# Patient Record
Sex: Male | Born: 1975
Health system: Southern US, Community
[De-identification: ages and names within clinical notes are randomized; demographics above are authoritative.]

## PROBLEM LIST (undated history)

## (undated) DIAGNOSIS — L409 Psoriasis, unspecified: Secondary | ICD-10-CM

## (undated) DIAGNOSIS — E7800 Pure hypercholesterolemia, unspecified: Secondary | ICD-10-CM

## (undated) DIAGNOSIS — E119 Type 2 diabetes mellitus without complications: Secondary | ICD-10-CM

## (undated) DIAGNOSIS — I2510 Atherosclerotic heart disease of native coronary artery without angina pectoris: Secondary | ICD-10-CM

## (undated) HISTORY — DX: Atherosclerotic heart disease of native coronary artery without angina pectoris: I25.10

## (undated) HISTORY — PX: SHOULDER SURGERY: SHX246

## (undated) HISTORY — DX: Psoriasis, unspecified: L40.9

---

## 2003-07-23 ENCOUNTER — Encounter: Admission: RE | Admit: 2003-07-23 | Discharge: 2003-07-23 | Payer: Self-pay | Admitting: Family Medicine

## 2011-03-25 ENCOUNTER — Ambulatory Visit: Payer: Worker's Compensation

## 2012-07-21 LAB — HM DIABETES EYE EXAM

## 2012-10-31 ENCOUNTER — Other Ambulatory Visit: Payer: Self-pay | Admitting: Occupational Medicine

## 2012-10-31 ENCOUNTER — Ambulatory Visit: Payer: Self-pay

## 2012-10-31 DIAGNOSIS — R52 Pain, unspecified: Secondary | ICD-10-CM

## 2013-07-28 ENCOUNTER — Ambulatory Visit: Payer: Self-pay | Admitting: *Deleted

## 2013-09-22 ENCOUNTER — Ambulatory Visit: Payer: Self-pay | Admitting: *Deleted

## 2013-11-30 ENCOUNTER — Emergency Department (INDEPENDENT_AMBULATORY_CARE_PROVIDER_SITE_OTHER)
Admission: EM | Admit: 2013-11-30 | Discharge: 2013-11-30 | Disposition: A | Discharge status: Home / Self Care | Payer: Worker's Compensation | Source: Home / Self Care | Attending: Family Medicine | Admitting: Family Medicine

## 2013-11-30 ENCOUNTER — Encounter (HOSPITAL_COMMUNITY): Payer: Self-pay | Admitting: Emergency Medicine

## 2013-11-30 ENCOUNTER — Emergency Department (INDEPENDENT_AMBULATORY_CARE_PROVIDER_SITE_OTHER): Payer: Worker's Compensation

## 2013-11-30 DIAGNOSIS — S99929A Unspecified injury of unspecified foot, initial encounter: Secondary | ICD-10-CM

## 2013-11-30 DIAGNOSIS — S99919A Unspecified injury of unspecified ankle, initial encounter: Secondary | ICD-10-CM

## 2013-11-30 DIAGNOSIS — S8990XA Unspecified injury of unspecified lower leg, initial encounter: Secondary | ICD-10-CM

## 2013-11-30 DIAGNOSIS — S8992XA Unspecified injury of left lower leg, initial encounter: Secondary | ICD-10-CM

## 2013-11-30 HISTORY — DX: Pure hypercholesterolemia, unspecified: E78.00

## 2013-11-30 HISTORY — DX: Type 2 diabetes mellitus without complications: E11.9

## 2013-11-30 MED ORDER — DICLOFENAC SODIUM 50 MG PO TBEC: 50.0000 mg | DELAYED_RELEASE_TABLET | Freq: Two times a day (BID) | ORAL | Status: DC | PRN

## 2013-11-30 NOTE — ED Provider Notes (Signed)
Todd Gaines is a 38 y.o. male who presents to Urgent Care today for left knee injury. Patient works for the city of [place] and was at work today when he attempted jump over a creek as part of his normal job duties. He landed awkwardly when the bank of the creek gave way and felt a pop in his left knee.  He continues to have mild pain and swelling of the left knee and a pop when he walks. He denies any locking or catching. He denies any radiating pain weakness or numbness.   Past Medical History  Diagnosis Date  . Diabetes mellitus without complication   . Elevated cholesterol    History  Substance Use Topics  . Smoking status: Never Smoker   . Smokeless tobacco: Not on file  . Alcohol Use: No   ROS as above Medications: No current facility-administered medications for this encounter.   Current Outpatient Prescriptions  Medication Sig Dispense Refill  . Atorvastatin Calcium (LIPITOR PO) Take by mouth.      . Ustekinumab (STELARA [place]) Inject into the skin.      Marland Kitchen diclofenac (VOLTAREN) 50 MG EC tablet Take 1 tablet (50 mg total) by mouth 2 (two) times daily as needed.  30 tablet  0    Exam:  BP 114/76  Pulse 82  Temp(Src) 97.9 F (36.6 C) (Oral)  Resp 18  SpO2 97% Gen: Well NAD HEENT: EOMI,  MMM Lungs: Normal work of breathing. CTABL Heart: RRR no MRG Abd: NABS, Soft. Nondistended, Nontender Exts: Brisk capillary refill, warm and well perfused.  Left knee: Normal-appearing no significant effusion. Range of motion 0-120 1+ right patellar crepitations on extension. Nontender. Stable ligamentous exam to anterior drawer, posterior drawer, valgus and varus stress testing. Negative medial and lateral McMurray's test.   No results found for this or any previous visit (from the past 24 hour(s)). Dg Knee Complete 4 Views Left  11/30/2013   CLINICAL DATA:  KNEE INJURY. The patient jumped over a creek. Pain in the knee. History of diabetes. Patient feels a pop in the knee.   EXAM: LEFT KNEE - COMPLETE 4+ VIEW  COMPARISON:  None.  FINDINGS: Small joint effusion is present. No acute fracture or dislocation. No radiopaque foreign body or soft tissue gas.  IMPRESSION: Small joint effusion.   Electronically Signed   By: Rosalie Gums M.D.   On: 11/30/2013 18:21    Assessment and Plan: 38 y.o. male with left knee injury occurring at work. Contusion versus meniscus injury. Rest watchful waiting followup with occupational health clinic.  Discussed warning signs or symptoms. Please see discharge instructions. Patient expresses understanding.     Rodolph Bong, MD 11/30/13 2014

## 2013-11-30 NOTE — ED Notes (Signed)
Reports trying to jump over creek bed and upon landing grassy area gave way.  States "left leg stayed planted while body fell forward, injuring knee".  Pain felt below knee with weight bearing.

## 2013-11-30 NOTE — Discharge Instructions (Signed)
Thank you for coming in today. Rest ice and elevation.  Take diclofenac as needed for pain Followup with occupational health clinic on Monday or Tuesday.    Knee Effusion The medical term for having fluid in your knee is effusion. This is often due to an internal derangement of the knee. This means something is wrong inside the knee. Some of the causes of fluid in the knee may be torn cartilage, a torn ligament, or bleeding into the joint from an injury. Your knee is likely more difficult to bend and move. This is often because there is increased pain and pressure in the joint. The time it takes for recovery from a knee effusion depends on different factors, including:   Type of injury.  Your age.  Physical and medical conditions.  Rehabilitation Strategies. How long you will be away from your normal activities will depend on what kind of knee problem you have and how much damage is present. Your knee has two types of cartilage. Articular cartilage covers the bone ends and lets your knee bend and move smoothly. Two menisci, thick pads of cartilage that form a rim inside the joint, help absorb shock and stabilize your knee. Ligaments bind the bones together and support your knee joint. Muscles move the joint, help support your knee, and take stress off the joint itself. CAUSES  Often an effusion in the knee is caused by an injury to one of the menisci. This is often a tear in the cartilage. Recovery after a meniscus injury depends on how much meniscus is damaged and whether you have damaged other knee tissue. Small tears may heal on their own with conservative treatment. Conservative means rest, limited weight bearing activity and muscle strengthening exercises. Your recovery may take up to 6 weeks.  TREATMENT  Larger tears may require surgery. Meniscus injuries may be treated during arthroscopy. Arthroscopy is a procedure in which your surgeon uses a small telescope like instrument to look in  your knee. Your caregiver can make a more accurate diagnosis (learning what is wrong) by performing an arthroscopic procedure. If your injury is on the inner margin of the meniscus, your surgeon may trim the meniscus back to a smooth rim. In other cases your surgeon will try to repair a damaged meniscus with stitches (sutures). This may make rehabilitation take longer, but may provide better long term result by helping your knee keep its shock absorption capabilities. Ligaments which are completely torn usually require surgery for repair. HOME CARE INSTRUCTIONS  Use crutches as instructed.  If a brace is applied, use as directed.  Once you are home, an ice pack applied to your swollen knee may help with discomfort and help decrease swelling.  Keep your knee raised (elevated) when you are not up and around or on crutches.  Only take over-the-counter or prescription medicines for pain, discomfort, or fever as directed by your caregiver.  Your caregivers will help with instructions for rehabilitation of your knee. This often includes strengthening exercises.  You may resume a normal diet and activities as directed. SEEK MEDICAL CARE IF:   There is increased swelling in your knee.  You notice redness, swelling, or increasing pain in your knee.  An unexplained oral temperature above 102 F (38.9 C) develops. SEEK IMMEDIATE MEDICAL CARE IF:   You develop a rash.  You have difficulty breathing.  You have any allergic reactions from medications you may have been given.  There is severe pain with any motion of the  knee. MAKE SURE YOU:   Understand these instructions.  Will watch your condition.  Will get help right away if you are not doing well or get worse. Document Released: 05/23/2003 Document Revised: 05/25/2011 Document Reviewed: 07/27/2007 [facility] Patient Information 2015 [place], [place]. This information is not intended to replace advice given to you by your health care  provider. Make sure you discuss any questions you have with your health care provider.

## 2013-12-15 LAB — HM DIABETES EYE EXAM

## 2014-04-18 ENCOUNTER — Ambulatory Visit (INDEPENDENT_AMBULATORY_CARE_PROVIDER_SITE_OTHER): Payer: 59 | Admitting: Family Medicine

## 2014-04-18 ENCOUNTER — Encounter: Payer: Self-pay | Admitting: Family Medicine

## 2014-04-18 ENCOUNTER — Encounter (INDEPENDENT_AMBULATORY_CARE_PROVIDER_SITE_OTHER): Payer: Self-pay

## 2014-04-18 VITALS — BP 120/76 | HR 78 | Temp 97.8°F | Ht 72.5 in | Wt 292.0 lb

## 2014-04-18 DIAGNOSIS — E1165 Type 2 diabetes mellitus with hyperglycemia: Secondary | ICD-10-CM | POA: Insufficient documentation

## 2014-04-18 DIAGNOSIS — E669 Obesity, unspecified: Secondary | ICD-10-CM

## 2014-04-18 DIAGNOSIS — L409 Psoriasis, unspecified: Secondary | ICD-10-CM | POA: Insufficient documentation

## 2014-04-18 DIAGNOSIS — M7552 Bursitis of left shoulder: Secondary | ICD-10-CM

## 2014-04-18 DIAGNOSIS — E785 Hyperlipidemia, unspecified: Secondary | ICD-10-CM

## 2014-04-18 DIAGNOSIS — M25562 Pain in left knee: Secondary | ICD-10-CM

## 2014-04-18 DIAGNOSIS — E1169 Type 2 diabetes mellitus with other specified complication: Secondary | ICD-10-CM | POA: Insufficient documentation

## 2014-04-18 DIAGNOSIS — IMO0002 Reserved for concepts with insufficient information to code with codable children: Secondary | ICD-10-CM | POA: Insufficient documentation

## 2014-04-18 DIAGNOSIS — Z23 Encounter for immunization: Secondary | ICD-10-CM

## 2014-04-18 DIAGNOSIS — E119 Type 2 diabetes mellitus without complications: Secondary | ICD-10-CM

## 2014-04-18 MED ORDER — MELOXICAM 15 MG PO TABS: 15.0000 mg | ORAL_TABLET | Freq: Every day | ORAL | Status: DC | PRN

## 2014-04-18 MED ORDER — LINAGLIPTIN-METFORMIN HCL 2.5-1000 MG PO TABS: 1.0000 | ORAL_TABLET | Freq: Every day | ORAL | Status: DC

## 2014-04-18 NOTE — Patient Instructions (Signed)
Labs today  Flu shot today  Keep working on diet and exercise for weight loss  If you want another evaluation of shoulder and knee pain - see Dr Patsy Lageropland in this office

## 2014-04-18 NOTE — Progress Notes (Signed)
Subjective:    Patient ID: Todd Gaines, male    DOB: [birth year], 39 y.o.   MRN: [id number]  HPI Here to get est for primary care   Went to [facility] med in the past   Has psoriasis  Joints hurt some (hand, ankles and knees)- ? If he has some assoc arthritis  Sees Dr Margo Aye for derm  stelara works fairly for him - gets inj every 3 mo - fairly well controlled  Has had it for 10 years   DM dx 2 years ago -  On jentadueto - which is linagliptin and metformin combined  Also on ace  Did have some microalbumin  Doing well over the past year except for a short period when he was off of it   Usually in 6-7 range   Last A1C was approx oct . --- he sees Dr Tally Joe  Thinks it was in the low 7s He did DM classes / nutritionist   Obese with bmi of 39  Has tried to loose wt - he lost wt down to 260s and then gained it back  Has not been to the gym in 2 mo - after he hurt his shoulder and knee ----has been to [facility] orth[facility] and saw Dr Eulah Pont Dx with bursitis of L shoulder  Also L knee- ? 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 What date  Tdap 2013    There are no active problems to display for this patient.  Past Medical History  Diagnosis Date  . Diabetes mellitus without complication   . Elevated cholesterol    No past surgical history on file. History  Substance Use Topics  . Smoking status: Never Smoker   . Smokeless tobacco: Not on file  . Alcohol Use: No   No family history on file. No Known Allergies Current Outpatient Prescriptions on File Prior to Visit  Medication Sig Dispense  Refill  . Atorvastatin Calcium (LIPITOR PO) Take by mouth.    . diclofenac (VOLTAREN) 50 MG EC tablet Take 1 tablet (50 mg total) by mouth 2 (two) times daily as needed. 30 tablet 0  . Ustekinumab (STELARA [place]) Inject into the skin.     No current facility-administered medications on file prior to visit.      Review of Systems    Review of Systems  Constitutional: Negative for fever, appetite change, fatigue and unexpected weight change.  Eyes: Negative for pain and visual disturbance.  Respiratory: Negative for cough and shortness of breath.   Cardiovascular: Negative for cp or palpitations    Gastrointestinal: Negative for nausea, diarrhea and constipation.  Genitourinary: Negative for urgency and frequency.  Skin: Negative for pallor or rash   MSK pos for L shoulder and L knee pain without swelling Neurological: Negative for weakness, light-headedness, numbness and headaches.  Hematological: Negative for adenopathy. Does not bruise/bleed easily.  Psychiatric/Behavioral: Negative for dysphoric mood. The patient is not nervous/anxious.      Objective:   Physical  Exam  Constitutional: He appears well-developed and well-nourished. No distress.  obese and well appearing   HENT:  Head: Normocephalic and atraumatic.  Right Ear: External ear normal.  Left Ear: External ear normal.  Nose: Nose normal.  Mouth/Throat: Oropharynx is clear and moist.  Eyes: Conjunctivae and EOM are normal. Pupils are equal, round, and reactive to light. Right eye exhibits no discharge. Left eye exhibits no discharge. No scleral icterus.  Neck: Normal range of motion. Neck supple. No JVD present. Carotid bruit is not present. No thyromegaly present.  Cardiovascular: Normal rate, regular rhythm, normal heart sounds and intact distal pulses.  Exam reveals no gallop.   Pulmonary/Chest: Effort normal and breath sounds normal. No respiratory distress. He has no wheezes. He exhibits no tenderness.  Abdominal:  Soft. Bowel sounds are normal. He exhibits no distension, no abdominal bruit and no mass. There is no tenderness.  Musculoskeletal: He exhibits tenderness. He exhibits no edema.  Limited rom of L shoulder-cannot abduct over 90 deg Tender acromion area   Lymphadenopathy:    He has no cervical adenopathy.  Neurological: He is alert. He has normal reflexes. No cranial nerve deficit. He exhibits normal muscle tone. Coordination normal.  Skin: Skin is warm and dry. No rash noted. No erythema. No pallor.  No psoriasis plaques evident today  Psychiatric: He has a normal mood and affect.          Assessment & Plan:   Problem List Items Addressed This Visit      Endocrine   Diabetes type 2, controlled - Primary    Taking over his care-prev went to [facility] Pt does not currently check bs but has been controlled He is not motivated to follow DM diet  Disc low glycemic diet and exercise  Nl foot exam  May need pneumovax, will send for records  Lab today      Relevant Medications   lisinopril (PRINIVIL,ZESTRIL) 10 MG tablet   Linagliptin-Metformin HCl 2.07-998 MG TABS   Other Relevant Orders   Hemoglobin A1c (Completed)     Musculoskeletal and Integument   Bursitis of left shoulder    Pt unsure if he will return to orthopedics rom is moderately limited  He will consider seeing sport med Dr Patsy Lager      Psoriasis    Sees dermatology Currently on stelara and doing well         Other   Hyperlipidemia    Lab today On atorvastatin  Diet is fair Disc goals for diabetic       Relevant Medications   lisinopril (PRINIVIL,ZESTRIL) 10 MG tablet   Other Relevant Orders   Comprehensive metabolic panel (Completed)   Lipid panel (Completed)   Left knee pain    Sounds like ? Patellofemoral synd Wt loss would help -also PT  He may see Dr Patsy Lager for this       Obesity    Discussed how this problem influences overall health and the risks it imposes  Reviewed plan for weight loss  with lower calorie diet (via better food choices and also portion control or program like weight watchers) and exercise building up to or more than 30 minutes 5 days per week including some aerobic activity         Relevant Medications   Linagliptin-Metformin HCl 2.07-998 MG TABS    Other Visit Diagnoses    Flu vaccine need        Relevant Orders    Flu Vaccine QUAD 36+ mos IM (  Completed)

## 2014-04-18 NOTE — Progress Notes (Signed)
Pre visit review using our clinic review tool, if applicable. No additional management support is needed unless otherwise documented below in the visit note. 

## 2014-04-19 DIAGNOSIS — E669 Obesity, unspecified: Secondary | ICD-10-CM | POA: Insufficient documentation

## 2014-04-19 LAB — COMPREHENSIVE METABOLIC PANEL
ALT: 10 U/L (ref 0–53)
AST: 11 U/L (ref 0–37)
Albumin: 4 g/dL (ref 3.5–5.2)
Alkaline Phosphatase: 88 U/L (ref 39–117)
BUN: 13 mg/dL (ref 6–23)
CO2: 28 mEq/L (ref 19–32)
Calcium: 9.2 mg/dL (ref 8.4–10.5)
Chloride: 98 mEq/L (ref 96–112)
Creatinine, Ser: 0.99 mg/dL (ref 0.40–1.50)
GFR: 89.54 mL/min (ref >60.00)
Glucose, Bld: 317 mg/dL — ABNORMAL HIGH (ref 70–99)
Potassium: 5.1 mEq/L (ref 3.5–5.1)
Sodium: 134 mEq/L — ABNORMAL LOW (ref 135–145)
Total Bilirubin: 0.4 mg/dL (ref 0.2–1.2)
Total Protein: 6.6 g/dL (ref 6.0–8.3)

## 2014-04-19 LAB — LIPID PANEL
Cholesterol: 104 mg/dL (ref 0–200)
HDL: 27.2 mg/dL — ABNORMAL LOW (ref >39.00)
LDL Cholesterol: 42 mg/dL (ref 0–99)
NonHDL: 76.8
Total CHOL/HDL Ratio: 4
Triglycerides: 172 mg/dL — ABNORMAL HIGH (ref 0.0–149.0)
VLDL: 34.4 mg/dL (ref 0.0–40.0)

## 2014-04-19 LAB — HEMOGLOBIN A1C: Hgb A1c MFr Bld: 11 % — ABNORMAL HIGH (ref 4.6–6.5)

## 2014-04-19 NOTE — Assessment & Plan Note (Signed)
Pt unsure if he will return to orthopedics rom is moderately limited  He will consider seeing sport med Dr Patsy Lageropland

## 2014-04-19 NOTE — Assessment & Plan Note (Signed)
Discussed how this problem influences overall health and the risks it imposes  Reviewed plan for weight loss with lower calorie diet (via better food choices and also portion control or program like weight watchers) and exercise building up to or more than 30 minutes 5 days per week including some aerobic activity    

## 2014-04-19 NOTE — Assessment & Plan Note (Signed)
Sounds like ? Patellofemoral synd Wt loss would help -also PT  He may see Dr Patsy Lageropland for this

## 2014-04-19 NOTE — Assessment & Plan Note (Signed)
Sees dermatology Currently on stelara and doing well

## 2014-04-19 NOTE — Assessment & Plan Note (Signed)
Taking over his care-prev went to [facility] Pt does not currently check bs but has been controlled He is not motivated to follow DM diet  Disc low glycemic diet and exercise  Nl foot exam  May need pneumovax, will send for records  Lab today

## 2014-04-19 NOTE — Assessment & Plan Note (Signed)
Lab today On atorvastatin  Diet is fair Disc goals for diabetic

## 2014-04-20 ENCOUNTER — Telehealth: Payer: Self-pay | Admitting: Family Medicine

## 2014-04-20 NOTE — Telephone Encounter (Signed)
Pt stopped by and stated he was seen earlier in the week and his work requires a drs note. Please advise. Thank you!

## 2014-04-20 NOTE — Telephone Encounter (Signed)
Notified patient and note left in front office.

## 2014-04-20 NOTE — Telephone Encounter (Signed)
Note done and in IN box 

## 2014-04-23 ENCOUNTER — Encounter: Payer: Self-pay | Admitting: *Deleted

## 2014-04-26 ENCOUNTER — Other Ambulatory Visit: Payer: Self-pay

## 2014-04-26 MED ORDER — ONETOUCH ULTRA 2 W/DEVICE KIT: PACK | Status: DC

## 2014-04-26 MED ORDER — ONETOUCH DELICA LANCETS FINE MISC: Status: DC

## 2014-04-26 MED ORDER — GLUCOSE BLOOD VI STRP: ORAL_STRIP | Status: DC

## 2014-04-26 NOTE — Telephone Encounter (Signed)
Mr Todd Gaines has old one touch diabetic meter and pt request new one touch 2 meter, test strips and lancets to [facility] [place]. Advised done.

## 2014-06-25 ENCOUNTER — Other Ambulatory Visit: Payer: Self-pay

## 2014-06-25 NOTE — Telephone Encounter (Signed)
Pt request refill on lisinopril 10 mg and atorvastatin 10 mg taking one daily to [facility] ; Dr Milinda Antisower has not filled before; pt last seen 04/18/14.Marland Kitchen. Pt also request linagliptin-metformin and meloxicam to [facility] but spoke with [facility] and have refills for these 2 meds and will get ready for pickup.

## 2014-06-25 NOTE — Telephone Encounter (Signed)
He needs a f/u - his last A1C was 11 and we were unable to get a hold of him to follow up for this -- see note that went with last lab  Perhaps the pharmacy knows a better number to reach him Please refill the lisinopril and atorvastatin times one and schedule f/u Thanks

## 2014-06-26 MED ORDER — LISINOPRIL 10 MG PO TABS: 10.0000 mg | ORAL_TABLET | Freq: Every day | ORAL | Status: DC

## 2014-07-25 ENCOUNTER — Encounter: Payer: Self-pay | Admitting: Family Medicine

## 2014-07-25 ENCOUNTER — Ambulatory Visit (INDEPENDENT_AMBULATORY_CARE_PROVIDER_SITE_OTHER): Payer: 59 | Admitting: Family Medicine

## 2014-07-25 VITALS — BP 120/70 | HR 83 | Temp 97.9°F | Ht 72.5 in | Wt 292.5 lb

## 2014-07-25 DIAGNOSIS — M7502 Adhesive capsulitis of left shoulder: Secondary | ICD-10-CM

## 2014-07-25 NOTE — Progress Notes (Signed)
Pre visit review using our clinic review tool, if applicable. No additional management support is needed unless otherwise documented below in the visit note. 

## 2014-07-25 NOTE — Progress Notes (Signed)
Dr. Frederico Hamman T. Carmisha Larusso, MD, [facility] [place], [address] Phone: [phone] Fax: [phone]  07/25/2014  Patient: Todd Gaines, MRN: [id number], DOB: 26-May-1975, 39 y.o.  Primary Physician:  Loura Pardon, MD  Chief Complaint: Shoulder Pain  Subjective:   Todd Gaines is a 39 y.o. very pleasant male patient who presents with the following: shoulder pain  Consulting MD: Dr. Glori Bickers Reason: L shoulder pain  The patient noted above presents with L shoulder pain that has been ongoing for 1 year there is no history of trauma or accident. The patient denies neck pain or radicular symptoms. No shoulder blade pain Denies dislocation, subluxation, separation of the shoulder. The patient does complain of pain with flexion, abduction, and terminal motion.  Significant restriction of motion. he describes a deep ache around the shoulder, and sometimes it will wake the patient up at night.  Medications Tried: Mobic, Shoulder injection Ice or Heat: minimal help Tried PT: No  Prior shoulder Injury: No Prior surgery: No Prior fracture: No  Last year, the patient saw Dr. Percell Miller, and he thought that the patient had subacromial bursitis, did a subacromial injection and gave him some Mobic. The patient had relief of symptoms for 2 days.  Past Medical History, Surgical History, Social History, Family History, Medications, and allergies reviewed and updated if relevant.   GEN: No fevers, chills. Nontoxic. Primarily MSK c/o today. MSK: Detailed in the HPI GI: tolerating PO intake without difficulty Neuro: No numbness, parasthesias, or tingling associated. Otherwise the pertinent positives of the ROS are noted above.    Objective:   Blood pressure 120/70, pulse 83, temperature 97.9 F (36.6 C), temperature source Oral, height 6' 0.5" (1.842 m), weight 292 lb 8 oz (132.677 kg).  GEN: Well-developed,well-nourished,in no acute  distress; alert,appropriate and cooperative throughout examination HEENT: Normocephalic and atraumatic without obvious abnormalities. Ears, externally no deformities PULM: Breathing comfortably in no respiratory distress EXT: No clubbing, cyanosis, or edema PSYCH: Normally interactive. Cooperative during the interview. Pleasant. Friendly and conversant. Not anxious or depressed appearing. Normal, full affect.  Shoulder: L Inspection: No muscle wasting or winging Ecchymosis/edema: neg  AC joint, scapula, clavicle: NT Cervical spine: NT, full ROM Spurling's: neg Abduction: 5/5, Loss of 15 deg Flexion: 5/5, loss of 10 deg IR, full, lift-off: 5/5, loss of 65 deg ER at neutral: 5/5, loss of 20 deg AC crossover and compression: unable to complete Additional special testing is equivocal given lack of motion C5-T1 intact Sensation intact Grip 5/5  Assessment and Plan:   Adhesive capsulitis of left shoulder  Primary driver of pain now is frozen shoulder. He likely did have an initial bursitis with rotator cuff tendinopathy and then developed a secondary frozen shoulder.  Patient was given a systematic ROM protocol from [employer] to be done daily. Emphasized importance of adherence.  The average length of total symptoms is 12 months going through 3 different phases in the freezing and thawing process. Reviewed all with patient.   Tylenol or NSAID of choice prn for pain relief Intraarticular shoulder injections discussed with patient, which have good evidence for accelerating the thawing phase, but given age and prior injection, I would hold off for now..  Will need RTC str and scapular stabilization to fix underlying mechanics.  I appreciate the opportunity to evaluate this very friendly patient. If you have any question regarding her care or prognosis, do not hesitate to ask.   Follow-up: 2 mo  Signed,  Maud Deed. Juell Radney, MD   Patient's Medications  New Prescriptions   No  medications on file  Previous Medications   ATORVASTATIN CALCIUM (LIPITOR PO)    Take by mouth.   BLOOD GLUCOSE MONITORING SUPPL (ONE TOUCH ULTRA 2) W/DEVICE KIT    Check fasting blood sugar and 2 hour post prandial BS in afternoon or evening. E11.9.   GLUCOSE BLOOD TEST STRIP    Check fasting blood sugar and 2 hour post prandial in afternoon or evening. Dx E11.9.   LINAGLIPTIN-METFORMIN HCL 2.07-998 MG TABS    Take 1 tablet by mouth daily.   LISINOPRIL (PRINIVIL,ZESTRIL) 10 MG TABLET    Take 1 tablet (10 mg total) by mouth daily.   MELOXICAM (MOBIC) 15 MG TABLET    Take 1 tablet (15 mg total) by mouth daily as needed for pain (with food).   ONETOUCH DELICA LANCETS FINE MISC    Check fasting blood sugar and 2 hour post prandial in afternoon or evening. E11.9   USTEKINUMAB (STELARA [place])    Inject into the skin.  Modified Medications   No medications on file  Discontinued Medications   No medications on file

## 2014-07-26 ENCOUNTER — Ambulatory Visit: Payer: Self-pay | Admitting: Family Medicine

## 2014-09-10 ENCOUNTER — Ambulatory Visit (INDEPENDENT_AMBULATORY_CARE_PROVIDER_SITE_OTHER): Payer: Commercial Managed Care - HMO | Admitting: Family Medicine

## 2014-09-10 ENCOUNTER — Telehealth: Payer: Self-pay | Admitting: Family Medicine

## 2014-09-10 ENCOUNTER — Encounter: Payer: Self-pay | Admitting: Family Medicine

## 2014-09-10 ENCOUNTER — Encounter: Payer: Self-pay | Admitting: *Deleted

## 2014-09-10 VITALS — BP 118/70 | HR 89 | Temp 97.5°F | Ht 72.5 in | Wt 287.0 lb

## 2014-09-10 DIAGNOSIS — M542 Cervicalgia: Secondary | ICD-10-CM

## 2014-09-10 DIAGNOSIS — E119 Type 2 diabetes mellitus without complications: Secondary | ICD-10-CM | POA: Diagnosis not present

## 2014-09-10 LAB — HEMOGLOBIN A1C: Hgb A1c MFr Bld: 9 % — ABNORMAL HIGH (ref 4.6–6.5)

## 2014-09-10 MED ORDER — TIZANIDINE HCL 4 MG PO TABS: 4.0000 mg | ORAL_TABLET | Freq: Every evening | ORAL | Status: DC

## 2014-09-10 NOTE — Progress Notes (Signed)
Dr. Frederico Hamman T. Massey Ruhland, MD, [facility] [place], [address] Phone: [phone] Fax: [phone]  09/10/2014  Patient: Todd Gaines, MRN: [id number], DOB: 11/11/1975, 39 y.o.  Primary Physician:  Loura Pardon, MD  Chief Complaint: Neck Pain  Subjective:   Todd Gaines is a 39 y.o. very pleasant male patient who presents with the following:  Pleasant 39 year old patient of Dr. Marliss Coots, who I'm seeing for cervicalgia, left posterior neck pain as well as shoulder blade pain today acutely.  I saw him previously for frozen shoulder on the left, and this is improved dramatically since I previously saw him.  Now is having pain in the posterior left side of his neck musculature, and having some mild restriction in range of motion.  He also is having some pain in and around the shoulder blade and parascapular region on the left.  He is not having any numbness or tingling radiating down his left arm.  Poorly controlled diabetic.  Due for his A1c today.  Unsure how and thinks tht strained something and pain and posterior shoulder.   Frozen shoulder is improving a lot.  (LEFT)  Neck posterior, L shoulder blade  Past Medical History, Surgical History, Social History, Family History, Problem List, Medications, and Allergies have been reviewed and updated if relevant.  Patient Active Problem List   Diagnosis Date Noted  . Obesity 04/19/2014  . Diabetes type 2, controlled 04/18/2014  . Hyperlipidemia 04/18/2014  . Psoriasis 04/18/2014  . Bursitis of left shoulder 04/18/2014  . Left knee pain 04/18/2014    Past Medical History  Diagnosis Date  . Diabetes mellitus without complication   . Elevated cholesterol     No past surgical history on file.  History   Social History  . Marital Status: Married    Spouse Name: N/A  . Number of Children: N/A  . Years of Education: N/A   Occupational History  . Not on file.    Social History Main Topics  . Smoking status: Former Research scientist (life sciences)  . Smokeless tobacco: Never Used  . Alcohol Use: No  . Drug Use: No  . Sexual Activity: Yes   Other Topics Concern  . Not on file   Social History Narrative    No family history on file.  No Known Allergies  Medication list reviewed and updated in full in [facility].   GEN: No acute illnesses, no fevers, chills. GI: No n/v/d, eating normally Pulm: No SOB Interactive and getting along well at home.  Otherwise, ROS is as per the HPI.  Objective:   BP 118/70 mmHg  Pulse 89  Temp(Src) 97.5 F (36.4 C) (Oral)  Ht 6' 0.5" (1.842 m)  Wt 287 lb (130.182 kg)  BMI 38.37 kg/m2  GEN: WDWN, NAD, Non-toxic, A & O x 3 HEENT: Atraumatic, Normocephalic. Neck supple. No masses, No LAD. Ears and Nose: No external deformity. CV: RRR, No M/G/R. No JVD. No thrill. No extra heart sounds. PULM: CTA B, no wheezes, crackles, rhonchi. No retractions. No resp. distress. No accessory muscle use. EXTR: No c/c/e NEURO Normal gait.  PSYCH: Normally interactive. Conversant. Not depressed or anxious appearing.  Calm demeanor.   CERVICAL SPINE EXAM Range of motion: Flexion, extension, lateral bending, and rotation: mild restriction Pain with terminal motion: yes Spinous Processes: NT SCM: NT Upper paracervical muscles: ttp on L Upper traps: TTP on L C5-T1 intact, sensation and motor   Laboratory and Imaging  Data: Results for orders placed or performed in visit on 09/10/14  Hemoglobin A1c  Result Value Ref Range   Hgb A1c MFr Bld 9.0 (H) 4.6 - 6.5 %     Assessment and Plan:   Cervicalgia  Diabetes type 2, controlled - Plan: Hemoglobin A1c  Referred neck pain with posterior neck pain, muscle tightness, trapezius spasm and referred pain to the shoulder blade all on the left.  Heat, gentle range of motion, and given neck range of motion and rehabilitation protocol.  Continue NSAIDs, and at Zanaflex at  nighttime.  A1c is at 9, he is supposed to have follow-up with his primary care doctor soon.  Follow-up: No Follow-up on file.  New Prescriptions   ATORVASTATIN (LIPITOR) 10 MG TABLET    Take 1 tablet (10 mg total) by mouth daily.   TIZANIDINE (ZANAFLEX) 4 MG TABLET    Take 1 tablet (4 mg total) by mouth Nightly.   Orders Placed This Encounter  Procedures  . Hemoglobin A1c    Signed,  Keyana Guevara T. Viet Kemmerer, MD   Patient's Medications  New Prescriptions   ATORVASTATIN (LIPITOR) 10 MG TABLET    Take 1 tablet (10 mg total) by mouth daily.   TIZANIDINE (ZANAFLEX) 4 MG TABLET    Take 1 tablet (4 mg total) by mouth Nightly.  Previous Medications   BLOOD GLUCOSE MONITORING SUPPL (ONE TOUCH ULTRA 2) W/DEVICE KIT    Check fasting blood sugar and 2 hour post prandial BS in afternoon or evening. E11.9.   GLUCOSE BLOOD TEST STRIP    Check fasting blood sugar and 2 hour post prandial in afternoon or evening. Dx E11.9.   LISINOPRIL (PRINIVIL,ZESTRIL) 10 MG TABLET    Take 1 tablet (10 mg total) by mouth daily.   ONETOUCH DELICA LANCETS FINE MISC    Check fasting blood sugar and 2 hour post prandial in afternoon or evening. E11.9   USTEKINUMAB (STELARA [place])    Inject into the skin.  Modified Medications   Modified Medication Previous Medication   LINAGLIPTIN-METFORMIN HCL 2.07-998 MG TABS Linagliptin-Metformin HCl 2.07-998 MG TABS      Take 1 tablet by mouth 2 (two) times daily.    Take 1 tablet by mouth daily.   MELOXICAM (MOBIC) 15 MG TABLET meloxicam (MOBIC) 15 MG tablet      Take 1 tablet (15 mg total) by mouth daily as needed for pain (with food).    Take 1 tablet (15 mg total) by mouth daily as needed for pain (with food).  Discontinued Medications   ATORVASTATIN CALCIUM (LIPITOR PO)    Take by mouth.

## 2014-09-10 NOTE — Progress Notes (Signed)
Pre visit review using our clinic review tool, if applicable. No additional management support is needed unless otherwise documented below in the visit note. 

## 2014-09-10 NOTE — Telephone Encounter (Signed)
Pt has 1 month supply on meds and it is cheaper with insurance to get 3 month supply using the same drug store. [facility] [place] to 3 month supply each.  Call back [phone]

## 2014-09-11 MED ORDER — MELOXICAM 15 MG PO TABS: 15.0000 mg | ORAL_TABLET | Freq: Every day | ORAL | Status: DC | PRN

## 2014-09-11 MED ORDER — LINAGLIPTIN-METFORMIN HCL 2.5-1000 MG PO TABS: 1.0000 | ORAL_TABLET | Freq: Two times a day (BID) | ORAL | Status: DC

## 2014-09-11 MED ORDER — ATORVASTATIN CALCIUM 10 MG PO TABS: 10.0000 mg | ORAL_TABLET | Freq: Every day | ORAL | Status: DC

## 2014-09-11 NOTE — Telephone Encounter (Signed)
meds sent in as 3 month supply per pt request and pharmacy advise to cancel the 30 day supply Rx on file

## 2014-09-26 ENCOUNTER — Ambulatory Visit: Payer: Commercial Managed Care - HMO | Admitting: Family Medicine

## 2014-09-28 ENCOUNTER — Ambulatory Visit (INDEPENDENT_AMBULATORY_CARE_PROVIDER_SITE_OTHER): Payer: Commercial Managed Care - HMO | Admitting: Family Medicine

## 2014-09-28 ENCOUNTER — Encounter: Payer: Self-pay | Admitting: Family Medicine

## 2014-09-28 VITALS — BP 100/70 | HR 94 | Temp 97.8°F | Wt 285.4 lb

## 2014-09-28 DIAGNOSIS — E669 Obesity, unspecified: Secondary | ICD-10-CM | POA: Diagnosis not present

## 2014-09-28 DIAGNOSIS — Z23 Encounter for immunization: Secondary | ICD-10-CM

## 2014-09-28 DIAGNOSIS — E119 Type 2 diabetes mellitus without complications: Secondary | ICD-10-CM

## 2014-09-28 NOTE — Patient Instructions (Signed)
Diabetes is improved (good job so far)  Keep working on diet and exercise and weight loss the best you can  Stop at check out for referral to endcrinology  Pneumonia vaccine today   A1C is 9.0- goal is below 7   I'm not sure when your glucose levels spike- check it at different times   Pneumonia vaccine today

## 2014-09-28 NOTE — Assessment & Plan Note (Signed)
Improved but not controlled  A1C is 9 - down from 11  He is making more of a lifestyle effort  Currently on Trajenta-metformin and tolerates this  Rev low glycemic diet He would like to avoid insulin if poss Ref to endocrinology for help with management

## 2014-09-28 NOTE — Progress Notes (Signed)
Subjective:    Patient ID: Todd Gaines, male    DOB: February 15, 1976, 39 y.o.   MRN: [id number]  HPI Here for f/u of DM  Wt is down 2 lb with bmi of 38   Diabetes Home sugar results - checks in am and sometimes in afternoons  Usually mid 150-mid 180s DM diet -not motivated to change her diet (has been a little better with it) - has been to DM teaching  Exercise - he is attempting to start back with this -getting back to the gym (4-5 d per week -then his daughter started travel softball and schedule went haywire) Symptoms A1C last  Lab Results  Component Value Date   HGBA1C 9.0* 09/10/2014  it was 11, so this is improved   No problems with medications - he takes combo of Tradjenta -metformin (does not miss doses) - no side effects or problems  Has not been on injectable meds or insulin  Renal protection -on ace  Last eye exam -fall 2015- October , no retinopathy    Needs pneumovax -  BP Readings from Last 3 Encounters:  09/28/14 100/70  09/10/14 118/70  07/25/14 120/70     Patient Active Problem List   Diagnosis Date Noted  . Obesity 04/19/2014  . Diabetes type 2, controlled 04/18/2014  . Hyperlipidemia 04/18/2014  . Psoriasis 04/18/2014  . Bursitis of left shoulder 04/18/2014  . Left knee pain 04/18/2014   Past Medical History  Diagnosis Date  . Diabetes mellitus without complication   . Elevated cholesterol    No past surgical history on file. History  Substance Use Topics  . Smoking status: Former Games developer  . Smokeless tobacco: Never Used  . Alcohol Use: No   No family history on file. No Known Allergies Current Outpatient Prescriptions on File Prior to Visit  Medication Sig Dispense Refill  . atorvastatin (LIPITOR) 10 MG tablet Take 1 tablet (10 mg total) by mouth daily. 90 tablet 2  . Blood Glucose Monitoring Suppl (ONE TOUCH ULTRA 2) W/DEVICE KIT Check fasting blood sugar and 2 hour post prandial BS in afternoon or evening. E11.9. 1 each 0  . glucose  blood test strip Check fasting blood sugar and 2 hour post prandial in afternoon or evening. Dx E11.9. 100 each 5  . Linagliptin-Metformin HCl 2.07-998 MG TABS Take 1 tablet by mouth 2 (two) times daily. 180 tablet 2  . lisinopril (PRINIVIL,ZESTRIL) 10 MG tablet Take 1 tablet (10 mg total) by mouth daily. 90 tablet 3  . meloxicam (MOBIC) 15 MG tablet Take 1 tablet (15 mg total) by mouth daily as needed for pain (with food). 90 tablet 2  . ONETOUCH DELICA LANCETS FINE MISC Check fasting blood sugar and 2 hour post prandial in afternoon or evening. E11.9 100 each 5  . tiZANidine (ZANAFLEX) 4 MG tablet Take 1 tablet (4 mg total) by mouth Nightly. 30 tablet 2  . Ustekinumab (STELARA [place]) Inject into the skin.     No current facility-administered medications on file prior to visit.    Review of Systems    Review of Systems  Constitutional: Negative for fever, appetite change, fatigue and unexpected weight change.  Eyes: Negative for pain and visual disturbance.  Respiratory: Negative for cough and shortness of breath.   Cardiovascular: Negative for cp or palpitations    Gastrointestinal: Negative for nausea, diarrhea and constipation.  Genitourinary: Negative for urgency and frequency. pos for big water consumption/thirst  Skin: Negative for pallor or rash  Neurological: Negative for weakness, light-headedness, numbness and headaches.  Hematological: Negative for adenopathy. Does not bruise/bleed easily.  Psychiatric/Behavioral: Negative for dysphoric mood. The patient is not nervous/anxious.      Objective:   Physical Exam  Constitutional: He appears well-developed and well-nourished. No distress.  obese and well appearing   HENT:  Head: Normocephalic and atraumatic.  Mouth/Throat: Oropharynx is clear and moist.  Eyes: Conjunctivae and EOM are normal. Pupils are equal, round, and reactive to light.  Neck: Normal range of motion. Neck supple. No JVD present. Carotid bruit is not present.  No thyromegaly present.  Cardiovascular: Normal rate, regular rhythm, normal heart sounds and intact distal pulses.  Exam reveals no gallop.   Pulmonary/Chest: Effort normal and breath sounds normal. No respiratory distress. He has no wheezes. He has no rales.  No crackles  Abdominal: Soft. Bowel sounds are normal. He exhibits no distension, no abdominal bruit and no mass. There is no tenderness.  Musculoskeletal: He exhibits no edema.  Lymphadenopathy:    He has no cervical adenopathy.  Neurological: He is alert. He has normal reflexes.  Skin: Skin is warm and dry. No erythema.  Psychiatric: He has a normal mood and affect.          Assessment & Plan:   Problem List Items Addressed This Visit    Diabetes type 2, controlled - Primary    Improved but not controlled  A1C is 9 - down from 11  He is making more of a lifestyle effort  Currently on Trajenta-metformin and tolerates this  Rev low glycemic diet He would like to avoid insulin if poss Ref to endocrinology for help with management       Relevant Orders   Ambulatory referral to Endocrinology   Obesity    Discussed how this problem influences overall health and the risks it imposes  Reviewed plan for weight loss with lower calorie diet (via better food choices and also portion control or program like weight watchers) and exercise building up to or more than 30 minutes 5 days per week including some aerobic activity          Other Visit Diagnoses    Need for 23-polyvalent pneumococcal polysaccharide vaccine        Relevant Orders    Pneumococcal polysaccharide vaccine 23-valent greater than or equal to 2yo subcutaneous/IM (Completed)

## 2014-09-28 NOTE — Progress Notes (Signed)
Pre visit review using our clinic review tool, if applicable. No additional management support is needed unless otherwise documented below in the visit note. 

## 2014-09-30 NOTE — Assessment & Plan Note (Signed)
Discussed how this problem influences overall health and the risks it imposes  Reviewed plan for weight loss with lower calorie diet (via better food choices and also portion control or program like weight watchers) and exercise building up to or more than 30 minutes 5 days per week including some aerobic activity    

## 2014-11-14 ENCOUNTER — Other Ambulatory Visit: Payer: Self-pay | Admitting: *Deleted

## 2014-11-14 ENCOUNTER — Encounter: Payer: Self-pay | Admitting: Endocrinology

## 2014-11-14 ENCOUNTER — Ambulatory Visit (INDEPENDENT_AMBULATORY_CARE_PROVIDER_SITE_OTHER): Payer: Commercial Managed Care - HMO | Admitting: Endocrinology

## 2014-11-14 VITALS — BP 144/84 | HR 92 | Temp 97.9°F | Resp 16 | Ht 72.0 in | Wt 287.8 lb

## 2014-11-14 DIAGNOSIS — E119 Type 2 diabetes mellitus without complications: Secondary | ICD-10-CM

## 2014-11-14 DIAGNOSIS — E785 Hyperlipidemia, unspecified: Secondary | ICD-10-CM

## 2014-11-14 LAB — POCT GLUCOSE (DEVICE FOR HOME USE): Glucose Fasting, POC: 368 mg/dL — AB (ref 70–99)

## 2014-11-14 MED ORDER — ALBIGLUTIDE 30 MG ~~LOC~~ PEN: PEN_INJECTOR | SUBCUTANEOUS | Status: DC

## 2014-11-14 NOTE — Patient Instructions (Addendum)
Check blood sugars on waking up .. 3 .. times a week Also check blood sugars about 2 hours after a meal and do this after different meals by rotation  Recommended blood sugar levels on waking up is 90-130 and about 2 hours after meal is 140-180 Please bring blood sugar monitor to each visit.  Tanzeum weekly, may cause a little nausea  Exercise regular  Avoid hi fat meals

## 2014-11-14 NOTE — Progress Notes (Signed)
Patient ID: Todd Gaines, male   DOB: [birth year], 39 y.o.   MRN: [id number]           Reason for Appointment: Consultation for Type 2 Diabetes  Referring physician: [facility]  History of Present Illness:          Date of diagnosis of type 2 diabetes mellitus: 2012       Background history:   He believes her A1c was 12% when he was first diagnosed to have diabetes and was having symptoms of feeling very tired. Initially he made changes in his lifestyle and was able to lose some weight also He was probably treated with metformin and may have been given Tradjenta subsequently Previous records are not available He thinks his A1c had been around 7-7.5% for some time until last year  Recent history:   Higher blood sugars have been present for the last 1 year and his A1c has been as high as 11%  He thinks his blood sugars started going up when he was starting to do some night shifts.    His last A1c was 9% in 6/16  Current blood sugar patterns and problems identified:  He is checking his blood sugars primarily in the morning and these are not very high   He does not watch his diet consistently and frequently is eating high-fat meals including pizza and biscuits; blood sugar is 368 today after eating a biscuit  Although he is fairly active at work unless the weather is not conducive he does not do any formal exercise and he says he does not find the time to do this.    Has not been able to lose any weight recently  Oral hypoglycemic drugs the patient is taking are: Jentadueto twice a day     Side effects from medications have been:none  Compliance with the medical regimen: poor    Glucose monitoring:  done  times a day         Glucometer: One Touch.      Blood Glucose readings recent fasting range 150-175, usually <200  Self-care: The diet that the patient has been following is: none, not motivated Typical meal intake: Breakfast is biscuit or eggs and bacon/grits.  Snacks will be  peanut butter crackers, chips, pretzels and peanut butter sweets              Dietician visit, most recent:2014               Exercise: Irregular   Weight history: 275-298 lbs  Wt Readings from Last 3 Encounters:  11/14/14 287 lb 12.8 oz (130.545 kg)  09/28/14 285 lb 6.4 oz (129.457 kg)  09/10/14 287 lb (130.182 kg)    Glycemic control:   Lab Results  Component Value Date   HGBA1C 9.0* 09/10/2014   HGBA1C 11.0* 04/18/2014   Lab Results  Component Value Date   LDLCALC 42 04/18/2014   CREATININE 0.99 04/18/2014         Medication List       This list is accurate as of: 11/14/14 12:44 PM.  Always use your most recent med list.               atorvastatin 10 MG tablet  Commonly known as:  LIPITOR  Take 1 tablet (10 mg total) by mouth daily.     glucose blood test strip  Check fasting blood sugar and 2 hour post prandial in afternoon or evening. Dx E11.9.     Linagliptin-Metformin  HCl 2.07-998 MG Tabs  Take 1 tablet by mouth 2 (two) times daily.     lisinopril 10 MG tablet  Commonly known as:  PRINIVIL,ZESTRIL  Take 1 tablet (10 mg total) by mouth daily.     meloxicam 15 MG tablet  Commonly known as:  MOBIC  Take 1 tablet (15 mg total) by mouth daily as needed for pain (with food).     ONE TOUCH ULTRA 2 W/DEVICE Kit  Check fasting blood sugar and 2 hour post prandial BS in afternoon or evening. E11.9.     ONETOUCH DELICA LANCETS FINE Misc  Check fasting blood sugar and 2 hour post prandial in afternoon or evening. E11.9     STELARA [place]  Inject into the skin.     tiZANidine 4 MG tablet  Commonly known as:  ZANAFLEX  Take 1 tablet (4 mg total) by mouth Nightly.        Allergies: No Known Allergies  Past Medical History  Diagnosis Date  . Diabetes mellitus without complication   . Elevated cholesterol   . Psoriasis     History reviewed. No pertinent past surgical history.  Family History  Problem Relation Age of Onset  . Diabetes Mother   .  Diabetes Paternal Grandfather   . Hypertension Paternal Grandfather   . Heart disease Neg Hx   . Cancer Neg Hx     Social History:  reports that he has quit smoking. He has never used smokeless tobacco. He reports that he does not drink alcohol or use illicit drugs.    Review of Systems    Lipid history: On Rx 3-4    Lab Results  Component Value Date   CHOL 104 04/18/2014   HDL 27.20* 04/18/2014   LDLCALC 42 04/18/2014   TRIG 172.0* 04/18/2014   CHOLHDL 4 04/18/2014           Constitutional: no recent weight gain/loss, no complaints of unusual fatigue   Eyes: no history of blurred vision.  Most recent eye exam was in 2/16  ENT: no nasal congestion, difficulty swallowing   Cardiovascular: no chest pain or tightness on exertion.  No leg swelling.  Hypertension:  Respiratory: no cough/shortness of breath  No history of daytime somnolence  Gastrointestinal: no constipation, diarrhea, nausea or abdominal pain  Musculoskeletal: no muscle/joint aches, occasionally feels stiff for has discomfort in knees and ankles   Urological:   No frequency of urination.  Has occasional nocturia. No erectile dysfunction  Skin: no infections, has been on treatment from dermatologist for long-standing psoriasis  Neurological: no headaches.  Has no numbness, burning, pains or tingling in feet    Psychiatric: no symptoms of depression  Endocrine: No unusual fatigue, cold intolerance or history of thyroid disease   LABS:  Office Visit on 11/14/2014  Component Date Value Ref Range Status  . Glucose Fasting, POC 11/14/2014 368* 70 - 99 mg/dL Final    Physical Examination:  BP 144/84 mmHg  Pulse 92  Temp(Src) 97.9 F (36.6 C)  Resp 16  Ht 6' (1.829 m)  Wt 287 lb 12.8 oz (130.545 kg)  BMI 39.02 kg/m2  SpO2 96%  GENERAL:         Patient has generalized obesity.   HEENT:         Eye exam shows normal external appearance. Fundus exam shows no retinopathy.  Oral exam shows  normal mucosa .  NECK:   There is no lymphadenopathy Thyroid is not enlarged and no nodules  felt.  Carotids are normal to palpation and no bruit heard LUNGS:         Chest is symmetrical. Lungs are clear to auscultation.Marland Kitchen   HEART:         Heart sounds:  S1 and S2 are normal. No murmur or click heard., no S3 or S4.   ABDOMEN:   There is no distention present. Liver and spleen are not palpable. No other mass or tenderness present.   NEUROLOGICAL:   Vibration sense is mildly reduced in distal first toes. Ankle jerks are 1+ bilaterally.          Diabetic foot exam shows normal monofilament sensation in the toes and plantar surfaces, no skin lesions or ulcers on the feet and normal pedal pulses MUSCULOSKELETAL:  There is no swelling or deformity of the peripheral joints. Spine is normal to inspection.   EXTREMITIES:     There is no edema. No skin lesions present.Marland Kitchen SKIN:       No rash or lesions of concern.        ASSESSMENT:  Diabetes type 2, uncontrolled with last A1c 9% He probably has fairly high readings after meals especially when he goes off his diet and not significantly high in the mornings when he monitors See history of present illness for detailed discussion of his current management, blood sugar patterns and problems identified He does need to do better with lifestyle management with improving his diet and starting an exercise program Also need to monitor readings after meals which he has not done so and he is not aware of his postprandial sugars  Since he is currently only on Jentadueto he is not having very effective control of his diabetes and probably has some progression of his diabetes also and this was discussed He is a good candidate for a GLP-1 drug which should help him with weight loss as well as improve diabetes based on various actions on diabetes physiology. He would find it more convenient to use a weekly GLP-1 to start with but Trulicity is not covered by his  insurance Discussed how GLP-1 drugs work as well as possible side effects  Complications:none evident  DYSLIPIDEMIA: Although his triglycerides are widely increased his HDL is significantly low and also LDL of only 42 He may benefit from using fenofibrate instead of Lipitor as he probably does not have a significantly high baseline LDL  ?  Hypertension: This is mild and well controlled usually, blood pressure relatively higher today  PLAN:    Have shown him how to use a Tanzeum pen device and he was started on 30 mg weekly  He will follow-up in 4 weeks and consider increasing the dose to 50 mg if tolerated and blood sugars are not at target  He can also be switched to once a day metformin instead of Jentadueto for better convenience  He will be scheduled to see the dietitian for meal planning, encouraged him to have his wife go with a  Regular exercise  Start monitoring and high fat meals  More blood sugars after meals and bring blood sugar monitor for download on each visit  Follow-up in one month  Patient Instructions  Check blood sugars on waking up .. 3 .. times a week Also check blood sugars about 2 hours after a meal and do this after different meals by rotation  Recommended blood sugar levels on waking up is 90-130 and about 2 hours after meal is 140-180 Please bring blood sugar monitor  to each visit.  Tanzeum weekly, may cause a little nausea  Exercise regular  Avoid hi fat meals     Anamarie Hunn 11/14/2014, 12:44 PM   Note: This office note was prepared with Estate agent. Any transcriptional errors that result from this process are unintentional.

## 2014-11-16 ENCOUNTER — Telehealth: Payer: Self-pay | Admitting: Endocrinology

## 2014-11-16 ENCOUNTER — Other Ambulatory Visit: Payer: Self-pay | Admitting: *Deleted

## 2014-11-16 MED ORDER — METFORMIN HCL ER 500 MG PO TB24: ORAL_TABLET | ORAL | Status: DC

## 2014-11-16 NOTE — Telephone Encounter (Signed)
Metformin ER 500 mg, 4 tablets daily 

## 2014-11-16 NOTE — Telephone Encounter (Signed)
Please advise what dose to send in, it's not listed in your notes.

## 2014-11-16 NOTE — Telephone Encounter (Signed)
Patient called stating that he saw Dr. Janna Arch and his plan was to take Metformin 1 time daily   Rx: metformin   [facility]    Thank you

## 2014-12-07 ENCOUNTER — Ambulatory Visit: Payer: Self-pay | Admitting: Endocrinology

## 2014-12-07 ENCOUNTER — Ambulatory Visit: Payer: Self-pay | Admitting: Dietician

## 2014-12-14 ENCOUNTER — Encounter: Payer: Self-pay | Admitting: Endocrinology

## 2014-12-14 ENCOUNTER — Other Ambulatory Visit: Payer: Self-pay | Admitting: *Deleted

## 2014-12-14 ENCOUNTER — Ambulatory Visit (INDEPENDENT_AMBULATORY_CARE_PROVIDER_SITE_OTHER): Payer: Commercial Managed Care - HMO | Admitting: Endocrinology

## 2014-12-14 VITALS — BP 124/86 | HR 95 | Temp 98.4°F | Resp 16 | Ht 72.0 in | Wt 282.6 lb

## 2014-12-14 DIAGNOSIS — E119 Type 2 diabetes mellitus without complications: Secondary | ICD-10-CM | POA: Diagnosis not present

## 2014-12-14 LAB — POCT GLYCOSYLATED HEMOGLOBIN (HGB A1C): Hemoglobin A1C: 9.9

## 2014-12-14 MED ORDER — INSULIN DEGLUDEC 200 UNIT/ML ~~LOC~~ SOPN: 30.0000 [IU] | PEN_INJECTOR | Freq: Every day | SUBCUTANEOUS | Status: DC

## 2014-12-14 MED ORDER — INSULIN PEN NEEDLE 32G X 4 MM MISC: Status: DC

## 2014-12-14 NOTE — Progress Notes (Signed)
Patient ID: Todd Gaines, male   DOB: 1975/04/09, 39 y.o.   MRN: [id number]           Reason for Appointment: Follow-up for Type 2 Diabetes  Referring physician: [facility]  History of Present Illness:          Date of diagnosis of type 2 diabetes mellitus: 2012       Background history:   He believes her A1c was 12% when he was first diagnosed to have diabetes and was having symptoms of feeling very tired. Initially he made changes in his lifestyle and was able to lose some weight also He was probably treated with metformin and may have been given Tradjenta subsequently Previous records are not available He thinks his A1c had been around 7-7.5% for some time until last year  Recent history:   Higher blood sugars have been present for the last 1 year and his A1c has been as high as 11%  His last A1c was 9% in 6/16 Because of inadequate control with Jentadueto he was given Tanzeum 30 mg weekly in addition to 2000 mg of metformin ER, started this at the beginning of this month A1c appears to be relatively higher  Current blood sugar patterns and problems identified:  He is checking his blood sugars still mostly in the morning especially recently   His blood sugars are looking higher than before and 10 to be further increase later in the day  He has only one blood sugar below 200 in the morning and he thinks this is from being very active the night before  His blood sugars are not looking better with adding Tanzeum; also he says that a couple of times a week he has some nausea which may also occur sometimes when he starts eating.  No side effects from metformin which he is taking all in the morning at breakfast.  No loose stools although stools are softer.  No abdominal pain  He still has not been seen by diabetes educator, was previously going off his diet and did not keep his appointment with dietitian a week ago  Although he is fairly active at work unless the weather is not  conducive he does not do any formal exercise and he says he does not find the time to do this.    Oral hypoglycemic drugs the patient is taking are: Jentadueto twice a day     Side effects from medications have been:none  Compliance with the medical regimen: Fair    Glucose monitoring:  done less than 1 times a day         Glucometer: One Touch.      Blood Glucose readings   Mean values apply above for all meters except median for One Touch  PRE-MEAL Fasting Lunch Dinner Bedtime Overall  Glucose range: 173-306   292  238-392     Mean/median:      245     Self-care: The diet that the patient has been following is: He is starting to do some walking and planning to do a 5 mile walk Typical meal intake: Breakfast is biscuit or eggs  Snacks will be peanut butter crackers, chips, pretzels and peanut butter sweets              Dietician visit, most recent:2014               Exercise: Irregular   Weight history: 275-298 lbs  Wt Readings from Last 3 Encounters:  12/14/14  282 lb 9.6 oz (128.187 kg)  11/14/14 287 lb 12.8 oz (130.545 kg)  09/28/14 285 lb 6.4 oz (129.457 kg)    Glycemic control:   Lab Results  Component Value Date   HGBA1C 9.9 12/14/2014   HGBA1C 9.0* 09/10/2014   HGBA1C 11.0* 04/18/2014   Lab Results  Component Value Date   LDLCALC 42 04/18/2014   CREATININE 0.99 04/18/2014         Medication List       This list is accurate as of: 12/14/14 10:56 AM.  Always use your most recent med list.               Albiglutide 30 MG Pen  Commonly known as:  TANZEUM  Inject contents of one pen once per week     atorvastatin 10 MG tablet  Commonly known as:  LIPITOR  Take 1 tablet (10 mg total) by mouth daily.     glucose blood test strip  Check fasting blood sugar and 2 hour post prandial in afternoon or evening. Dx E11.9.     Insulin Degludec 200 UNIT/ML Sopn  Commonly known as:  TRESIBA FLEXTOUCH  Inject 30 Units into the skin daily.     Insulin Pen  Needle 32G X 4 MM Misc  Commonly known as:  BD PEN NEEDLE NANO U/F  Use to inject insulin once a day     lisinopril 10 MG tablet  Commonly known as:  PRINIVIL,ZESTRIL  Take 1 tablet (10 mg total) by mouth daily.     meloxicam 15 MG tablet  Commonly known as:  MOBIC  Take 1 tablet (15 mg total) by mouth daily as needed for pain (with food).     metFORMIN 500 MG 24 hr tablet  Commonly known as:  GLUCOPHAGE XR  Take 4 tablets daily     ONE TOUCH ULTRA 2 W/DEVICE Kit  Check fasting blood sugar and 2 hour post prandial BS in afternoon or evening. E11.9.     ONETOUCH DELICA LANCETS FINE Misc  Check fasting blood sugar and 2 hour post prandial in afternoon or evening. E11.9     OTEZLA 10 & 20 & 30 MG Tbpk  Generic drug:  Apremilast  Take by mouth.     STELARA [place]  Inject into the skin.     tiZANidine 4 MG tablet  Commonly known as:  ZANAFLEX  Take 1 tablet (4 mg total) by mouth Nightly.        Allergies: No Known Allergies  Past Medical History  Diagnosis Date  . Diabetes mellitus without complication   . Elevated cholesterol   . Psoriasis     No past surgical history on file.  Family History  Problem Relation Age of Onset  . Diabetes Mother   . Diabetes Paternal Grandfather   . Hypertension Paternal Grandfather   . Heart disease Neg Hx   . Cancer Neg Hx     Social History:  reports that he has quit smoking. He has never used smokeless tobacco. He reports that he does not drink alcohol or use illicit drugs.    Review of Systems    Lipid history: On Rx 3-4    Lab Results  Component Value Date   CHOL 104 04/18/2014   HDL 27.20* 04/18/2014   LDLCALC 42 04/18/2014   TRIG 172.0* 04/18/2014   CHOLHDL 4 04/18/2014           Most recent eye exam was in 2/16   LABS:  Office Visit on 12/14/2014  Component Date Value Ref Range Status  . Hemoglobin A1C 12/14/2014 9.9   Final    Physical Examination:  BP 124/86 mmHg  Pulse 95  Temp(Src) 98.4 F (36.9  C)  Resp 16  Ht 6' (1.829 m)  Wt 282 lb 9.6 oz (128.187 kg)  BMI 38.32 kg/m2  SpO2 95%       ASSESSMENT:  Diabetes type 2, uncontrolled with last A1c 9% He still has poor control with using Tanzeum and metformin and does not appear to be benefiting from changing his previous regimen of Jentadueto Since he has had marked hyperglycemia for some time he probably has insulin deficiency and also glucose toxicity  Discussed principles of using basal insulin and how this would benefit him including improving glucose toxicity. Since he has mild nausea with Tanzeum may not be able to tolerate higher dose or another GLP-1 drug Also not clear if he has nausea from metformin which he is taking in the morning, 2000 mg  Hyperlipidemia: Will be assessed when blood sugars are controlled   PLAN:    Have shown him how to use a FlexPen for the [place] U-200 injection and he will be starting 16 units before breakfast daily  Discussed how to titrate the dose based on fasting blood sugars every 3 days  He will follow-up with her diabetes nurse in 10 days for more detailed education and adjustment of insulin if needed  Consider mealtime insulin also  Also may benefit from combining metformin with a drug like Invokana  Consistent exercise  More blood sugars after meals and bring blood sugar monitor for download on each visit  Follow-up in one month  Patient Instructions  Check blood sugars on waking up .Marland Kitchen 3-5 .Marland Kitchen times a week Also check blood sugars about 2 hours after a meal and do this after different meals by rotation  Recommended blood sugar levels on waking up is 90-130 and about 2 hours after meal is 140-180 Please bring blood sugar monitor to each visit.  Metformin 3 daily at dinner  TRESIBA insulin: This insulin provides blood sugar control for up to 24 hours.  Start with 16 units in am daily and increase by 2 units every 3 days until the waking up sugars are under 140.   Then  continue the same dose. If blood sugar is under 90 for 2 days in a row, reduce the dose by 2 units. Note that this insulin does not control the rise of blood sugar with meals      Counseling time on subjects discussed above is over 50% of today's 25 minute visit  KUMAR,AJAY 12/14/2014, 10:56 AM   Note: This office note was prepared with Estate agent. Any transcriptional errors that result from this process are unintentional.

## 2014-12-14 NOTE — Patient Instructions (Addendum)
Check blood sugars on waking up .Marland Kitchen 3-5 .Marland Kitchen times a week Also check blood sugars about 2 hours after a meal and do this after different meals by rotation  Recommended blood sugar levels on waking up is 90-130 and about 2 hours after meal is 140-180 Please bring blood sugar monitor to each visit.  Metformin 3 daily at dinner  TRESIBA insulin: This insulin provides blood sugar control for up to 24 hours.  Start with 16 units in am daily and increase by 2 units every 3 days until the waking up sugars are under 140.   Then continue the same dose. If blood sugar is under 90 for 2 days in a row, reduce the dose by 2 units. Note that this insulin does not control the rise of blood sugar with meals

## 2014-12-17 ENCOUNTER — Other Ambulatory Visit: Payer: Self-pay | Admitting: *Deleted

## 2014-12-17 MED ORDER — INSULIN GLARGINE 100 UNIT/ML SOLOSTAR PEN: 30.0000 [IU] | PEN_INJECTOR | Freq: Every day | SUBCUTANEOUS | Status: DC

## 2014-12-28 ENCOUNTER — Encounter: Payer: Commercial Managed Care - HMO | Attending: Endocrinology | Admitting: Dietician

## 2014-12-28 ENCOUNTER — Encounter: Payer: Self-pay | Admitting: Dietician

## 2014-12-28 VITALS — Ht 74.0 in | Wt 290.0 lb

## 2014-12-28 DIAGNOSIS — E119 Type 2 diabetes mellitus without complications: Secondary | ICD-10-CM

## 2014-12-28 DIAGNOSIS — Z713 Dietary counseling and surveillance: Secondary | ICD-10-CM | POA: Diagnosis not present

## 2014-12-28 DIAGNOSIS — Z794 Long term (current) use of insulin: Secondary | ICD-10-CM | POA: Diagnosis not present

## 2014-12-28 NOTE — Patient Instructions (Signed)
Avoid drinking beverages with carbohydrate. Get into an exercise habit.  Aim for 30 minutes most days of the week. Include more non-starchy vegetables. Aim for 4 Carb Choices per meal (60 grams) +/- 1 either way  Aim for 0-2 Carbs per snack if hungry  Include protein in moderation with your meals and snacks Consider reading food labels for Total Carbohydrate and Fat Grams of foods Consider checking BG at alternate times per day as directed by MD  Consider taking medication as directed by MD

## 2014-12-28 NOTE — Progress Notes (Signed)
Diabetes Self-Management Education  Visit Type: First/Initial  Appt. Start Time: 0900 Appt. End Time: 1015  12/28/2014  [name], identified by name and date of birth, is a 39 y.o. male with a diagnosis of Diabetes: Type 2.   Patient is here alone.  He lives with his wife and 509 yo daughter. They share the shopping and cooking.  He has been diagnosed with diabetes since 2012 with HgbA1C of 12% at that time.  He reports that he made a lot of changes initially, lost to 270 lbs and A1C decreased to around 7%.  He went from 7 bottles of regular soda per day to one and exercised regularly as well as being active with his job (walking and lifting).  Negative changes started about 1 1/2 years ago when he worked nights for a time.  He works for the [employer] on [employer] from 6;30-5:00 4 days per week.  Daughter is now in softball and time is further limited and meals are late.  Cholesterol 104, triglycerides 172, HDL 27, LDL 42 (04/18/14).   ASSESSMENT  Height 6\' 2"  (1.88 m), weight 290 lb (131.543 kg). Body mass index is 37.22 kg/(m^2).      Diabetes Self-Management Education - 12/28/14 0925    Visit Information   Visit Type First/Initial   Initial Visit   Diabetes Type Type 2   Are you currently following a meal plan? No   Are you taking your medications as prescribed? Yes   Date Diagnosed 2012   Health Coping   How would you rate your overall health? Good   Psychosocial Assessment   Patient Belief/Attitude about Diabetes Motivated to manage diabetes   Self-care barriers None   Self-management support Doctor's office;Family   Other persons present Patient   Patient Concerns Nutrition/Meal planning;Weight Control;Glycemic Control   Preferred Learning Style No preference indicated   Learning Readiness Ready   How often do you need to have someone help you when you read instructions, pamphlets, or other written materials from your doctor or pharmacy? 1 - Never   What is the last grade level you completed in school? some college   Complications   Last HgB A1C per patient/outside source 9.9 %  9.9% (12/14/14), 9% (09/10/14), 11 (04/18/2014)   How often do you check your blood sugar? 1-2 times/day  every other day and switch times   Fasting Blood glucose range (mg/dL) [id number]   Postprandial Blood glucose range (mg/dL) >086>200   Number of hypoglycemic episodes per month 0   Number of hyperglycemic episodes per week 14   Can you tell when your blood sugar is high? No   Have you had a dilated eye exam in the past 12 months? Yes   Have you had a dental exam in the past 12 months? Yes   Are you checking your feet? Yes   How many days per week are you checking your feet? 7   Dietary Intake   Breakfast 2 slices peanut butter white toast OR eats out twice per week (combo meal at [employer] or eggs, bacon, grits)  6 am   Snack (morning) almonds   Lunch leftovers or soup with crackers, fruit  12:00   Snack (afternoon) 2 pieces hard candy, NABS  2-3   Dinner chili with beans and cheese  6:15-8:30   Snack (evening) 1 glass milk, 1 reeces cup OR almonds and cashews, or snack size chips   Beverage(s) crystal lite, 1 regular soda per day,  sweet green tea occasionally, water, 1% or skim milk, occasional OJ   Exercise   Exercise Type Light (walking / raking leaves)   How many days per week to you exercise? 5   How many minutes per day do you exercise? 4   Total minutes per week of exercise 20   Patient Education   Previous Diabetes Education Yes (please comment)  when first diagnosed   Disease state  Definition of diabetes, type 1 and 2, and the diagnosis of diabetes   Nutrition management  Role of diet in the treatment of diabetes and the relationship between the three main macronutrients and blood glucose level;Food label reading, portion sizes and measuring food.;Meal options for control of blood glucose level and chronic complications.   Physical activity  and exercise  Role of exercise on diabetes management, blood pressure control and cardiac health.   Monitoring Identified appropriate SMBG and/or A1C goals.   Acute complications Taught treatment of hypoglycemia - the 15 rule.   Chronic complications Relationship between chronic complications and blood glucose control   Psychosocial adjustment Worked with patient to identify barriers to care and solutions   Personal strategies to promote health Lifestyle issues that need to be addressed for better diabetes care   Individualized Goals (developed by patient)   Nutrition General guidelines for healthy choices and portions discussed;Follow meal plan discussed   Physical Activity Exercise 3-5 times per week;30 minutes per day   Medications take my medication as prescribed   Monitoring  test my blood glucose as discussed   Reducing Risk examine blood glucose patterns;treat hypoglycemia with 15 grams of carbs if blood glucose less than /dL   Outcomes   Future DMSE PRN   Program Status Completed      Individualized Plan for Diabetes Self-Management Training:   Learning Objective:  Patient will have a greater understanding of diabetes self-management. Patient education plan is to attend individual and/or group sessions per assessed needs and concerns.   Plan:   Patient Instructions  Avoid drinking beverages with carbohydrate. Get into an exercise habit.  Aim for 30 minutes most days of the week. Include more non-starchy vegetables. Aim for 4 Carb Choices per meal (60 grams) +/- 1 either way  Aim for 0-2 Carbs per snack if hungry  Include protein in moderation with your meals and snacks Consider reading food labels for Total Carbohydrate and Fat Grams of foods Consider checking BG at alternate times per day as directed by MD  Consider taking medication as directed by MD    Expected Outcomes:   Demonstrated interest in learning.  Expect positive outcomes.  Education material  provided: Living Well with Diabetes, Food label handouts, A1C conversion sheet, Meal plan card, My Plate and Snack sheet, breakfast ideas  If problems or questions, patient to contact team via:  Phone and Email  Future DSME appointment: PRN

## 2015-01-09 ENCOUNTER — Ambulatory Visit: Payer: Self-pay | Admitting: Endocrinology

## 2015-01-10 ENCOUNTER — Ambulatory Visit (INDEPENDENT_AMBULATORY_CARE_PROVIDER_SITE_OTHER): Payer: Commercial Managed Care - HMO | Admitting: Endocrinology

## 2015-01-10 ENCOUNTER — Encounter: Payer: Self-pay | Admitting: Endocrinology

## 2015-01-10 ENCOUNTER — Other Ambulatory Visit (INDEPENDENT_AMBULATORY_CARE_PROVIDER_SITE_OTHER): Payer: Commercial Managed Care - HMO | Admitting: *Deleted

## 2015-01-10 VITALS — BP 132/90 | HR 90 | Temp 98.5°F | Resp 14 | Ht 72.0 in | Wt 289.8 lb

## 2015-01-10 DIAGNOSIS — E1165 Type 2 diabetes mellitus with hyperglycemia: Secondary | ICD-10-CM

## 2015-01-10 DIAGNOSIS — Z794 Long term (current) use of insulin: Secondary | ICD-10-CM | POA: Diagnosis not present

## 2015-01-10 DIAGNOSIS — Z23 Encounter for immunization: Secondary | ICD-10-CM

## 2015-01-10 MED ORDER — INSULIN DETEMIR 100 UNIT/ML FLEXPEN: 34.0000 [IU] | PEN_INJECTOR | Freq: Every day | SUBCUTANEOUS | Status: DC

## 2015-01-10 MED ORDER — ALBIGLUTIDE 50 MG ~~LOC~~ PEN: PEN_INJECTOR | SUBCUTANEOUS | Status: DC

## 2015-01-10 NOTE — Progress Notes (Signed)
Patient ID: Todd Gaines, male   DOB: 05-Oct-1975, 39 y.o.   MRN: [id number]           Reason for Appointment: Follow-up for Type 2 Diabetes  Referring physician: [facility]  History of Present Illness:          Date of diagnosis of type 2 diabetes mellitus: 2012       Background history:   He believes her A1c was 12% when he was first diagnosed to have diabetes and was having symptoms of feeling very tired. Initially he made changes in his lifestyle and was able to lose some weight also He was probably treated with metformin and may have been given Tradjenta subsequently Previous records are not available He thinks his A1c had been around 7-7.5% for some time until last year  Recent history:   INSULIN dosage: 30 units Lantus at bedtime   Because of persistently poor control and A1c of 9.9% on his regimen of Tanzeum, metformin he was started on basal insulin in 9/16 He was prescribed Tresiba but his insurance did not cover it and he is taking Lantus Although he was told to start with 16 units and gradually increase his dose he simply went by his prescription and started taking 30 units Did not bring his monitor for download today  Current blood sugar patterns and problems identified:  He is is having much better blood sugars compared to his last visit although not clear when and how often he is monitoring  He thinks his blood sugars are fairly consistently around 150 both morning and evening, not clear how many readings in the evenings are after meals  He also thinks that he is tolerating metformin better with taking 4 tablets after supper.  However he has done this only about 3 days.  He has seen the dietitian and was asked to at least cut back on his drinks which sugar which she is trying.  However he has gained weight  He is tolerating Tanzeum fairly well without any nausea   Oral hypoglycemic drugs the patient is taking are: Metformin ER 2000 mg a day      Side effects  from medications have been:none  Compliance with the medical regimen: Fair    Glucose monitoring:  done less than 1 times a day         Glucometer: One Touch.      Blood Glucose readings   Mean values apply above for all meters except median for One Touch  PRE-MEAL Fasting Lunch Dinner Bedtime Overall  Glucose range: 150   140-160   Mean/median:         Self-care:  Typical meal intake: Breakfast is biscuit or eggs  Snacks will be peanut butter crackers, chips, pretzels and peanut butter sweets              Dietician visit, most recent: 10/16               Exercise:  some walking   Weight history: 275-298 lbs  Wt Readings from Last 3 Encounters:  01/10/15 289 lb 12.8 oz (131.452 kg)  12/28/14 290 lb (131.543 kg)  12/14/14 282 lb 9.6 oz (128.187 kg)    Glycemic control:   Lab Results  Component Value Date   HGBA1C 9.9 12/14/2014   HGBA1C 9.0* 09/10/2014   HGBA1C 11.0* 04/18/2014   Lab Results  Component Value Date   LDLCALC 42 04/18/2014   CREATININE 0.99 04/18/2014  Medication List       This list is accurate as of: 01/10/15  5:16 PM.  Always use your most recent med list.               Albiglutide 50 MG Pen  Commonly known as:  TANZEUM  Inject contents of one pen once per week     atorvastatin 10 MG tablet  Commonly known as:  LIPITOR  Take 1 tablet (10 mg total) by mouth daily.     glucose blood test strip  Check fasting blood sugar and 2 hour post prandial in afternoon or evening. Dx E11.9.     Insulin Detemir 100 UNIT/ML Pen  Commonly known as:  LEVEMIR FLEXTOUCH  Inject 34 Units into the skin daily at 10 pm.     Insulin Glargine 100 UNIT/ML Solostar Pen  Commonly known as:  LANTUS SOLOSTAR  Inject 30 Units into the skin daily at 10 pm.     Insulin Pen Needle 32G X 4 MM Misc  Commonly known as:  BD PEN NEEDLE NANO U/F  Use to inject insulin once a day     lisinopril 10 MG tablet  Commonly known as:  PRINIVIL,ZESTRIL  Take 1  tablet (10 mg total) by mouth daily.     meloxicam 15 MG tablet  Commonly known as:  MOBIC  Take 1 tablet (15 mg total) by mouth daily as needed for pain (with food).     metFORMIN 500 MG 24 hr tablet  Commonly known as:  GLUCOPHAGE XR  Take 4 tablets daily     ONE TOUCH ULTRA 2 W/DEVICE Kit  Check fasting blood sugar and 2 hour post prandial BS in afternoon or evening. E11.9.     ONETOUCH DELICA LANCETS FINE Misc  Check fasting blood sugar and 2 hour post prandial in afternoon or evening. E11.9     OTEZLA 10 & 20 & 30 MG Tbpk  Generic drug:  Apremilast  Take by mouth.     STELARA [place]  Inject into the skin.     tiZANidine 4 MG tablet  Commonly known as:  ZANAFLEX  Take 1 tablet (4 mg total) by mouth Nightly.        Allergies: No Known Allergies  Past Medical History  Diagnosis Date  . Diabetes mellitus without complication ([place])   . Elevated cholesterol   . Psoriasis     No past surgical history on file.  Family History  Problem Relation Age of Onset  . Diabetes Mother   . Diabetes Paternal Grandfather   . Hypertension Paternal Grandfather   . Heart disease Neg Hx   . Cancer Neg Hx     Social History:  reports that he has quit smoking. He has never used smokeless tobacco. He reports that he does not drink alcohol or use illicit drugs.    Review of Systems    Lipid history: On treatment with Lipitor for the last 3-4 years   Lab Results  Component Value Date   CHOL 104 04/18/2014   HDL 27.20* 04/18/2014   LDLCALC 42 04/18/2014   TRIG 172.0* 04/18/2014   CHOLHDL 4 04/18/2014           Most recent eye exam was in 2/16  Hypertension: Blood pressure is relatively high today, followed by PCP and is taking 10 mg lisinopril   LABS:  No visits with results within 1 Week(s) from this visit. Latest known visit with results is:  Office Visit on 12/14/2014  Component Date Value Ref Range Status  . Hemoglobin A1C 12/14/2014 9.9   Final    Physical  Examination:  BP 132/90 mmHg  Pulse 90  Temp(Src) 98.5 F (36.9 C)  Resp 14  Ht 6' (1.829 m)  Wt 289 lb 12.8 oz (131.452 kg)  BMI 39.30 kg/m2  SpO2 96%       ASSESSMENT:  Diabetes type 2, uncontrolled with obesity See history of present illness for detailed discussion of his current management, blood sugar patterns and problems identified  He has had reportedly good blood sugars with starting basal insulin in addition to his Tanzeum and metformin ER However did not bring his monitor and not clear what his blood glucose patterns are Also not doing enough readings after meals Although he has gained weight this may be related to his improvement in blood sugars Diet is still suboptimal with not completely eliminating all drinks with sugars; however he is trying to followed instructions given by dietitian  Hypertension: Blood pressure is relatively high today, he will continue follow up with PCP  PLAN:    Have shown him how to titrate his Lantus insulin based on fasting blood sugar every 3 days and he will use the worksheet given  For now he will increase the doses by at least 2 units  Although he prefers to change the Lantus to Levemir because of insurance issues discussed that he may need to use a higher dose of this and possibly twice a day injections  He will try to do more readings after meals  Continue metformin and may take 4 tablets after dinner  Trial of 50 mg Tanzeum instead of 30  A1c on the next visit  He will bring blood sugar monitor for download on each visit  Patient Instructions  Lantus 32 units and keep am sugar <130  Check blood sugars on waking up 3-4  times a week Also check blood sugars about 2 hours after a meal and do this after different meals by rotation  Recommended blood sugar levels on waking up is 90-130 and about 2 hours after meal is 130-160  Please bring your blood sugar monitor to each visit, thank you  Metformin 4 after  dinner     Counseling time on subjects discussed above is over 50% of today's 25 minute visit  Influenza vaccine given  [facility] 01/10/2015, 5:16 PM   Note: This office note was prepared with Dragon voice recognition system technology. Any transcriptional errors that result from this process are unintentional.

## 2015-01-10 NOTE — Patient Instructions (Addendum)
Lantus 32 units and keep am sugar <130  Check blood sugars on waking up 3-4  times a week Also check blood sugars about 2 hours after a meal and do this after different meals by rotation  Recommended blood sugar levels on waking up is 90-130 and about 2 hours after meal is 130-160  Please bring your blood sugar monitor to each visit, thank you  Metformin 4 after dinner

## 2015-03-01 ENCOUNTER — Other Ambulatory Visit: Payer: Self-pay

## 2015-03-01 ENCOUNTER — Ambulatory Visit: Payer: Self-pay | Admitting: Endocrinology

## 2015-03-05 ENCOUNTER — Other Ambulatory Visit: Payer: Commercial Managed Care - HMO

## 2015-03-05 ENCOUNTER — Encounter: Payer: Self-pay | Admitting: Endocrinology

## 2015-03-05 ENCOUNTER — Ambulatory Visit (INDEPENDENT_AMBULATORY_CARE_PROVIDER_SITE_OTHER): Payer: Commercial Managed Care - HMO | Admitting: Endocrinology

## 2015-03-05 ENCOUNTER — Encounter: Payer: Commercial Managed Care - HMO | Attending: Endocrinology | Admitting: Nutrition

## 2015-03-05 VITALS — BP 122/82 | HR 88 | Temp 98.3°F | Resp 14 | Ht 72.0 in | Wt 291.0 lb

## 2015-03-05 DIAGNOSIS — I10 Essential (primary) hypertension: Secondary | ICD-10-CM | POA: Insufficient documentation

## 2015-03-05 DIAGNOSIS — E1165 Type 2 diabetes mellitus with hyperglycemia: Secondary | ICD-10-CM | POA: Diagnosis not present

## 2015-03-05 DIAGNOSIS — Z713 Dietary counseling and surveillance: Secondary | ICD-10-CM | POA: Insufficient documentation

## 2015-03-05 DIAGNOSIS — Z794 Long term (current) use of insulin: Secondary | ICD-10-CM | POA: Diagnosis not present

## 2015-03-05 DIAGNOSIS — E119 Type 2 diabetes mellitus without complications: Secondary | ICD-10-CM

## 2015-03-05 LAB — POCT GLYCOSYLATED HEMOGLOBIN (HGB A1C): Hemoglobin A1C: 8.4

## 2015-03-05 MED ORDER — DULAGLUTIDE 1.5 MG/0.5ML ~~LOC~~ SOAJ: SUBCUTANEOUS | Status: DC

## 2015-03-05 NOTE — Patient Instructions (Addendum)
Check blood sugars on waking up 4  times a week Also check blood sugars about 2 hours after a meal and do this after different meals by rotation  Recommended blood sugar levels on waking up is 90-130 and about 2 hours after meal is 130-160  Please bring your blood sugar monitor to each visit, thank you  V-Go 30, use 1 click for Bfst and lunch and 2 at dinner or larger meals

## 2015-03-05 NOTE — Patient Instructions (Signed)
Apply the filled V-go tonight at [facility]8PM Fill and apply a new V-Go every night Stop the Levemir insulin Test blood sugars before meals and at bedtime, and 2 hours after eating one meal each day Call 800 telephone number on the orange card if questions.   Call blood sugars in on Friday

## 2015-03-05 NOTE — Progress Notes (Signed)
Patient ID: Todd Gaines, male   DOB: Jun 09, 1975, 39 y.o.   MRN: [id number]           Reason for Appointment: Follow-up for Type 2 Diabetes  Referring physician: [facility]  History of Present Illness:          Date of diagnosis of type 2 diabetes mellitus: 2012       Background history:   He believes her A1c was 12% when he was first diagnosed to have diabetes and was having symptoms of feeling very tired. Initially he made changes in his lifestyle and was able to lose some weight also He was probably treated with metformin and may have been given Tradjenta subsequently Previous records are not available He thinks his A1c had been around 7-7.5% for some time until last year  Recent history:   INSULIN dosage: 42 units  Levemir at bedtime   Because of persistently poor control and A1c of 9.9% on his regimen of  30 mgTanzeum, metformin he was started on basal insulin in 9/16 He was prescribed  Lantus but since Levemir was cheaper he is using this now  Although he was on 30 units on his last visit he has worked of the Levemir dose to 37 and still his fasting blood sugars are relatively high  Current blood sugar patterns and problems identified:  He is still having relatively high readings in the mornings , mostly in the 130-160 range , recent range 102- 214    He thinks that his blood sugars after supper are about 170 but has checked only once  After supper  He has seen the dietitian and  Has some difficulty being consistent with this  However he has gained  2 pounds weight  He is tolerating Tanzeum  50 mg fairly well without any nausea   Oral hypoglycemic drugs the patient is taking are: Metformin ER 2000 mg a day      Side effects from medications have been:none  Compliance with the medical regimen: Fair    Glucose monitoring:  done less than 1 times a day         Glucometer: One Touch.      Blood Glucose readings  As above  Self-care:  Typical meal intake: Breakfast is  biscuit or eggs  Snacks will be peanut butter crackers, chips, pretzels and peanut butter sweets              Dietician visit, most recent: 10/16               Exercise:  some walking   Weight history: 275-298 lbs  Wt Readings from Last 3 Encounters:  03/05/15 291 lb (131.997 kg)  01/10/15 289 lb 12.8 oz (131.452 kg)  12/28/14 290 lb (131.543 kg)    Glycemic control:   Lab Results  Component Value Date   HGBA1C 8.4 03/05/2015   HGBA1C 9.9 12/14/2014   HGBA1C 9.0* 09/10/2014   Lab Results  Component Value Date   LDLCALC 42 04/18/2014   CREATININE 0.99 04/18/2014         Medication List       This list is accurate as of: 03/05/15  8:50 AM.  Always use your most recent med list.               Albiglutide 50 MG Pen  Commonly known as:  TANZEUM  Inject contents of one pen once per week     atorvastatin 10 MG tablet  Commonly known  as:  LIPITOR  Take 1 tablet (10 mg total) by mouth daily.     Dulaglutide 1.5 MG/0.5ML Sopn  Commonly known as:  TRULICITY  Inject once a week with each pen     glucose blood test strip  Check fasting blood sugar and 2 hour post prandial in afternoon or evening. Dx E11.9.     Insulin Detemir 100 UNIT/ML Pen  Commonly known as:  LEVEMIR FLEXTOUCH  Inject 34 Units into the skin daily at 10 pm.     Insulin Glargine 100 UNIT/ML Solostar Pen  Commonly known as:  LANTUS SOLOSTAR  Inject 30 Units into the skin daily at 10 pm.     Insulin Pen Needle 32G X 4 MM Misc  Commonly known as:  BD PEN NEEDLE NANO U/F  Use to inject insulin once a day     lisinopril 10 MG tablet  Commonly known as:  PRINIVIL,ZESTRIL  Take 1 tablet (10 mg total) by mouth daily.     meloxicam 15 MG tablet  Commonly known as:  MOBIC  Take 1 tablet (15 mg total) by mouth daily as needed for pain (with food).     metFORMIN 500 MG 24 hr tablet  Commonly known as:  GLUCOPHAGE XR  Take 4 tablets daily     ONE TOUCH ULTRA 2 W/DEVICE Kit  Check fasting blood  sugar and 2 hour post prandial BS in afternoon or evening. E11.9.     ONETOUCH DELICA LANCETS FINE Misc  Check fasting blood sugar and 2 hour post prandial in afternoon or evening. E11.9     OTEZLA 10 & 20 & 30 MG Tbpk  Generic drug:  Apremilast  Take by mouth.     STELARA [place]  Inject into the skin.     tiZANidine 4 MG tablet  Commonly known as:  ZANAFLEX  Take 1 tablet (4 mg total) by mouth Nightly.        Allergies: No Known Allergies  Past Medical History  Diagnosis Date  . Diabetes mellitus without complication ([place])   . Elevated cholesterol   . Psoriasis     No past surgical history on file.  Family History  Problem Relation Age of Onset  . Diabetes Mother   . Diabetes Paternal Grandfather   . Hypertension Paternal Grandfather   . Heart disease Neg Hx   . Cancer Neg Hx     Social History:  reports that he has quit smoking. He has never used smokeless tobacco. He reports that he does not drink alcohol or use illicit drugs.    Review of Systems    Lipid history: On treatment with Lipitor for the last 3-4 years   Lab Results  Component Value Date   CHOL 104 04/18/2014   HDL 27.20* 04/18/2014   LDLCALC 42 04/18/2014   TRIG 172.0* 04/18/2014   CHOLHDL 4 04/18/2014           Most recent eye exam was in 2/16  Hypertension: Blood pressure is controlled with lisinopril   LABS:  Office Visit on 03/05/2015  Component Date Value Ref Range Status  . Hemoglobin A1C 03/05/2015 8.4   Final    Physical Examination:  BP 122/82 mmHg  Pulse 88  Temp(Src) 98.3 F (36.8 C)  Resp 14  Ht 6' (1.829 m)  Wt 291 lb (131.997 kg)  BMI 39.46 kg/m2  SpO2 97%       ASSESSMENT:  Diabetes type 2, uncontrolled with obesity See history of present illness  for detailed discussion of his current management, blood sugar patterns and problems identified  He has had  Less than optimal control with taking Levemir insulin    He has increased the dose but fasting blood  sugars are still high.  His A1c is relatively higher  At 8.4 although blood sugars are better only in the last 4-6 weeks  This is in addition to his Tanzeum and metformin ER He needs a more effective GLP-1 drugs than Tanzeum especially with the need for weight loss.  Discussed benefits of her drug like Trulicity and how this is injected and possible side effects  Not clear if he has high postprandial readings also which are currently not being covered   PLAN:    Have shown him how to  Use the V-go pump which should be more effective than Levemir and also allow the ability to dose at meals to control blood sugars postprandially   Discussed timing and targets of blood sugars at various times   He will start with 30 units on the basal and use 2-4 units for mealtime coverage  Trial of  Trulicity 1.5 instead of Tanzeum  He will bring blood sugar monitor for download on each visit   He will see the nurse educator and also review diet  Patient Instructions  Check blood sugars on waking up 4  times a week Also check blood sugars about 2 hours after a meal and do this after different meals by rotation  Recommended blood sugar levels on waking up is 90-130 and about 2 hours after meal is 130-160  Please bring your blood sugar monitor to each visit, thank you  V-Go 30, use 1 click for Bfst and lunch and 2 at dinner or larger meals    Counseling time on subjects discussed above is over 50% of today's 25 minute visit   Nakeshia Waldeck 03/05/2015, 8:50 AM   Note: This office note was prepared with Estate agent. Any transcriptional errors that result from this process are unintentional.

## 2015-03-05 NOTE — Progress Notes (Signed)
Eula was instructed on how to fill, apply and use the V-Go.  He was given a vial of Humalog and he re demonstrated how to fill the V-Go 30 correctly.  He will not apply the V-go until 8PM tonight, because he took his levemir last night at 9PM. He was reminded that he will not take any more Levemir, and he reported good understanding of this. He was instructed on how to give the meal time insulin, and redemonstrated this correctly on a trial V-go.  Writeen instructions were given for 1 button press before breakfast and lunch and 2 before supper, or with larger meals.  He reported good understanding of this and had no final questions.   He was told to call if questions today.   He was given a V-go 30 starter kit with Humalog insulin.  He filled out an insurance investigation form and it was faxed to V-go He was given a sheet to record his blood sugars, and told to test ac and HS, and do 1  2hour pc reading.  He will call those readings to Dr. Ronnie Derby office on Friday.

## 2015-03-12 ENCOUNTER — Telehealth: Payer: Self-pay | Admitting: *Deleted

## 2015-03-12 NOTE — Telephone Encounter (Signed)
Noted, message left on patients vm 

## 2015-03-12 NOTE — Telephone Encounter (Signed)
Mr. Lenard Forthermar called with his sugar readings.  12/23- breakfast- 195 Lunch- 242  Dinner-210  Bedtime- 184 12/24-             Dinner- 173 Bedtime-196 12/25- breakfast-146, lunch-176     Bedtime- 209 12/26- Breakfast 152  Lunch- 191  Dinner- 262 12/27- breakfast- 131  Lunch- 170  He took one unit @ breakfast and 2 units at lunch and supper.

## 2015-03-12 NOTE — Telephone Encounter (Signed)
Increase the clicks to 2  at breakfast and 3 at lunch and dinner, if possible like to see him back next week for follow-up instead of 17th

## 2015-03-14 ENCOUNTER — Other Ambulatory Visit: Payer: Self-pay | Admitting: *Deleted

## 2015-03-14 ENCOUNTER — Telehealth: Payer: Self-pay | Admitting: Endocrinology

## 2015-03-14 MED ORDER — INSULIN LISPRO 100 UNIT/ML ~~LOC~~ SOLN: SUBCUTANEOUS | Status: DC

## 2015-03-14 MED ORDER — V-GO 30 KIT: PACK | Status: DC

## 2015-03-14 NOTE — Telephone Encounter (Signed)
Patient need Humalog for the Vgo pump and the Vgo kit send to °[facility] - [place], [place] - [address] [phone] (Phone) °[phone] (Fax)  °  °  ° ° °

## 2015-03-14 NOTE — Telephone Encounter (Signed)
rx sent

## 2015-03-21 ENCOUNTER — Ambulatory Visit: Payer: Self-pay | Admitting: Endocrinology

## 2015-03-21 ENCOUNTER — Other Ambulatory Visit: Payer: Self-pay | Admitting: Endocrinology

## 2015-03-21 ENCOUNTER — Other Ambulatory Visit: Payer: Self-pay | Admitting: *Deleted

## 2015-03-21 ENCOUNTER — Ambulatory Visit (INDEPENDENT_AMBULATORY_CARE_PROVIDER_SITE_OTHER): Payer: Commercial Managed Care - HMO | Admitting: Endocrinology

## 2015-03-21 ENCOUNTER — Encounter: Payer: Self-pay | Admitting: Endocrinology

## 2015-03-21 VITALS — BP 122/84 | HR 79 | Temp 98.1°F | Resp 14 | Ht 72.0 in | Wt 292.4 lb

## 2015-03-21 DIAGNOSIS — Z794 Long term (current) use of insulin: Secondary | ICD-10-CM

## 2015-03-21 DIAGNOSIS — E1165 Type 2 diabetes mellitus with hyperglycemia: Secondary | ICD-10-CM | POA: Diagnosis not present

## 2015-03-21 MED ORDER — LIRAGLUTIDE 18 MG/3ML ~~LOC~~ SOPN: PEN_INJECTOR | SUBCUTANEOUS | Status: DC

## 2015-03-21 MED ORDER — INSULIN PEN NEEDLE 32G X 5 MM MISC: Status: DC

## 2015-03-21 NOTE — Patient Instructions (Signed)
Levemir 20 twice daily and keep am sugar in  Range  Check blood sugars on waking up 5  times a week Also check blood sugars about 2 hours after a meal and do this after different meals by rotation  Recommended blood sugar levels on waking up is 90-130 and about 2 hours after meal is 130-160  Please bring your blood sugar monitor to each visit, thank you   Start VICTOZA injection as shown once daily at the same time of the day.  Dial the dose to 0.6 mg on the pen for the first week.  You may inject in the stomach, thigh or arm. You may experience nausea in the first few days which usually goes away.  You will feel fullness of the stomach with starting the medication and should try to keep the portions at meals small.  After 3-4  increase the dose to 1.2mg  daily if no nausea present.   If any questions or concerns are present call the office or the [facility] helpline at [phone]. Visit [email or web address] for more useful information

## 2015-03-21 NOTE — Progress Notes (Signed)
Patient ID: Todd Gaines, male   DOB: 1975/06/03, 40 y.o.   MRN: [id number]           Reason for Appointment: Follow-up for Type 2 Diabetes  Referring physician: [facility]  History of Present Illness:          Date of diagnosis of type 2 diabetes mellitus: 2012       Background history:   He believes her A1c was 12% when he was first diagnosed to have diabetes and was having symptoms of feeling very tired. Initially he made changes in his lifestyle and was able to lose some weight also He was probably treated with metformin and may have been given Tradjenta subsequently Previous records are not available He thinks his A1c had been around 7-7.5% for some time until last year  Recent history:   INSULIN dosage:  Levemir 38 units   Because of persistently poor control and A1c of 9.9% on his regimen of  30 mgTanzeum, metformin  He was started on basal insulin in 9/16 when his A1c was 9.9 He was switched to the V-go pump because of inadequate and inconsistent control and need for postprandial control also He was doing fairly well with this and using 2-6 units mealtime coverage However in the last couple of days he has not used the pump because of lack of adequate insurance coverage and out-of-pocket expense He has also been started on TRULICITY 1.5 mg about 2 weeks ago instead of Tanzeum  Current blood sugar patterns and problems identified:   He is having mildly increased blood sugars throughout the day and still has somewhat high readings fasting even with the pump with the lowest blood sugar 131  Blood sugars are mildly increased later in the day and with the reading of 212 after supper he was told to take 6 units bolus for supper  Last Friday because of feeling dizzy, nauseated and having a headache he thought he had a low blood sugar he stopped doing the boluses for meals; blood sugar has been as high as 248 after meals now  Glucose in the last 3-4 days has been ranging from  99-183 but he has had some candy and sweets drinks also  He has cut back on his portions and currently is having some nausea for a day or 2 after his Trulicity injection  Oral hypoglycemic drugs the patient is taking are: Metformin ER 2000 mg a day      Side effects from medications have been:none  Compliance with the medical regimen: Fair    Glucose monitoring:  2-3 times a day         Glucometer: One Touch.      Blood Glucose readings  As above  Did not bring monitor for download today  Self-care:  Typical meal intake: Breakfast is peanut butter sandwich now Snacks will be peanut butter crackers, chips, pretzels and peanut butter sweets              Dietician visit, most recent: 10/16               Exercise:  some walking   Weight history: 275-298 lbs  Wt Readings from Last 3 Encounters:  03/21/15 292 lb 6.4 oz (132.632 kg)  03/05/15 291 lb (131.997 kg)  01/10/15 289 lb 12.8 oz (131.452 kg)    Glycemic control:   Lab Results  Component Value Date   HGBA1C 8.4 03/05/2015   HGBA1C 9.9 12/14/2014   HGBA1C 9.0* 09/10/2014  Lab Results  Component Value Date   LDLCALC 42 04/18/2014   CREATININE 0.99 04/18/2014         Medication List       This list is accurate as of: 03/21/15 12:30 PM.  Always use your most recent med list.               Albiglutide 50 MG Pen  Commonly known as:  TANZEUM  Inject contents of one pen once per week     atorvastatin 10 MG tablet  Commonly known as:  LIPITOR  Take 1 tablet (10 mg total) by mouth daily.     Dulaglutide 1.5 MG/0.5ML Sopn  Commonly known as:  TRULICITY  Inject once a week with each pen     glucose blood test strip  Check fasting blood sugar and 2 hour post prandial in afternoon or evening. Dx E11.9.     Insulin Detemir 100 UNIT/ML Pen  Commonly known as:  LEVEMIR FLEXTOUCH  Inject 34 Units into the skin daily at 10 pm.     Insulin Glargine 100 UNIT/ML Solostar Pen  Commonly known as:  LANTUS SOLOSTAR    Inject 30 Units into the skin daily at 10 pm.     insulin lispro 100 UNIT/ML injection  Commonly known as:  HUMALOG  Use max 70 units per day with V-go pump     Insulin Pen Needle 32G X 4 MM Misc  Commonly known as:  BD PEN NEEDLE NANO U/F  Use to inject insulin once a day     Liraglutide 18 MG/3ML Sopn  Commonly known as:  VICTOZA  Inject 1.2 mg daily     lisinopril 10 MG tablet  Commonly known as:  PRINIVIL,ZESTRIL  Take 1 tablet (10 mg total) by mouth daily.     meloxicam 15 MG tablet  Commonly known as:  MOBIC  Take 1 tablet (15 mg total) by mouth daily as needed for pain (with food).     metFORMIN 500 MG 24 hr tablet  Commonly known as:  GLUCOPHAGE XR  Take 4 tablets daily     ONE TOUCH ULTRA 2 w/Device Kit  Check fasting blood sugar and 2 hour post prandial BS in afternoon or evening. E11.9.     ONETOUCH DELICA LANCETS FINE Misc  Check fasting blood sugar and 2 hour post prandial in afternoon or evening. E11.9     OTEZLA 10 & 20 & 30 MG Tbpk  Generic drug:  Apremilast  Take by mouth.     STELARA [place]  Inject into the skin.     tiZANidine 4 MG tablet  Commonly known as:  ZANAFLEX  Take 1 tablet (4 mg total) by mouth Nightly.     V-GO 30 Kit  Use one per day.        Allergies: No Known Allergies  Past Medical History  Diagnosis Date  . Diabetes mellitus without complication ([place])   . Elevated cholesterol   . Psoriasis     No past surgical history on file.  Family History  Problem Relation Age of Onset  . Diabetes Mother   . Diabetes Paternal Grandfather   . Hypertension Paternal Grandfather   . Heart disease Neg Hx   . Cancer Neg Hx     Social History:  reports that he has quit smoking. He has never used smokeless tobacco. He reports that he does not drink alcohol or use illicit drugs.    Review of Systems    Lipid  history: On treatment with Lipitor for the last 3-4 years   Lab Results  Component Value Date   CHOL 104 04/18/2014   HDL  27.20* 04/18/2014   LDLCALC 42 04/18/2014   TRIG 172.0* 04/18/2014   CHOLHDL 4 04/18/2014           Most recent eye exam was in 2/16  Hypertension: Blood pressure is controlled with lisinopril   LABS:  No visits with results within 1 Week(s) from this visit. Latest known visit with results is:  Office Visit on 03/05/2015  Component Date Value Ref Range Status  . Hemoglobin A1C 03/05/2015 8.4   Final    Physical Examination:  BP 122/84 mmHg  Pulse 79  Temp(Src) 98.1 F (36.7 C)  Resp 14  Ht 6' (1.829 m)  Wt 292 lb 6.4 oz (132.632 kg)  BMI 39.65 kg/m2  SpO2 97%       ASSESSMENT:  Diabetes type 2, uncontrolled with obesity See history of present illness for detailed discussion of his current management, blood sugar patterns and problems identified  He has had inconsistent control although appeared to be doing somewhat better with the V-go pump He will not use this now because of cost and wants the Levemir which is least expensive on his plan Not clear if he is having higher postprandial readings if he is watching his diet recently May be benefiting from taking TRULICITY which is causing better satiety effects than Tanzeum Last A1c was still high at 8.4  PLAN:    Have advised him to continue Trulicity 1.5 mg weekly  Since his plan will cover Victoza better he can switch to this starting with 0.6 and then going up to 1.2 mg if tolerated after 4 days.  Demonstrated how to use the pen and given him patient information brochure, discussed side effects and safety and injection timing  For now he will try Levemir twice a day, 20 units and go up to units if fasting readings are above 130 consistently  He will call if he has postprandial hyperglycemia and start Humalog 4-6 units if needed  Better diet and avoid drinks which sugar  Patient Instructions  Levemir 20 twice daily and keep am sugar in  Range  Check blood sugars on waking up 5  times a week Also check blood  sugars about 2 hours after a meal and do this after different meals by rotation  Recommended blood sugar levels on waking up is 90-130 and about 2 hours after meal is 130-160  Please bring your blood sugar monitor to each visit, thank you   Start VICTOZA injection as shown once daily at the same time of the day.  Dial the dose to 0.6 mg on the pen for the first week.  You may inject in the stomach, thigh or arm. You may experience nausea in the first few days which usually goes away.  You will feel fullness of the stomach with starting the medication and should try to keep the portions at meals small.  After 3-4  increase the dose to 1.'2mg'$  daily if no nausea present.   If any questions or concerns are present call the office or the [place] helpline at [phone]. Visit [email or web address].wall.info/ for more useful information      Counseling time on subjects discussed above is over 50% of today's 25 minute visit   Dustee Bottenfield 03/21/2015, 12:30 PM   Note: This office note was prepared with Estate agent. Any transcriptional errors  that result from this process are unintentional.

## 2015-03-27 ENCOUNTER — Ambulatory Visit: Payer: Self-pay | Admitting: Endocrinology

## 2015-04-10 ENCOUNTER — Other Ambulatory Visit: Payer: Self-pay | Admitting: Endocrinology

## 2015-04-29 ENCOUNTER — Other Ambulatory Visit: Payer: Self-pay

## 2015-05-20 LAB — HM DIABETES EYE EXAM

## 2015-05-27 ENCOUNTER — Encounter: Payer: Self-pay | Admitting: *Deleted

## 2015-05-30 ENCOUNTER — Ambulatory Visit (INDEPENDENT_AMBULATORY_CARE_PROVIDER_SITE_OTHER): Payer: Commercial Managed Care - HMO | Admitting: Endocrinology

## 2015-05-30 ENCOUNTER — Encounter: Payer: Self-pay | Admitting: Endocrinology

## 2015-05-30 VITALS — BP 116/74 | HR 95 | Temp 98.3°F | Resp 16 | Ht 72.0 in | Wt 289.8 lb

## 2015-05-30 DIAGNOSIS — E1165 Type 2 diabetes mellitus with hyperglycemia: Secondary | ICD-10-CM | POA: Diagnosis not present

## 2015-05-30 DIAGNOSIS — Z794 Long term (current) use of insulin: Secondary | ICD-10-CM | POA: Diagnosis not present

## 2015-05-30 DIAGNOSIS — I10 Essential (primary) hypertension: Secondary | ICD-10-CM | POA: Diagnosis not present

## 2015-05-30 NOTE — Patient Instructions (Addendum)
Levemir 18 in amd 24 in pm; check on [place]  [facility]  Add 5 clicks to 1.2 and then 1.8  Check blood sugars on waking up  3-4  times a week Also check blood sugars about 2 hours after a meal and do this after different meals by rotation  Recommended blood sugar levels on waking up is 90-130 and about 2 hours after meal is 130-160  Please bring your blood sugar monitor to each visit, thank you

## 2015-05-30 NOTE — Progress Notes (Signed)
Patient ID: Todd Gaines, male   DOB: 11-15-1975, 40 y.o.   MRN: [id number]           Reason for Appointment: Follow-up for Type 2 Diabetes  Referring physician: [facility]  History of Present Illness:          Date of diagnosis of type 2 diabetes mellitus: 2012       Background history:   He believes her A1c was 12% when he was first diagnosed to have diabetes and was having symptoms of feeling very tired. Initially he made changes in his lifestyle and was able to lose some weight also He was probably treated with metformin and may have been given Tradjenta subsequently Previous records are not available He thinks his A1c had been around 7-7.5% for some time until last year  Recent history:   INSULIN dosage:  Levemir 20 units twice a day  He was started on basal insulin in 9/16 when his A1c was 9.9 He has not used the V-go pump because of lack of adequate insurance coverage and out-of-pocket expense, this was improving his control He has also been tried on TRULICITY 1.5 mg; again because of insurance preference and some nausea this was changed to Victoza in 1/16 which he has tolerated fairly well with the 1.2 mg dose  Current blood sugar patterns and problems identified:   He is having  increased fasting blood sugars  He is checking blood sugars very sporadically later in the day and has only 2 readings nonfasting including a good readings this afternoon and is significantly high reading at night 3 weeks ago  He says that he did have some higher readings about 2-3 weeks ago because of getting steroids for poison ivy  He has lost 3 pounds   His diet has been generally better but not consistent  He has not been able to walk much for exercise  Non-insulin hypoglycemic drugs the patient is taking are: Metformin ER 2000 mg a day, Victoza 1.2 mg daily      Side effects from medications have been: Transient nausea from Trulicity  Compliance with the medical regimen: Fair     Glucose monitoring:  0-1 times a day         Glucometer: One Touch.      Blood Glucose readings   Mean values apply above for all meters except median for One Touch  PRE-MEAL Fasting Lunch Dinner Bedtime Overall  Glucose range: 148-195    296    Mean/median: 166     166      Self-care:  Typical meal intake: Breakfast is peanut butter sandwich  Snacks will be peanut butter crackers, chips, pretzels and peanut butter               Dietician visit, most recent: 10/16               Exercise:  some walking   Weight history: 275-298 lbs  Wt Readings from Last 3 Encounters:  05/30/15 289 lb 12.8 oz (131.452 kg)  03/21/15 292 lb 6.4 oz (132.632 kg)  03/05/15 291 lb (131.997 kg)    Glycemic control:   Lab Results  Component Value Date   HGBA1C 8.4 03/05/2015   HGBA1C 9.9 12/14/2014   HGBA1C 9.0* 09/10/2014   Lab Results  Component Value Date   LDLCALC 42 04/18/2014   CREATININE 0.99 04/18/2014         Medication List       This list is  accurate as of: 05/30/15 11:59 PM.  Always use your most recent med list.               Albiglutide 50 MG Pen  Commonly known as:  TANZEUM  Inject contents of one pen once per week     atorvastatin 10 MG tablet  Commonly known as:  LIPITOR  Take 1 tablet (10 mg total) by mouth daily.     Dulaglutide 1.5 MG/0.5ML Sopn  Commonly known as:  TRULICITY  Inject once a week with each pen     glucose blood test strip  Check fasting blood sugar and 2 hour post prandial in afternoon or evening. Dx E11.9.     Insulin Detemir 100 UNIT/ML Pen  Commonly known as:  LEVEMIR FLEXTOUCH  Inject 34 Units into the skin daily at 10 pm.     Insulin Pen Needle 32G X 5 MM Misc  Commonly known as:  NOVOTWIST  Use one per day to inject victoza     Liraglutide 18 MG/3ML Sopn  Commonly known as:  VICTOZA  Inject 1.2 mg daily     lisinopril 10 MG tablet  Commonly known as:  PRINIVIL,ZESTRIL  Take 1 tablet (10 mg total) by mouth daily.      meloxicam 15 MG tablet  Commonly known as:  MOBIC  Take 1 tablet (15 mg total) by mouth daily as needed for pain (with food).     metFORMIN 500 MG 24 hr tablet  Commonly known as:  GLUCOPHAGE-XR  TAKE 4 TABLETS DAILY     ONE TOUCH ULTRA 2 w/Device Kit  Check fasting blood sugar and 2 hour post prandial BS in afternoon or evening. E11.9.     ONETOUCH DELICA LANCETS FINE Misc  Check fasting blood sugar and 2 hour post prandial in afternoon or evening. E11.9     OTEZLA 10 & 20 & 30 MG Tbpk  Generic drug:  Apremilast  Take by mouth. Reported on 05/30/2015     STELARA [place]  Inject into the skin.     tiZANidine 4 MG tablet  Commonly known as:  ZANAFLEX  Take 1 tablet (4 mg total) by mouth Nightly.        Allergies: No Known Allergies  Past Medical History  Diagnosis Date  . Diabetes mellitus without complication ([place])   . Elevated cholesterol   . Psoriasis     No past surgical history on file.  Family History  Problem Relation Age of Onset  . Diabetes Mother   . Diabetes Paternal Grandfather   . Hypertension Paternal Grandfather   . Heart disease Neg Hx   . Cancer Neg Hx     Social History:  reports that he has quit smoking. He has never used smokeless tobacco. He reports that he does not drink alcohol or use illicit drugs.    Review of Systems    Lipid history: On treatment with Lipitor for the last 3-4 years   Lab Results  Component Value Date   CHOL 104 04/18/2014   HDL 27.20* 04/18/2014   LDLCALC 42 04/18/2014   TRIG 172.0* 04/18/2014   CHOLHDL 4 04/18/2014           Most recent eye exam was in 3/17  Hypertension: Blood pressure is controlled with lisinopril   LABS:  Abstract on 05/27/2015  Component Date Value Ref Range Status  . HM Diabetic Eye Exam 05/20/2015 No Retinopathy  No Retinopathy Final    Physical Examination:  BP 116/74  mmHg  Pulse 95  Temp(Src) 98.3 F (36.8 C)  Resp 16  Ht 6' (1.829 m)  Wt 289 lb 12.8 oz (131.452 kg)  BMI  39.30 kg/m2  SpO2 96%       ASSESSMENT:  Diabetes type 2, uncontrolled with obesity See history of present illness for detailed discussion of his current management, blood sugar patterns and problems identified His blood sugars are relatively higher fasting with current regimen of Levemir twice a day Currently no recent labs available to assess his blood sugar control, last A1c was 8.4  He is now taking Victoza instead of Trulicity because of insurance preference and is tolerating this without any nausea compared to Trulicity also. Some of his high sugars recently has been from getting repeated prednisone for poison ivy He does need to check readings after meals as he is primarily checking in the morning and recently and frequently He can do somewhat better with diet and also needs to start exercising which he has not been doing much   PLAN:    Have shown him how to use the Victoza pen to increase the dose up to 1.5 mg and if tolerated 1.8 subsequently  [place] or walking for exercise  Consistent diet  Check coverage for Tresiba  Meanwhile increase Levemir to 20 4 in the evening and reduce morning dose to 18  More blood sugars after meals and review these on follow-up to consider mealtime insulin  Needs follow-up lipids  Patient Instructions  Levemir 18 in amd 24 in pm; check on Tresiba  Victoza  Add 5 clicks to 1.2 and then 1.8  Check blood sugars on waking up  3-4  times a week Also check blood sugars about 2 hours after a meal and do this after different meals by rotation  Recommended blood sugar levels on waking up is 90-130 and about 2 hours after meal is 130-160  Please bring your blood sugar monitor to each visit, thank you         Counseling time on subjects discussed above is over 50% of today's 25 minute visit   Lisseth Brazeau 05/31/2015, 12:52 PM   Note: This office note was prepared with Estate agent. Any transcriptional  errors that result from this process are unintentional.

## 2015-07-15 ENCOUNTER — Other Ambulatory Visit: Payer: Self-pay | Admitting: *Deleted

## 2015-07-15 ENCOUNTER — Telehealth: Payer: Self-pay | Admitting: *Deleted

## 2015-07-15 ENCOUNTER — Telehealth: Payer: Self-pay | Admitting: Endocrinology

## 2015-07-15 MED ORDER — BASAGLAR KWIKPEN 100 UNIT/ML ~~LOC~~ SOPN
38.0000 [IU] | PEN_INJECTOR | Freq: Every day | SUBCUTANEOUS | Status: DC
Start: 2015-07-15 — End: 2016-01-03

## 2015-07-15 NOTE — Telephone Encounter (Signed)
Rx sent for basaglar, he said [facility] is [facility]

## 2015-07-15 NOTE — Telephone Encounter (Signed)
Need to know if Todd Gaines is covered, this is preferable to [employer].  If it is not then he can use the same dose of Basaglar as Levemir for now

## 2015-07-15 NOTE — Telephone Encounter (Signed)
Pt needs basaglar called into [facility] [place] on [address]

## 2015-07-15 NOTE — Telephone Encounter (Signed)
PT calling to check and see if the Rx from earlier phone call had been called in.

## 2015-07-15 NOTE — Telephone Encounter (Signed)
Please see below, It's not in his medication list?

## 2015-07-26 ENCOUNTER — Other Ambulatory Visit: Payer: Self-pay

## 2015-07-30 ENCOUNTER — Other Ambulatory Visit: Payer: Self-pay

## 2015-08-02 ENCOUNTER — Ambulatory Visit: Payer: Self-pay | Admitting: Endocrinology

## 2015-08-10 ENCOUNTER — Other Ambulatory Visit: Payer: Self-pay | Admitting: Endocrinology

## 2015-10-01 ENCOUNTER — Other Ambulatory Visit: Payer: Self-pay | Admitting: Family Medicine

## 2015-10-28 ENCOUNTER — Other Ambulatory Visit: Payer: Self-pay | Admitting: Family Medicine

## 2015-10-29 ENCOUNTER — Other Ambulatory Visit: Payer: Self-pay | Admitting: Endocrinology

## 2015-11-05 ENCOUNTER — Other Ambulatory Visit (INDEPENDENT_AMBULATORY_CARE_PROVIDER_SITE_OTHER): Payer: Commercial Managed Care - HMO

## 2015-11-05 DIAGNOSIS — E119 Type 2 diabetes mellitus without complications: Secondary | ICD-10-CM

## 2015-11-05 DIAGNOSIS — Z794 Long term (current) use of insulin: Secondary | ICD-10-CM

## 2015-11-05 DIAGNOSIS — E1165 Type 2 diabetes mellitus with hyperglycemia: Secondary | ICD-10-CM

## 2015-11-05 DIAGNOSIS — R7989 Other specified abnormal findings of blood chemistry: Secondary | ICD-10-CM | POA: Diagnosis not present

## 2015-11-05 LAB — BASIC METABOLIC PANEL
BUN: 12 mg/dL (ref 6–23)
CO2: 26 mEq/L (ref 19–32)
Calcium: 9.1 mg/dL (ref 8.4–10.5)
Chloride: 98 mEq/L (ref 96–112)
Creatinine, Ser: 0.92 mg/dL (ref 0.40–1.50)
GFR: 96.68 mL/min (ref >60.00)
Glucose, Bld: 331 mg/dL — ABNORMAL HIGH (ref 70–99)
Potassium: 4.8 mEq/L (ref 3.5–5.1)
Sodium: 134 mEq/L — ABNORMAL LOW (ref 135–145)

## 2015-11-05 LAB — LIPID PANEL
Cholesterol: 128 mg/dL (ref 0–200)
HDL: 25.4 mg/dL — ABNORMAL LOW (ref >39.00)
NonHDL: 102.81
Total CHOL/HDL Ratio: 5
Triglycerides: 266 mg/dL — ABNORMAL HIGH (ref 0.0–149.0)
VLDL: 53.2 mg/dL — ABNORMAL HIGH (ref 0.0–40.0)

## 2015-11-05 LAB — LDL CHOLESTEROL, DIRECT: Direct LDL: 77 mg/dL

## 2015-11-05 LAB — MICROALBUMIN / CREATININE URINE RATIO
Creatinine,U: 62.1 mg/dL
Microalb Creat Ratio: 1.9 mg/g (ref 0.0–30.0)
Microalb, Ur: 1.2 mg/dL (ref 0.0–1.9)

## 2015-11-05 LAB — HEMOGLOBIN A1C: Hgb A1c MFr Bld: 10.2 % — ABNORMAL HIGH (ref 4.6–6.5)

## 2015-11-08 ENCOUNTER — Encounter: Payer: Self-pay | Admitting: Endocrinology

## 2015-11-08 ENCOUNTER — Ambulatory Visit (INDEPENDENT_AMBULATORY_CARE_PROVIDER_SITE_OTHER): Payer: Commercial Managed Care - HMO | Admitting: Endocrinology

## 2015-11-08 VITALS — BP 118/74 | HR 84 | Ht 72.0 in | Wt 281.0 lb

## 2015-11-08 DIAGNOSIS — Z794 Long term (current) use of insulin: Secondary | ICD-10-CM | POA: Diagnosis not present

## 2015-11-08 DIAGNOSIS — I10 Essential (primary) hypertension: Secondary | ICD-10-CM

## 2015-11-08 DIAGNOSIS — E1165 Type 2 diabetes mellitus with hyperglycemia: Secondary | ICD-10-CM | POA: Diagnosis not present

## 2015-11-08 DIAGNOSIS — E785 Hyperlipidemia, unspecified: Secondary | ICD-10-CM | POA: Diagnosis not present

## 2015-11-08 MED ORDER — METFORMIN HCL ER 500 MG PO TB24: 2000.0000 mg | ORAL_TABLET | Freq: Every day | ORAL | 2 refills | Status: DC

## 2015-11-08 MED ORDER — LIRAGLUTIDE 18 MG/3ML ~~LOC~~ SOPN: PEN_INJECTOR | SUBCUTANEOUS | 5 refills | Status: DC

## 2015-11-08 NOTE — Patient Instructions (Addendum)
[facility]   Check cost of Tresiba  Check blood sugars on waking up  4x per week  Also check blood sugars about 2 hours after a meal and do this after different meals by rotation  Recommended blood sugar levels on waking up is 90-130 and about 2 hours after meal is 130-160  Please bring your blood sugar monitor to each visit, thank you

## 2015-11-08 NOTE — Progress Notes (Signed)
Patient ID: Todd Gaines, male   DOB: September 30, 1975, 40 y.o.   MRN: [id number]           Reason for Appointment: Follow-up for Type 2 Diabetes  Referring physician: [facility]  History of Present Illness:          Date of diagnosis of type 2 diabetes mellitus: 2012       Background history:   He believes her A1c was 12% when he was first diagnosed to have diabetes and was having symptoms of feeling very tired. Initially he made changes in his lifestyle and was able to lose some weight also He was probably treated with metformin and may have been given Tradjenta subsequently Previous records are not available He thinks his A1c had been around 7-7.5% for some time until last year  Recent history:   INSULIN dosage:  30 Basaglar hs, 14 Basaglar in am  Non-insulin hypoglycemic drugs the patient is taking are: Metformin ER 2000 mg a day, Victoza 1.2 mg daily  He was started on basal insulin in 9/16 when his A1c was 9.9 He has not used the V-go pump because of lack of adequate insurance coverage and out-of-pocket expense, this was improving his control  He has not been seen in follow-up for several months and blood sugars out of control with A1c 10.2  He has also been switched from Levemir to [employer] because of insurance preference  Current blood sugar patterns and problems identified:   He is taking somewhat arbitrary doses of insulin  Even though he was supposed to take Basaglar once a day he is taking it twice a day  He says his compliance with insulin has been poor and he has also not watched his diet at all  He has at some point not taking Victoza regularly since he felt his sugar was getting low during the day when he is more active  Did not bring his monitor for download  Checking blood sugars only before meals and not after  Lab glucose was over 300 after lunch  He reports higher blood sugars in the mornings and in the afternoon        Side effects from medications  have been: Transient nausea from Trulicity  Compliance with the medical regimen: Fair    Glucose monitoring:  0-1 times a day         Glucometer: One Touch.      Blood Glucose readings by recall  Mean values apply above for all meters except median for One Touch  PRE-MEAL Fasting Lunch Dinner Bedtime Overall  Glucose range: 200  170-200    Mean/median:        POST-MEAL PC Breakfast PC Lunch PC Dinner  Glucose range:     Mean/median:       Mean values apply above for all meters except median for One Touch  PRE-MEAL Fasting Lunch Dinner Bedtime Overall  Glucose range: 148-195    296    Mean/median: 166     166      Self-care:  Typical meal intake: Breakfast is peanut butter sandwich  Snacks will be peanut butter crackers, chips, pretzels and peanut butter               Dietician visit, most recent: 10/16               Exercise:  some walking   Weight history: 275-298 lbs  Wt Readings from Last 3 Encounters:  11/08/15 281 lb (127.5 kg)  05/30/15 289 lb 12.8 oz (131.5 kg)  03/21/15 292 lb 6.4 oz (132.6 kg)    Glycemic control:   Lab Results  Component Value Date   HGBA1C 10.2 (H) 11/05/2015   HGBA1C 8.4 03/05/2015   HGBA1C 9.9 12/14/2014   Lab Results  Component Value Date   MICROALBUR 1.2 11/05/2015   LDLCALC 42 04/18/2014   CREATININE 0.92 11/05/2015    Lab on 11/05/2015  Component Date Value Ref Range Status  . Cholesterol 11/05/2015 128  0 - 200 mg/dL Final  . Triglycerides 11/05/2015 266.0* 0.0 - 149.0 mg/dL Final  . HDL 11/05/2015 25.40* >39.00 mg/dL Final  . VLDL 11/05/2015 53.2* 0.0 - 40.0 mg/dL Final  . Total CHOL/HDL Ratio 11/05/2015 5   Final  . NonHDL 11/05/2015 102.81   Final  . Hgb A1c MFr Bld 11/05/2015 10.2* 4.6 - 6.5 % Final  . Direct LDL 11/05/2015 77.0  mg/dL Final  . Sodium 11/05/2015 134* 135 - 145 mEq/L Final  . Potassium 11/05/2015 4.8  3.5 - 5.1 mEq/L Final  . Chloride 11/05/2015 98  96 - 112 mEq/L Final  . CO2 11/05/2015 26  19  - 32 mEq/L Final  . Glucose, Bld 11/05/2015 331* 70 - 99 mg/dL Final  . BUN 11/05/2015 12  6 - 23 mg/dL Final  . Creatinine, Ser 11/05/2015 0.92  0.40 - 1.50 mg/dL Final  . Calcium 11/05/2015 9.1  8.4 - 10.5 mg/dL Final  . GFR 11/05/2015 96.68  >60.00 mL/min Final  . Microalb, Ur 11/05/2015 1.2  0.0 - 1.9 mg/dL Final  . Creatinine,U 11/05/2015 62.1  mg/dL Final  . Microalb Creat Ratio 11/05/2015 1.9  0.0 - 30.0 mg/g Final        Medication List       Accurate as of 11/08/15  2:52 PM. Always use your most recent med list.          atorvastatin 10 MG tablet Commonly known as:  LIPITOR Take 1 tablet (10 mg total) by mouth daily.   BASAGLAR KWIKPEN 100 UNIT/ML Sopn Inject 0.38 mLs (38 Units total) into the skin at bedtime.   Insulin Pen Needle 32G X 5 MM Misc Commonly known as:  NOVOTWIST Use one per day to inject victoza   Liraglutide 18 MG/3ML Sopn Commonly known as:  VICTOZA INJECT 1.2 MG DAILY   lisinopril 10 MG tablet Commonly known as:  PRINIVIL,ZESTRIL Take 1 tablet (10 mg total) by mouth daily.   meloxicam 15 MG tablet Commonly known as:  MOBIC Take 1 tablet (15 mg total) by mouth daily as needed for pain (with food).   metFORMIN 500 MG 24 hr tablet Commonly known as:  GLUCOPHAGE-XR Take 4 tablets (2,000 mg total) by mouth daily.   ONE TOUCH ULTRA 2 w/Device Kit Check fasting blood sugar and 2 hour post prandial BS in afternoon or evening. E11.9.   ONE TOUCH ULTRA TEST test strip Generic drug:  glucose blood CHECK FASTING BLOOD SUGAR AND 2 HOUR POST PRANDIAL IN AFTERNOON OR EVENING.   ONETOUCH DELICA LANCETS 78G Misc CHECK FASTING BLOOD SUGAR AND 2 HOUR POST PRANDIAL IN AFTERNOON OR EVENING.   OTEZLA 10 & 20 & 30 MG Tbpk Generic drug:  Apremilast Take by mouth. Reported on 05/30/2015   STELARA [place] Inject into the skin.   tiZANidine 4 MG tablet Commonly known as:  ZANAFLEX Take 1 tablet (4 mg total) by mouth Nightly.       Allergies: No Known  Allergies  Past Medical  History:  Diagnosis Date  . Diabetes mellitus without complication ([place])   . Elevated cholesterol   . Psoriasis     No past surgical history on file.  Family History  Problem Relation Age of Onset  . Diabetes Mother   . Diabetes Paternal Grandfather   . Hypertension Paternal Grandfather   . Heart disease Neg Hx   . Cancer Neg Hx     Social History:  reports that he has quit smoking. He has never used smokeless tobacco. He reports that he does not drink alcohol or use drugs.    Review of Systems    Lipid history: On treatment with Lipitor for the last 3-4 years with adequate control, HDL still low   Lab Results  Component Value Date   CHOL 128 11/05/2015   HDL 25.40 (L) 11/05/2015   LDLCALC 42 04/18/2014   LDLDIRECT 77.0 11/05/2015   TRIG 266.0 (H) 11/05/2015   CHOLHDL 5 11/05/2015           Most recent eye exam was in 3/17  Hypertension: Blood pressure is controlled with lisinopril prescribed by PCP   LABS:  Lab on 11/05/2015  Component Date Value Ref Range Status  . Cholesterol 11/05/2015 128  0 - 200 mg/dL Final  . Triglycerides 11/05/2015 266.0* 0.0 - 149.0 mg/dL Final  . HDL 11/05/2015 25.40* >39.00 mg/dL Final  . VLDL 11/05/2015 53.2* 0.0 - 40.0 mg/dL Final  . Total CHOL/HDL Ratio 11/05/2015 5   Final  . NonHDL 11/05/2015 102.81   Final  . Hgb A1c MFr Bld 11/05/2015 10.2* 4.6 - 6.5 % Final  . Direct LDL 11/05/2015 77.0  mg/dL Final  . Sodium 11/05/2015 134* 135 - 145 mEq/L Final  . Potassium 11/05/2015 4.8  3.5 - 5.1 mEq/L Final  . Chloride 11/05/2015 98  96 - 112 mEq/L Final  . CO2 11/05/2015 26  19 - 32 mEq/L Final  . Glucose, Bld 11/05/2015 331* 70 - 99 mg/dL Final  . BUN 11/05/2015 12  6 - 23 mg/dL Final  . Creatinine, Ser 11/05/2015 0.92  0.40 - 1.50 mg/dL Final  . Calcium 11/05/2015 9.1  8.4 - 10.5 mg/dL Final  . GFR 11/05/2015 96.68  >60.00 mL/min Final  . Microalb, Ur 11/05/2015 1.2  0.0 - 1.9 mg/dL Final  .  Creatinine,U 11/05/2015 62.1  mg/dL Final  . Microalb Creat Ratio 11/05/2015 1.9  0.0 - 30.0 mg/g Final    Physical Examination:  BP 118/74   Pulse 84   Ht 6' (1.829 m)   Wt 281 lb (127.5 kg)   SpO2 95%   BMI 38.11 kg/m        ASSESSMENT:  Diabetes type 2, uncontrolled with obesity See history of present illness for detailed discussion of his current management, blood sugar patterns and problems identified His blood sugars are Totally out of control now with A1c over 10% This is from his noncompliance with insulin, diet and medications over the last few months  Difficult to know what his blood sugar patterns are and he is checking readings mostly before breakfast and supper if he does and did not bring his monitor He was offered consultation with the dietitian again but he thinks he knows what to do  HYPERLIPIDEMIA: Well controlled   PLAN:    Start walking for exercise  Consistent diet  Check coverage for Tresiba  Check blood sugars consistently after meals  Increase evening Basaglar to 36 units, need to get fasting blood sugars at least  under 140  Discussed blood sugar targets at various times  Follow-up in 6 weeks to review blood sugar patterns and consider mealtime insulin if needed.  Discussed that he may have higher blood sugars after meals that are not covered by basal insulin and Victoza always  Check fructosamine on next visit  Patient Instructions  Basaglar 36 units at nite   Check cost of Tresiba  Check blood sugars on waking up  4x per week  Also check blood sugars about 2 hours after a meal and do this after different meals by rotation  Recommended blood sugar levels on waking up is 90-130 and about 2 hours after meal is 130-160  Please bring your blood sugar monitor to each visit, thank you    Counseling time on subjects discussed above is over 50% of today's 25 minute visit   Renato Spellman 11/08/2015, 2:52 PM   Note: This office note was  prepared with Dragon voice recognition system technology. Any transcriptional errors that result from this process are unintentional.

## 2015-12-16 ENCOUNTER — Other Ambulatory Visit (INDEPENDENT_AMBULATORY_CARE_PROVIDER_SITE_OTHER): Payer: Commercial Managed Care - HMO

## 2015-12-16 DIAGNOSIS — Z794 Long term (current) use of insulin: Secondary | ICD-10-CM

## 2015-12-16 DIAGNOSIS — E1165 Type 2 diabetes mellitus with hyperglycemia: Secondary | ICD-10-CM

## 2015-12-16 LAB — BASIC METABOLIC PANEL
BUN: 12 mg/dL (ref 6–23)
CO2: 31 mEq/L (ref 19–32)
Calcium: 8.9 mg/dL (ref 8.4–10.5)
Chloride: 99 mEq/L (ref 96–112)
Creatinine, Ser: 0.93 mg/dL (ref 0.40–1.50)
GFR: 95.43 mL/min (ref >60.00)
Glucose, Bld: 243 mg/dL — ABNORMAL HIGH (ref 70–99)
Potassium: 4.1 mEq/L (ref 3.5–5.1)
Sodium: 137 mEq/L (ref 135–145)

## 2015-12-17 LAB — FRUCTOSAMINE: Fructosamine: 322 umol/L — ABNORMAL HIGH (ref 0–285)

## 2015-12-19 ENCOUNTER — Other Ambulatory Visit: Payer: Self-pay

## 2015-12-19 ENCOUNTER — Other Ambulatory Visit: Payer: Self-pay | Admitting: Family Medicine

## 2015-12-19 ENCOUNTER — Encounter: Payer: Self-pay | Admitting: Endocrinology

## 2015-12-19 ENCOUNTER — Ambulatory Visit (INDEPENDENT_AMBULATORY_CARE_PROVIDER_SITE_OTHER): Payer: Commercial Managed Care - HMO | Admitting: Endocrinology

## 2015-12-19 VITALS — BP 142/77 | HR 86 | Ht 73.0 in | Wt 286.0 lb

## 2015-12-19 DIAGNOSIS — E1165 Type 2 diabetes mellitus with hyperglycemia: Secondary | ICD-10-CM | POA: Diagnosis not present

## 2015-12-19 DIAGNOSIS — Z794 Long term (current) use of insulin: Secondary | ICD-10-CM | POA: Diagnosis not present

## 2015-12-19 LAB — HM DIABETES FOOT EXAM

## 2015-12-19 MED ORDER — INSULIN DEGLUDEC 100 UNIT/ML ~~LOC~~ SOPN: 45.0000 [IU] | PEN_INJECTOR | Freq: Every day | SUBCUTANEOUS | 2 refills | Status: DC

## 2015-12-19 MED ORDER — INSULIN LISPRO 100 UNIT/ML ~~LOC~~ SOLN: 8.0000 [IU] | Freq: Three times a day (TID) | SUBCUTANEOUS | 3 refills | Status: DC

## 2015-12-19 NOTE — Progress Notes (Signed)
Patient ID: Todd Gaines, male   DOB: March 04, 1976, 40 y.o.   MRN: [id number]           Reason for Appointment: Follow-up for Type 2 Diabetes  Referring physician: [facility]  History of Present Illness:          Date of diagnosis of type 2 diabetes mellitus: 2012       Background history:   He believes her A1c was 12% when he was first diagnosed to have diabetes and was having symptoms of feeling very tired. Initially he made changes in his lifestyle and was able to lose some weight also He was probably treated with metformin and may have been given Tradjenta subsequently Previous records are not available He thinks his A1c had been around 7-7.5% for some time until last year  Recent history:   INSULIN dosage: 25 Basaglar hs, 20 Basaglar in am  Non-insulin hypoglycemic drugs the patient is taking are: Metformin ER 2000 mg a day, Victoza 1.5 mg daily  He was started on basal insulin in 9/16 when his A1c was 9.9, now taking Basaglar which was preferred on his insurance  He has not used the V-go pump because of lack of adequate insurance coverage and out-of-pocket expense, this was improving his control FRUCTOSAMINE is high at 222  Current blood sugar patterns and problems identified:   He was told to titrate his evening Basaglar based on his fasting readings and take at least 86 units but he is still taking only 25.  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      Blood Glucose readings by recall  Mean values apply above for all meters except median for One Touch  PRE-MEAL Fasting Lunch Dinner Bedtime Overall  Glucose range: 143-292       Mean/median: 209        Self-care:  Typical meal intake: Breakfast is peanut butter sandwich  Snacks will be peanut butter crackers, chips, pretzels and peanut butter               Dietician visit, most recent: 10/16               Exercise: more active, walking   Weight history: 275-298 lbs  Wt Readings from Last 3 Encounters:  12/19/15 286 lb (129.7 kg)  11/08/15 281 lb (127.5 kg)  05/30/15 289 lb 12.8 oz (131.5 kg)    Glycemic control:   Lab Results  Component Value Date   HGBA1C 10.2 (H) 11/05/2015   HGBA1C 8.4 03/05/2015   HGBA1C 9.9 12/14/2014   Lab Results  Component Value Date   MICROALBUR 1.2 11/05/2015   LDLCALC 42 04/18/2014   CREATININE 0.93 12/16/2015    Lab on 12/16/2015  Component Date Value Ref Range Status  . Fructosamine 12/17/2015 322* 0 - 285 umol/L Final   Comment: Published reference interval for apparently healthy subjects between age 55 and 82 is 67 - 285 umol/L and in a poorly controlled diabetic population is 228 - 563 umol/L with a mean of 396 umol/L.   Marland Kitchen Sodium 12/16/2015 137  135 - 145 mEq/L Final  . Potassium 12/16/2015 4.1  3.5 - 5.1 mEq/L Final  . Chloride 12/16/2015 99  96 - 112 mEq/L Final  . CO2 12/16/2015 31  19 - 32 mEq/L Final  . Glucose, Bld 12/16/2015 243* 70 - 99 mg/dL Final  . BUN 12/16/2015 12  6 - 23 mg/dL Final  . Creatinine, Ser 12/16/2015 0.93  0.40 - 1.50 mg/dL Final    . Calcium 12/16/2015 8.9  8.4 - 10.5 mg/dL Final  . GFR 12/16/2015 95.43  >60.00 mL/min Final        Medication List       Accurate as of 12/19/15  6:38 PM. Always use your most recent med list.          atorvastatin 10 MG tablet Commonly known as:  LIPITOR Take 1 tablet (10 mg total) by mouth daily.   BASAGLAR KWIKPEN 100 UNIT/ML Sopn Inject 0.38 mLs (38 Units total) into the skin at bedtime.   insulin degludec 100 UNIT/ML Sopn FlexTouch Pen Commonly known as:  TRESIBA FLEXTOUCH Inject 0.45 mLs (45 Units total) into the skin daily.   insulin lispro 100 UNIT/ML injection Commonly known as:  HUMALOG Inject 0.08 mLs (8 Units total) into the skin 3 (three) times daily before meals.   Insulin Pen Needle 32G X 5 MM Misc Commonly known as:  NOVOTWIST Use one per day to inject victoza   Liraglutide 18 MG/3ML Sopn Commonly known as:  VICTOZA INJECT 1.2 MG DAILY   lisinopril 10 MG tablet Commonly known as:  PRINIVIL,ZESTRIL Take 1 tablet (10 mg total) by mouth daily.   meloxicam 15 MG tablet Commonly known as:  MOBIC Take 1 tablet (15 mg total) by mouth daily as needed for pain (with food).   metFORMIN 500 MG 24 hr tablet Commonly known as:  GLUCOPHAGE-XR Take 4 tablets (2,000 mg total) by mouth daily.   ONE TOUCH ULTRA 2 w/Device Kit Check fasting blood sugar and 2 hour post prandial BS in afternoon or evening. E11.9.   ONE TOUCH ULTRA TEST test strip Generic drug:  glucose blood CHECK FASTING BLOOD SUGAR AND 2 HOUR POST PRANDIAL IN AFTERNOON OR EVENING.   ONETOUCH DELICA LANCETS 67E Misc CHECK FASTING BLOOD SUGAR AND 2 HOUR POST PRANDIAL IN AFTERNOON OR EVENING.   OTEZLA 10 & 20 & 30 MG Tbpk Generic drug:  Apremilast Take by mouth. Reported on 05/30/2015   STELARA [place] Inject into the skin.   tiZANidine 4 MG tablet Commonly known as:  ZANAFLEX Take 1 tablet (4 mg total) by mouth Nightly.       Allergies: No Known Allergies  Past Medical History:   Diagnosis Date  . Diabetes mellitus without complication ([place])   . Elevated cholesterol   . Psoriasis     No past surgical history on file.  Family History  Problem Relation Age of Onset  . Diabetes Mother   . Diabetes Paternal Grandfather   . Hypertension Paternal Grandfather   . Heart disease Neg Hx   . Cancer Neg Hx     Social History:  reports that he has quit smoking. He has never used smokeless tobacco. He reports that he does not drink alcohol or use drugs.    Review of Systems    Lipid history: On treatment with Lipitor for the last 3-4 years with adequate control, HDL Stays low and triglycerides not at target   Lab Results  Component Value Date   CHOL 128 11/05/2015   HDL 25.40 (L) 11/05/2015   LDLCALC 42 04/18/2014   LDLDIRECT 77.0 11/05/2015   TRIG 266.0 (H) 11/05/2015   CHOLHDL 5 11/05/2015           Most recent eye exam was in 3/17  Hypertension: Blood pressure is controlled with lisinopril prescribed by PCP   LABS:  Lab on 12/16/2015  Component Date Value Ref Range Status  . Fructosamine 12/17/2015 322* 0 - 285 umol/L Final   Comment: Published reference interval for apparently healthy subjects between age 19 and 31 is 53 - 285 umol/L and in a poorly controlled diabetic population is 228 - 563 umol/L with a mean of 396 umol/L.   Marland Kitchen Sodium 12/16/2015 137  135 - 145 mEq/L Final  . Potassium 12/16/2015 4.1  3.5 - 5.1 mEq/L Final  . Chloride 12/16/2015 99  96 - 112 mEq/L Final  . CO2 12/16/2015 31  19 - 32 mEq/L Final  . Glucose, Bld 12/16/2015 243* 70 - 99 mg/dL Final  . BUN 12/16/2015 12  6 - 23 mg/dL Final  . Creatinine, Ser 12/16/2015 0.93  0.40 - 1.50 mg/dL Final  . Calcium 12/16/2015 8.9  8.4 - 10.5 mg/dL Final  . GFR 12/16/2015 95.43  >60.00 mL/min Final    Physical Examination:  BP (!) 142/77   Pulse 86   Ht '6\' 1"'$  (1.854 m)   Wt 286 lb (129.7 kg)   BMI 37.73 kg/m        ASSESSMENT:  Diabetes type 2, uncontrolled with  obesity See history of present illness for detailed discussion of his current management, blood sugar patterns and problems identified His blood sugars are Still not well controlled now Although he has done better with compliance with his insulin he is not taking the prescribed doses He still does not adjust his evening basal insulin based on fasting blood sugars which are averaging 221 recently Glucose monitoring is inadequate and not doing any readings after meals His diet has been inconsistent also Has gained weight  PLAN:    Consistent diet  Consistent exercise  Change Basaglar to [place] when finished and started with 45 units once a day, may need to increase this if fasting readings are not controlled  Start HUMALOG at suppertime since most likely he is having post prandial hyperglycemia in the evenings.  Discussed how basal and bolus insulins are different and to try and target his bedtime readings with adjusting suppertime dose  He can also check readings after breakfast and lunch on weekends  Consider adding Invokana on the next visit  Discussed blood sugars at various times  Victoza 1.8 mg daily  Check blood sugars consistently after meals  Review blood sugars by phone in 2 weeks  Patient Instructions  Check blood sugars on waking up 3x weekly  Also check blood sugars about 2 hours after a meal  and do this after different meals by rotation  Recommended blood sugar levels on waking up is 90-130 and about 2 hours after meal is 130-160  Please bring your blood sugar monitor to each visit, thank you  Novolog 8 units before supper daily and adjust to keep bedtime sugar below 160  Tresiba 45 units once daily, replaces Basaglar  Walk daily  Call in 2 weeks       Counseling time on subjects discussed above is over 50% of today's 25 minute visit   Tiwana Chavis 12/19/2015, 6:38 PM   Note: This office note was prepared with Dragon voice recognition system  technology. Any transcriptional errors that result from this process are unintentional.

## 2015-12-19 NOTE — Patient Instructions (Signed)
Check blood sugars on waking up 3x weekly  Also check blood sugars about 2 hours after a meal and do this after different meals by rotation  Recommended blood sugar levels on waking up is 90-130 and about 2 hours after meal is 130-160  Please bring your blood sugar monitor to each visit, thank you  Novolog 8 units before supper daily and adjust to keep bedtime sugar below 160  Tresiba 45 units once daily, replaces Universal HealthBasaglar  Walk daily  Call in 2 weeks

## 2015-12-20 NOTE — Telephone Encounter (Signed)
Office visit scheduled for 04/08/2016 at 3:30pm.  Patient aware and refills sent in to carry him through appointment.

## 2015-12-20 NOTE — Telephone Encounter (Signed)
Please schedule a 30 min appt this winter and refill until then  Thanks

## 2015-12-20 NOTE — Telephone Encounter (Signed)
No recent or future appts please advise  

## 2016-01-03 ENCOUNTER — Other Ambulatory Visit: Payer: Self-pay | Admitting: *Deleted

## 2016-01-03 MED ORDER — INSULIN GLARGINE 300 UNIT/ML ~~LOC~~ SOPN: 45.0000 [IU] | PEN_INJECTOR | Freq: Every day | SUBCUTANEOUS | 3 refills | Status: DC

## 2016-01-13 ENCOUNTER — Other Ambulatory Visit: Payer: Self-pay | Admitting: Endocrinology

## 2016-01-16 ENCOUNTER — Ambulatory Visit: Payer: Self-pay | Admitting: Endocrinology

## 2016-02-03 ENCOUNTER — Ambulatory Visit: Payer: Self-pay | Admitting: Endocrinology

## 2016-02-18 ENCOUNTER — Ambulatory Visit: Payer: Self-pay | Admitting: Endocrinology

## 2016-03-19 ENCOUNTER — Ambulatory Visit: Payer: Self-pay | Admitting: Endocrinology

## 2016-04-08 ENCOUNTER — Encounter: Payer: Self-pay | Admitting: Family Medicine

## 2016-04-08 ENCOUNTER — Other Ambulatory Visit: Payer: Self-pay | Admitting: Endocrinology

## 2016-04-08 ENCOUNTER — Ambulatory Visit (INDEPENDENT_AMBULATORY_CARE_PROVIDER_SITE_OTHER): Payer: Commercial Managed Care - HMO | Admitting: Family Medicine

## 2016-04-08 VITALS — BP 116/68 | HR 85 | Temp 97.6°F | Ht 72.0 in | Wt 287.8 lb

## 2016-04-08 DIAGNOSIS — R41840 Attention and concentration deficit: Secondary | ICD-10-CM | POA: Insufficient documentation

## 2016-04-08 DIAGNOSIS — I10 Essential (primary) hypertension: Secondary | ICD-10-CM | POA: Diagnosis not present

## 2016-04-08 DIAGNOSIS — E1169 Type 2 diabetes mellitus with other specified complication: Secondary | ICD-10-CM

## 2016-04-08 DIAGNOSIS — Z23 Encounter for immunization: Secondary | ICD-10-CM

## 2016-04-08 DIAGNOSIS — E119 Type 2 diabetes mellitus without complications: Secondary | ICD-10-CM

## 2016-04-08 DIAGNOSIS — Z6839 Body mass index (BMI) 39.0-39.9, adult: Secondary | ICD-10-CM

## 2016-04-08 DIAGNOSIS — E6609 Other obesity due to excess calories: Secondary | ICD-10-CM

## 2016-04-08 DIAGNOSIS — IMO0001 Reserved for inherently not codable concepts without codable children: Secondary | ICD-10-CM

## 2016-04-08 DIAGNOSIS — E785 Hyperlipidemia, unspecified: Secondary | ICD-10-CM

## 2016-04-08 DIAGNOSIS — Z794 Long term (current) use of insulin: Secondary | ICD-10-CM

## 2016-04-08 DIAGNOSIS — E1165 Type 2 diabetes mellitus with hyperglycemia: Secondary | ICD-10-CM

## 2016-04-08 DIAGNOSIS — F909 Attention-deficit hyperactivity disorder, unspecified type: Secondary | ICD-10-CM | POA: Insufficient documentation

## 2016-04-08 MED ORDER — LISINOPRIL 10 MG PO TABS: 10.0000 mg | ORAL_TABLET | Freq: Every day | ORAL | 3 refills | Status: DC

## 2016-04-08 MED ORDER — TETANUS-DIPHTH-ACELL PERTUSSIS 5-2.5-18.5 LF-MCG/0.5 IM SUSP
0.5000 mL | Freq: Once | INTRAMUSCULAR | Status: AC
  Administered 2016-04-08: 0.5 mL via INTRAMUSCULAR

## 2016-04-08 MED ORDER — ATORVASTATIN CALCIUM 10 MG PO TABS: 10.0000 mg | ORAL_TABLET | Freq: Every day | ORAL | 3 refills | Status: DC

## 2016-04-08 NOTE — Progress Notes (Signed)
Subjective:    Patient ID: Todd BeamsJeremy C Gaines, male    DOB: 08-07-1975, 41 y.o.   MRN: [id number]  HPI Here for f/u of chronic health problems   Moved a small distance since last visit - just 10 minutes   Has felt ok overall    Wt Readings from Last 3 Encounters:  04/08/16 287 lb 12 oz (130.5 kg)  12/19/15 286 lb (129.7 kg)  11/08/15 281 lb (127.5 kg)  has an active job walking  Working on diet  bmi 39.0  Diabetes-sees endocrinology Dr Lucianne MussKumar  Not as compliant as he should be and has had medicine adjustments  Lost time for self care with his kid's schedule  He plans to work harder on meal prep  Last A1C was high - low 9s  Was doing better for a while   He had to take prednisone for poison oak      bp is stable today  No cp or palpitations or headaches or edema  No side effects to medicines  BP Readings from Last 3 Encounters:  04/08/16 116/68  12/19/15 (!) 142/77  11/08/15 118/74      Had flu shot 9/17  Hx of hyperlipidemia Lab Results  Component Value Date   CHOL 128 11/05/2015   HDL 25.40 (L) 11/05/2015   LDLCALC 42 04/18/2014   LDLDIRECT 77.0 11/05/2015   TRIG 266.0 (H) 11/05/2015   CHOLHDL 5 11/05/2015   Atorvastatin off for over 6 mo - ran out  Trig were high - sugar was high  Still on it for this blood check     Chemistry      Component Value Date/Time   NA 137 12/16/2015 0911   K 4.1 12/16/2015 0911   CL 99 12/16/2015 0911   CO2 31 12/16/2015 0911   BUN 12 12/16/2015 0911   CREATININE 0.93 12/16/2015 0911      Component Value Date/Time   CALCIUM 8.9 12/16/2015 0911   ALKPHOS 88 04/18/2014 1714   AST 11 04/18/2014 1714   ALT 10 04/18/2014 1714   BILITOT 0.4 04/18/2014 1714        Going to school now-problems with concentration  Construction management/geomatics  Stays very organized  Tries to minimize distractions  Tries to get enough sleep  May need more exercise  Glucose is high   No mood issues   Daughter has adhd      Review of Systems    Review of Systems  Constitutional: Negative for fever, appetite change, fatigue and unexpected weight change.  Eyes: Negative for pain and visual disturbance.  Respiratory: Negative for cough and shortness of breath.   Cardiovascular: Negative for cp or palpitations    Gastrointestinal: Negative for nausea, diarrhea and constipation.  Genitourinary: Negative for urgency and frequency.  Skin: Negative for pallor or rash   Neurological: Negative for weakness, light-headedness, numbness and headaches.  Hematological: Negative for adenopathy. Does not bruise/bleed easily.  Psychiatric/Behavioral: Negative for dysphoric mood. The patient is not nervous/anxious.  pos for concentration problems /esp with work and school     Objective:   Physical Exam  Constitutional: He appears well-developed and well-nourished. No distress.  obese and well appearing   HENT:  Head: Normocephalic and atraumatic.  Mouth/Throat: Oropharynx is clear and moist.  Eyes: Conjunctivae and EOM are normal. Pupils are equal, round, and reactive to light.  Neck: Normal range of motion. Neck supple. No JVD present. Carotid bruit is not present. No thyromegaly present.  Cardiovascular: Normal  rate, regular rhythm, normal heart sounds and intact distal pulses.  Exam reveals no gallop.   Pulmonary/Chest: Effort normal and breath sounds normal. No respiratory distress. He has no wheezes. He has no rales.  No crackles  Abdominal: Soft. Bowel sounds are normal. He exhibits no distension, no abdominal bruit and no mass. There is no tenderness.  Musculoskeletal: He exhibits no edema.  Lymphadenopathy:    He has no cervical adenopathy.  Neurological: He is alert. He has normal reflexes.  Skin: Skin is warm and dry. No rash noted.  Ruddy complexion   Psychiatric: His speech is normal and behavior is normal. Thought content normal. His mood appears not anxious. His affect is not blunt and not labile.  He does not exhibit a depressed mood.  Nl affect Attentive during visit           Assessment & Plan:   Problem List Items Addressed This Visit      Cardiovascular and Mediastinum   Essential hypertension, benign - Primary    bp in fair control at this time  BP Readings from Last 1 Encounters:  04/08/16 116/68   No changes needed Disc lifstyle change with low sodium diet and exercise  Labs reviewed from last draw in epic Wt loss enc  F/u for DM enc  Refilled lisinopril which works well  Lab in 2 mo      Relevant Medications   atorvastatin (LIPITOR) 10 MG tablet   lisinopril (PRINIVIL,ZESTRIL) 10 MG tablet     Endocrine   Diabetes type 2, controlled (HCC)    Pt sees Dr Lucianne Muss Control goes up and down with lifestyle habits  Disc strategies for diet/exercise and wt loss  utd eye and foot care Tdap today  utd other imms        Relevant Medications   atorvastatin (LIPITOR) 10 MG tablet   lisinopril (PRINIVIL,ZESTRIL) 10 MG tablet   Hyperlipidemia associated with type 2 diabetes mellitus (HCC)    Off med since summer since he could not get in for a visit   Px atorvastatin  Rev last profile Re check 2 months Disc low trans/sat fat diet and risks of high cholesterol with DM and goals for control Hope for improved triglycerides with better glucose control      Relevant Medications   atorvastatin (LIPITOR) 10 MG tablet   lisinopril (PRINIVIL,ZESTRIL) 10 MG tablet     Other   Lack of concentration    In setting of family hx (daughter) Denies mood problems Admits to poor health habits and poorly controlled DM Has had problems lifelong-now back in school  Disc lifestyle change incl organization/ minimizing distractions/not multi tasking/better nutrition and blood glucose control  He denies mood changes  May consider ADD testing in the future -will let us know if he wants a referral       Obesity    Discussed how this problem influences overall health and the  risks it imposes  Reviewed plan for weight loss with lower calorie diet (via better food choices and also portion control or program like weight watchers) and exercise building up to or more than 30 minutes 5 days per week including some aerobic activity          Other Visit Diagnoses    Need for Tdap vaccination       Relevant Medications   Tdap (BOOSTRIX) injection 0.5 mL (Completed)

## 2016-04-08 NOTE — Assessment & Plan Note (Signed)
Pt sees Dr Lucianne MussKumar Control goes up and down with lifestyle habits  Disc strategies for diet/exercise and wt loss  utd eye and foot care Tdap today  utd other imms

## 2016-04-08 NOTE — Assessment & Plan Note (Signed)
In setting of family hx (daughter) Denies mood problems Admits to poor health habits and poorly controlled DM Has had problems lifelong-now back in school  Disc lifestyle change incl organization/ minimizing distractions/not multi tasking/better nutrition and blood glucose control  He denies mood changes  May consider ADD testing in the future -will let us know if he wants a referral

## 2016-04-08 NOTE — Assessment & Plan Note (Addendum)
Off med since summer since he could not get in for a visit   Px atorvastatin  Rev last profile Re check 2 months Disc low trans/sat fat diet and risks of high cholesterol with DM and goals for control Hope for improved triglycerides with better glucose control

## 2016-04-08 NOTE — Patient Instructions (Addendum)
If you become interested in ADD testing - let us know and we will refer you  Stay organized  Get lots of physical activity  Take frequent breaks    Get back on your atorvastatin -do not miss doses Blood pressure is good  Get back to Dr Lucianne MussKumar to get diabetes improved   Schedule fasting labs in 2 months   Tdap vaccine today

## 2016-04-08 NOTE — Assessment & Plan Note (Signed)
Discussed how this problem influences overall health and the risks it imposes  Reviewed plan for weight loss with lower calorie diet (via better food choices and also portion control or program like weight watchers) and exercise building up to or more than 30 minutes 5 days per week including some aerobic activity    

## 2016-04-08 NOTE — Assessment & Plan Note (Signed)
bp in fair control at this time  BP Readings from Last 1 Encounters:  04/08/16 116/68   No changes needed Disc lifstyle change with low sodium diet and exercise  Labs reviewed from last draw in epic Wt loss enc  F/u for DM enc  Refilled lisinopril which works well  Lab in 2 mo

## 2016-04-08 NOTE — Progress Notes (Signed)
Pre visit review using our clinic review tool, if applicable. No additional management support is needed unless otherwise documented below in the visit note. 

## 2016-04-09 ENCOUNTER — Ambulatory Visit: Payer: Self-pay | Admitting: Endocrinology

## 2016-04-24 ENCOUNTER — Other Ambulatory Visit (INDEPENDENT_AMBULATORY_CARE_PROVIDER_SITE_OTHER): Payer: Commercial Managed Care - HMO

## 2016-04-24 DIAGNOSIS — E1165 Type 2 diabetes mellitus with hyperglycemia: Secondary | ICD-10-CM | POA: Diagnosis not present

## 2016-04-24 DIAGNOSIS — Z794 Long term (current) use of insulin: Secondary | ICD-10-CM

## 2016-04-24 LAB — HEMOGLOBIN A1C: Hgb A1c MFr Bld: 10.2 % — ABNORMAL HIGH (ref 4.6–6.5)

## 2016-04-24 LAB — BASIC METABOLIC PANEL
BUN: 12 mg/dL (ref 6–23)
CO2: 29 mEq/L (ref 19–32)
Calcium: 9.1 mg/dL (ref 8.4–10.5)
Chloride: 103 mEq/L (ref 96–112)
Creatinine, Ser: 0.87 mg/dL (ref 0.40–1.50)
GFR: 102.88 mL/min (ref >60.00)
Glucose, Bld: 181 mg/dL — ABNORMAL HIGH (ref 70–99)
Potassium: 4.4 mEq/L (ref 3.5–5.1)
Sodium: 137 mEq/L (ref 135–145)

## 2016-04-29 ENCOUNTER — Encounter: Payer: Self-pay | Admitting: Endocrinology

## 2016-04-29 ENCOUNTER — Ambulatory Visit (INDEPENDENT_AMBULATORY_CARE_PROVIDER_SITE_OTHER): Payer: Commercial Managed Care - HMO | Admitting: Endocrinology

## 2016-04-29 VITALS — BP 108/74 | HR 104 | Ht 72.0 in | Wt 284.0 lb

## 2016-04-29 DIAGNOSIS — Z794 Long term (current) use of insulin: Secondary | ICD-10-CM | POA: Diagnosis not present

## 2016-04-29 DIAGNOSIS — E1165 Type 2 diabetes mellitus with hyperglycemia: Secondary | ICD-10-CM | POA: Diagnosis not present

## 2016-04-29 NOTE — Patient Instructions (Signed)
TOUJEO 50 UNITS at nite daily  Humalog 10-15 units before meals

## 2016-04-29 NOTE — Progress Notes (Signed)
Patient ID: Todd Gaines, male   DOB: December 04, 1975, 41 y.o.   MRN: [id number]           Reason for Appointment: Follow-up for Type 2 Diabetes  Referring physician: [facility]  History of Present Illness:          Date of diagnosis of type 2 diabetes mellitus: 2012       Background history:   He believes her A1c was 12% when he was first diagnosed to have diabetes and was having symptoms of feeling very tired. Initially he made changes in his lifestyle and was able to lose some weight also He was probably treated with metformin and may have been given Tradjenta subsequently Previous records are not available He thinks his A1c had been around 7-7.5% for some time until last year  Recent history:   INSULIN dosage: Toujeo 25 hs, 20 in am Humalog 10 acs  Non-insulin hypoglycemic drugs the patient is taking are: Metformin ER 2000 mg a day, Victoza 1.8 mg daily  He was started on basal insulin in 9/16 when his A1c was 9.9, now A1c is 10.2 again  Recently taking Toujeo instead of Basaglar Has not been seen back since October  He has not used the V-go pump because of lack of adequate insurance coverage and out-of-pocket expense, this was improving his control   Current blood sugar patterns and problems identified:   He was switched from [employer] to [place], he could not get [place] covered but he is still taking the Toujeo twice a day with same doses as before  Again he did not bring his monitor as directed to review his blood sugars  Most likely is not checking his blood sugars much and generally checking only in the morning and some before supper time  He thinks he is taking his Victoza 1.8 mg regularly  FASTING blood sugars are fairly consistently high recently  He was started on HUMALOG on his last visit with 10 units at suppertime but he does not adjusted based on what he is eating even when his blood sugars seem to be fairly high after supper at night  Does not check readings  after supper much but he thinks they are higher when he does not watch his diet  Again his blood sugars in the afternoon are variable based on his activity level and diet  He is still not consistent with diet and will eat fast food periodically.  He is somewhat inconsistent with his exercise or activity regimen, currently not doing as much physical activity with his work  Weight is about the same        Side effects from medications have been: Transient nausea from Trulicity  Compliance with the medical regimen: Fair    Glucose monitoring:  0-1 times a day         Glucometer: One Touch.      Blood Glucose readings by recall:  Mean values apply above for all meters except median for One Touch  PRE-MEAL Fasting Lunch Dinner Bedtime Overall  Glucose range: 160-189  140-230 150-270   Mean/median:         Self-care:  Typical meal intake: Breakfast is peanut butter sandwich  Snacks will be peanut butter crackers, chips, pretzels and peanut butter               Dietician visit, most recent: 10/16               Exercise: more active, walking  Weight history: 275-298 lbs  Wt Readings from Last 3 Encounters:  04/29/16 284 lb (128.8 kg)  04/08/16 287 lb 12 oz (130.5 kg)  12/19/15 286 lb (129.7 kg)    Glycemic control:   Lab Results  Component Value Date   HGBA1C 10.2 (H) 04/24/2016   HGBA1C 10.2 (H) 11/05/2015   HGBA1C 8.4 03/05/2015   Lab Results  Component Value Date   MICROALBUR 1.2 11/05/2015   LDLCALC 42 04/18/2014   CREATININE 0.87 04/24/2016    Lab on 04/24/2016  Component Date Value Ref Range Status  . Hgb A1c MFr Bld 04/24/2016 10.2* 4.6 - 6.5 % Final  . Sodium 04/24/2016 137  135 - 145 mEq/L Final  . Potassium 04/24/2016 4.4  3.5 - 5.1 mEq/L Final  . Chloride 04/24/2016 103  96 - 112 mEq/L Final  . CO2 04/24/2016 29  19 - 32 mEq/L Final  . Glucose, Bld 04/24/2016 181* 70 - 99 mg/dL Final  . BUN 04/24/2016 12  6 - 23 mg/dL Final  . Creatinine, Ser  04/24/2016 0.87  0.40 - 1.50 mg/dL Final  . Calcium 04/24/2016 9.1  8.4 - 10.5 mg/dL Final  . GFR 04/24/2016 102.88  >60.00 mL/min Final      Allergies as of 04/29/2016   No Known Allergies     Medication List       Accurate as of 04/29/16 11:59 PM. Always use your most recent med list.          atorvastatin 10 MG tablet Commonly known as:  LIPITOR Take 1 tablet (10 mg total) by mouth daily.   Insulin Glargine 300 UNIT/ML Sopn Commonly known as:  TOUJEO SOLOSTAR Inject 45 Units into the skin daily.   insulin lispro 100 UNIT/ML injection Commonly known as:  HUMALOG Inject 0.08 mLs (8 Units total) into the skin 3 (three) times daily before meals.   Insulin Pen Needle 32G X 5 MM Misc Commonly known as:  NOVOTWIST Use one per day to inject victoza   BD PEN NEEDLE NANO U/F 32G X 4 MM Misc Generic drug:  Insulin Pen Needle USE TO INJECT INSULIN ONCE A DAY   liraglutide 18 MG/3ML Sopn Commonly known as:  VICTOZA INJECT 1.2 MG DAILY   lisinopril 10 MG tablet Commonly known as:  PRINIVIL,ZESTRIL Take 1 tablet (10 mg total) by mouth daily.   metFORMIN 500 MG 24 hr tablet Commonly known as:  GLUCOPHAGE-XR TAKE FOUR TABLETS BY MOUTH DAILY   ONE TOUCH ULTRA 2 w/Device Kit Check fasting blood sugar and 2 hour post prandial BS in afternoon or evening. E11.9.   ONE TOUCH ULTRA TEST test strip Generic drug:  glucose blood CHECK FASTING BLOOD SUGAR AND 2 HOUR POST PRANDIAL IN AFTERNOON OR EVENING.   ONETOUCH DELICA LANCETS 38H Misc CHECK FASTING BLOOD SUGAR AND 2 HOUR POST PRANDIAL IN AFTERNOON OR EVENING.   STELARA [place] Inject into the skin.       Allergies: No Known Allergies  Past Medical History:  Diagnosis Date  . Diabetes mellitus without complication ([place])   . Elevated cholesterol   . Psoriasis     No past surgical history on file.  Family History  Problem Relation Age of Onset  . Diabetes Mother   . Diabetes Paternal Grandfather   . Hypertension  Paternal Grandfather   . Heart disease Neg Hx   . Cancer Neg Hx     Social History:  reports that he has quit smoking. He has never used smokeless  tobacco. He reports that he does not drink alcohol or use drugs.    Review of Systems    Lipid history: On treatment with Lipitor for the last 3-4 years with adequate control, HDL Stays low and triglycerides not at target   Lab Results  Component Value Date   CHOL 128 11/05/2015   HDL 25.40 (L) 11/05/2015   LDLCALC 42 04/18/2014   LDLDIRECT 77.0 11/05/2015   TRIG 266.0 (H) 11/05/2015   CHOLHDL 5 11/05/2015           Most recent eye exam was in 3/17  Hypertension: Blood pressure is controlled with lisinopril prescribed by PCP   LABS:  Lab on 04/24/2016  Component Date Value Ref Range Status  . Hgb A1c MFr Bld 04/24/2016 10.2* 4.6 - 6.5 % Final  . Sodium 04/24/2016 137  135 - 145 mEq/L Final  . Potassium 04/24/2016 4.4  3.5 - 5.1 mEq/L Final  . Chloride 04/24/2016 103  96 - 112 mEq/L Final  . CO2 04/24/2016 29  19 - 32 mEq/L Final  . Glucose, Bld 04/24/2016 181* 70 - 99 mg/dL Final  . BUN 04/24/2016 12  6 - 23 mg/dL Final  . Creatinine, Ser 04/24/2016 0.87  0.40 - 1.50 mg/dL Final  . Calcium 04/24/2016 9.1  8.4 - 10.5 mg/dL Final  . GFR 04/24/2016 102.88  >60.00 mL/min Final    Physical Examination:  BP 108/74   Pulse (!) 104   Ht 6' (1.829 m)   Wt 284 lb (128.8 kg)   SpO2 95%   BMI 38.52 kg/m        ASSESSMENT:  Diabetes type 2, uncontrolled with obesity See history of present illness for detailed discussion of his current management, blood sugar patterns and problems identified  Her A1c is still very high at 10.2% Also has not been seen regularly for follow-up Difficult to know what his blood sugar patterns are as he does not check his blood sugar much and does not bring his monitor for review  Most likely still having erratic control of his postprandial blood sugars early from not taking Humalog at mealtimes  and also watching his diet consistently eating fast food   PLAN:    Consistent diet, needs to review information given by dietitian previously and avoid fast food  Start titrating the [place] based on fasting blood sugars, given him flowsheet and explained how to do this every 3 days by 2 units  For now he can switch to Toujeo to the evening and start taking 50 units for convenience  He will need to take and to 15 units with every meal based on his activity level that is anticipated and also what he is planning to eat.  Cut back on fast food  Advised him to review the information the dietitian had given him before  Consider starting Invokana  More regular exercise  Start using the freestyle Libre sensor for better assessment of his blood sugar patterns, this should also help him modify his diet  Patient Instructions  TOUJEO 50 UNITS at nite daily  Humalog 10-15 units before meals   Counseling time on subjects discussed above is over 50% of today's 25 minute visit   Rubye Strohmeyer 04/30/2016, 10:04 AM   Note: This office note was prepared with Estate agent. Any transcriptional errors that result from this process are unintentional.

## 2016-05-15 DIAGNOSIS — E119 Type 2 diabetes mellitus without complications: Secondary | ICD-10-CM | POA: Diagnosis not present

## 2016-05-15 DIAGNOSIS — E089 Diabetes mellitus due to underlying condition without complications: Secondary | ICD-10-CM | POA: Diagnosis not present

## 2016-05-19 LAB — HM DIABETES EYE EXAM

## 2016-06-01 DIAGNOSIS — Z79899 Other long term (current) drug therapy: Secondary | ICD-10-CM | POA: Diagnosis not present

## 2016-06-01 DIAGNOSIS — L400 Psoriasis vulgaris: Secondary | ICD-10-CM | POA: Diagnosis not present

## 2016-06-04 ENCOUNTER — Telehealth: Payer: Self-pay | Admitting: Family Medicine

## 2016-06-04 DIAGNOSIS — E785 Hyperlipidemia, unspecified: Principal | ICD-10-CM

## 2016-06-04 DIAGNOSIS — E1169 Type 2 diabetes mellitus with other specified complication: Secondary | ICD-10-CM

## 2016-06-04 NOTE — Telephone Encounter (Signed)
-----   Message from Baldomero LamyNatasha C Chavers sent at 06/03/2016  3:29 PM EDT ----- Regarding: F/u labs Mon 3/26, need orders. Thanks! :-) Please order  future f/u labs for pt's upcoming lab appt. Thanks Rodney Boozeasha

## 2016-06-05 ENCOUNTER — Ambulatory Visit: Payer: Self-pay | Admitting: Endocrinology

## 2016-06-08 ENCOUNTER — Other Ambulatory Visit (INDEPENDENT_AMBULATORY_CARE_PROVIDER_SITE_OTHER): Payer: Commercial Managed Care - HMO

## 2016-06-08 DIAGNOSIS — E1169 Type 2 diabetes mellitus with other specified complication: Secondary | ICD-10-CM

## 2016-06-08 DIAGNOSIS — E785 Hyperlipidemia, unspecified: Secondary | ICD-10-CM

## 2016-06-08 LAB — LIPID PANEL
Cholesterol: 103 mg/dL (ref 0–200)
HDL: 24.8 mg/dL — ABNORMAL LOW (ref >39.00)
LDL Cholesterol: 57 mg/dL (ref 0–99)
NonHDL: 78.4
Total CHOL/HDL Ratio: 4
Triglycerides: 105 mg/dL (ref 0.0–149.0)
VLDL: 21 mg/dL (ref 0.0–40.0)

## 2016-06-08 LAB — ALT: ALT: 9 U/L (ref 0–53)

## 2016-06-08 LAB — AST: AST: 11 U/L (ref 0–37)

## 2016-06-09 ENCOUNTER — Encounter: Payer: Self-pay | Admitting: *Deleted

## 2016-06-10 ENCOUNTER — Ambulatory Visit (INDEPENDENT_AMBULATORY_CARE_PROVIDER_SITE_OTHER): Payer: Commercial Managed Care - HMO | Admitting: Endocrinology

## 2016-06-10 ENCOUNTER — Encounter: Payer: Self-pay | Admitting: Endocrinology

## 2016-06-10 VITALS — BP 122/78 | HR 93 | Ht 72.0 in | Wt 284.0 lb

## 2016-06-10 DIAGNOSIS — Z794 Long term (current) use of insulin: Secondary | ICD-10-CM | POA: Diagnosis not present

## 2016-06-10 DIAGNOSIS — E1165 Type 2 diabetes mellitus with hyperglycemia: Secondary | ICD-10-CM

## 2016-06-10 NOTE — Patient Instructions (Signed)
Toujeo 20 U in am  Supper Humalog 15 units  Reading after supper <180 max  Take shot at lunch unless using salad

## 2016-06-10 NOTE — Progress Notes (Signed)
Patient ID: Todd Gaines, male   DOB: 09/12/1975, 41 y.o.   MRN: [id number]           Reason for Appointment: Follow-up for Type 2 Diabetes  Referring physician: [facility]  History of Present Illness:          Date of diagnosis of type 2 diabetes mellitus: 2012       Background history:   He believes her A1c was 12% when he was first diagnosed to have diabetes and was having symptoms of feeling very tired. Initially he made changes in his lifestyle and was able to lose some weight also He was probably treated with metformin and may have been given Tradjenta subsequently Previous records are not available He thinks his A1c had been around 7-7.5% for some time until last year He was started on basal insulin in 9/16 when his A1c was 9.9 He did not use the V-go pump because of lack of adequate insurance coverage and out-of-pocket expense, this was improving his control  Recent history:   INSULIN dosage: Toujeo 0-50hs Humalog 10 acb 10-12 acs  Non-insulin hypoglycemic drugs the patient is taking are: Metformin ER 2000 mg a day, Victoza 1.8 mg daily  His last A1c is 10.2 in 04/2016  Recently started using the FreeStyle sensor  Current blood sugar patterns and problems identified:   He has been taking Toujeo now and was told to increase dose to 50 units in the last visit.  Also he was told to take Humalog with every meal  Although he thinks he is trying to do a little better with diet he still sometimes he is eating fast food in the afternoons  Since he does not always carry his insulin with him he may not take his insulin when eating out until he comes back home  HIGHEST blood sugars appear to be late evening; however he is not scanning his blood sugar sensor later in the evening consistently and not able to get a proper pattern of his blood sugar before and after evening meal  FASTING blood sugars are relatively better in the last couple of weeks although may be higher at times  with being 5-7 AM  POSTPRANDIAL readings are variable in the afternoon and sometimes as high as 250 based on his insulin regimen and diet  On his own he stopped taking his TOUJEO 4-5 days ago as he wanted to see how his sugars doing without it and surprisingly his fasting readings are not any higher recently  He is somewhat inconsistent with his exercise or activity regimen, currently not doing as much physical activity with his work  Weight is about the same        Side effects from medications have been: Transient nausea from Trulicity  Compliance with the medical regimen: Fair    Glucose monitoring:  0-1 times a day         Glucometer:  freestyle Libre Blood Glucose readings by   Mean values apply above for all meters except median for One Touch  PRE-MEAL Fasting Lunch Dinner Bedtime Overall  Glucose range:       Mean/median: 162  150  163  184  155+/-47    POST-MEAL PC Breakfast PC Lunch PC Dinner  Glucose range:     Mean/median:  177  222    : Self-care:  Typical meal intake: Breakfast is peanut butter sandwich  Snacks will be peanut butter crackers, chips, pretzels and peanut butter  Dietician visit, most recent: 10/16               Exercise: more active, walking   Weight history: 275-298 lbs  Wt Readings from Last 3 Encounters:  06/10/16 284 lb (128.8 kg)  04/29/16 284 lb (128.8 kg)  04/08/16 287 lb 12 oz (130.5 kg)    Glycemic control:   Lab Results  Component Value Date   HGBA1C 10.2 (H) 04/24/2016   HGBA1C 10.2 (H) 11/05/2015   HGBA1C 8.4 03/05/2015   Lab Results  Component Value Date   MICROALBUR 1.2 11/05/2015   LDLCALC 57 06/08/2016   CREATININE 0.87 04/24/2016    Lab on 06/08/2016  Component Date Value Ref Range Status  . AST 06/08/2016 11  0 - 37 U/L Final  . ALT 06/08/2016 9  0 - 53 U/L Final  . Cholesterol 06/08/2016 103  0 - 200 mg/dL Final  . Triglycerides 06/08/2016 105.0  0.0 - 149.0 mg/dL Final  . HDL 65/46/5035  24.80* >39.00 mg/dL Final  . VLDL 46/56/8127 21.0  0.0 - 40.0 mg/dL Final  . LDL Cholesterol 06/08/2016 57  0 - 99 mg/dL Final  . Total CHOL/HDL Ratio 06/08/2016 4   Final  . NonHDL 06/08/2016 78.40   Final      Allergies as of 06/10/2016   No Known Allergies     Medication List       Accurate as of 06/10/16 11:59 PM. Always use your most recent med list.          atorvastatin 10 MG tablet Commonly known as:  LIPITOR Take 1 tablet (10 mg total) by mouth daily.   Insulin Glargine 300 UNIT/ML Sopn Commonly known as:  TOUJEO SOLOSTAR Inject 45 Units into the skin daily.   insulin lispro 100 UNIT/ML injection Commonly known as:  HUMALOG Inject 0.08 mLs (8 Units total) into the skin 3 (three) times daily before meals.   Insulin Pen Needle 32G X 5 MM Misc Commonly known as:  NOVOTWIST Use one per day to inject victoza   BD PEN NEEDLE NANO U/F 32G X 4 MM Misc Generic drug:  Insulin Pen Needle USE TO INJECT INSULIN ONCE A DAY   liraglutide 18 MG/3ML Sopn Commonly known as:  VICTOZA INJECT 1.2 MG DAILY   lisinopril 10 MG tablet Commonly known as:  PRINIVIL,ZESTRIL Take 1 tablet (10 mg total) by mouth daily.   metFORMIN 500 MG 24 hr tablet Commonly known as:  GLUCOPHAGE-XR TAKE FOUR TABLETS BY MOUTH DAILY   ONE TOUCH ULTRA 2 w/Device Kit Check fasting blood sugar and 2 hour post prandial BS in afternoon or evening. E11.9.   ONE TOUCH ULTRA TEST test strip Generic drug:  glucose blood CHECK FASTING BLOOD SUGAR AND 2 HOUR POST PRANDIAL IN AFTERNOON OR EVENING.   ONETOUCH DELICA LANCETS 33G Misc CHECK FASTING BLOOD SUGAR AND 2 HOUR POST PRANDIAL IN AFTERNOON OR EVENING.   STELARA [place] Inject into the skin.       Allergies: No Known Allergies  Past Medical History:  Diagnosis Date  . Diabetes mellitus without complication ([facility])   . Elevated cholesterol   . Psoriasis     No past surgical history on file.  Family History  Problem Relation Age of Onset  .  Diabetes Mother   . Diabetes Paternal Grandfather   . Hypertension Paternal Grandfather   . Heart disease Neg Hx   . Cancer Neg Hx     Social History:  reports that he has  quit smoking. He has never used smokeless tobacco. He reports that he does not drink alcohol or use drugs.    Review of Systems    Lipid history: On treatment with Lipitor for the last 3-4 years with adequate control Has low HDL  Recent triglycerides improved   Lab Results  Component Value Date   CHOL 103 06/08/2016   HDL 24.80 (L) 06/08/2016   LDLCALC 57 06/08/2016   LDLDIRECT 77.0 11/05/2015   TRIG 105.0 06/08/2016   CHOLHDL 4 06/08/2016           Most recent eye exam was in 3/17  Hypertension: Blood pressure is controlled with lisinopril prescribed by PCP   LABS:  Lab on 06/08/2016  Component Date Value Ref Range Status  . AST 06/08/2016 11  0 - 37 U/L Final  . ALT 06/08/2016 9  0 - 53 U/L Final  . Cholesterol 06/08/2016 103  0 - 200 mg/dL Final  . Triglycerides 06/08/2016 105.0  0.0 - 149.0 mg/dL Final  . HDL 06/08/2016 24.80* >39.00 mg/dL Final  . VLDL 06/08/2016 21.0  0.0 - 40.0 mg/dL Final  . LDL Cholesterol 06/08/2016 57  0 - 99 mg/dL Final  . Total CHOL/HDL Ratio 06/08/2016 4   Final  . NonHDL 06/08/2016 78.40   Final    Physical Examination:  BP 122/78   Pulse 93   Ht 6' (1.829 m)   Wt 284 lb (128.8 kg)   SpO2 95%   BMI 38.52 kg/m        ASSESSMENT:  Diabetes type 2, uncontrolled with obesity See history of present illness for detailed discussion of his current management, blood sugar patterns and problems identified  His blood sugars appear to be overall improvement, as judged by his continuous glucose monitoring his average blood sugars 155 However he is not having consistent readings checked in the evenings and may be missing a lot of the high readings he has after supper Currently averaging 222 after supper Also having issues with not taking mealtime insulin at  lunchtime if he is eating carbohydrates or high fat foods  Surprisingly however his fasting readings are any higher this week after leaving off the Toujeo 4 days ago on his own He may be still benefiting from [place]: His triglycerides are improved, likely indication of somewhat improved diet and her blood sugars  PLAN:    Consistent diet, reduce fast food  Will try Toujeo again in the morning since he tends to have higher readings later in the day, use 20 units and adjust based on fasting blood sugars  He will need to take his Humalog pen with him for eating lunch unless he is just eating salads  Need to scan his sensor for blood sugars regularly especially at night to get complete picture of his postprandial patterns  Most likely needs 15 units of insulin for his evening meal unless eating a small low carbohydrate meals  Discussed blood sugars need to be at least under 180 after both lunch and supper  If he is able to get the V-go pump coverage from insurance this may be potentially useful although currently not appearing to be needing much basal insulin  More regular exercise  Patient Instructions  Toujeo 20 U in am  Supper Humalog 15 units  Reading after supper <180 max  Take shot at lunch unless using salad   Counseling time on subjects discussed above is over 50% of today's 25 minute visit   Aadarsh Cozort  06/11/2016, 1:59 PM   Note: This office note was prepared with Dragon voice recognition system technology. Any transcriptional errors that result from this process are unintentional.

## 2016-06-11 MED ORDER — LIRAGLUTIDE 18 MG/3ML ~~LOC~~ SOPN: PEN_INJECTOR | SUBCUTANEOUS | 3 refills | Status: DC

## 2016-07-07 ENCOUNTER — Other Ambulatory Visit: Payer: Self-pay | Admitting: Endocrinology

## 2016-07-16 ENCOUNTER — Telehealth: Payer: Self-pay | Admitting: Endocrinology

## 2016-07-16 NOTE — Telephone Encounter (Signed)
Refill of liraglutide (VICTOZA) 18 MG/3ML SOPN  [facility] [place], [place] - [address]

## 2016-07-17 ENCOUNTER — Encounter: Payer: Self-pay | Admitting: Family Medicine

## 2016-07-22 ENCOUNTER — Encounter: Payer: Self-pay | Admitting: Endocrinology

## 2016-07-27 ENCOUNTER — Other Ambulatory Visit: Payer: Self-pay

## 2016-07-27 ENCOUNTER — Telehealth: Payer: Self-pay | Admitting: Endocrinology

## 2016-07-27 MED ORDER — LIRAGLUTIDE 18 MG/3ML ~~LOC~~ SOPN: PEN_INJECTOR | SUBCUTANEOUS | 3 refills | Status: DC

## 2016-07-27 NOTE — Telephone Encounter (Signed)
Pt called and requested that we send in a 20 days supply of the Victoza with the 2 pen pack, his insurance will cover this one completely, the 3 pack is a $40 copay.  Needs it sent to the [facility] in [place].

## 2016-07-27 NOTE — Telephone Encounter (Signed)
Spoke with patient and he asked me to change it to 2 pen so he will have coverage through his insurance.

## 2016-07-30 NOTE — Telephone Encounter (Signed)
To clarify: Do you want to change victoza to tanzeum?

## 2016-07-30 NOTE — Telephone Encounter (Signed)
Patient ask if you could prescribe him Tanzeum  it is covered by his insurance. Please advise  [facility] [address], [place] - [id number] [address] [phone] (Phone) [phone] (Fax)

## 2016-07-30 NOTE — Telephone Encounter (Signed)
Could you advise during Dr. Remus BlakeKumar's absence? Thanks!

## 2016-07-31 ENCOUNTER — Other Ambulatory Visit: Payer: Self-pay | Admitting: Endocrinology

## 2016-07-31 MED ORDER — ALBIGLUTIDE 50 MG ~~LOC~~ PEN: 50.0000 mg | PEN_INJECTOR | SUBCUTANEOUS | 11 refills | Status: DC

## 2016-07-31 NOTE — Telephone Encounter (Signed)
Ok, I have sent a prescription to your pharmacy 

## 2016-07-31 NOTE — Telephone Encounter (Signed)
Left a vm requesting a call back to clarify

## 2016-07-31 NOTE — Telephone Encounter (Signed)
Yes patient is asking for tanzeum instead of victoza because of cost insurance will cover tanzeum

## 2016-07-31 NOTE — Telephone Encounter (Signed)
Left vm patient letting patient know the prescription has been sent to his pharmacy

## 2016-08-04 ENCOUNTER — Other Ambulatory Visit: Payer: Self-pay | Admitting: Endocrinology

## 2016-08-07 ENCOUNTER — Other Ambulatory Visit: Payer: Self-pay

## 2016-08-13 ENCOUNTER — Ambulatory Visit: Payer: Self-pay | Admitting: Endocrinology

## 2016-09-09 ENCOUNTER — Other Ambulatory Visit: Payer: Self-pay

## 2016-09-11 ENCOUNTER — Other Ambulatory Visit (INDEPENDENT_AMBULATORY_CARE_PROVIDER_SITE_OTHER): Payer: 59

## 2016-09-11 DIAGNOSIS — Z794 Long term (current) use of insulin: Secondary | ICD-10-CM | POA: Diagnosis not present

## 2016-09-11 DIAGNOSIS — E1165 Type 2 diabetes mellitus with hyperglycemia: Secondary | ICD-10-CM | POA: Diagnosis not present

## 2016-09-11 LAB — BASIC METABOLIC PANEL
BUN: 12 mg/dL (ref 6–23)
CO2: 29 mEq/L (ref 19–32)
Calcium: 9.2 mg/dL (ref 8.4–10.5)
Chloride: 101 mEq/L (ref 96–112)
Creatinine, Ser: 0.94 mg/dL (ref 0.40–1.50)
GFR: 93.91 mL/min (ref >60.00)
Glucose, Bld: 321 mg/dL — ABNORMAL HIGH (ref 70–99)
Potassium: 4.3 mEq/L (ref 3.5–5.1)
Sodium: 138 mEq/L (ref 135–145)

## 2016-09-11 LAB — HEMOGLOBIN A1C: Hgb A1c MFr Bld: 9.5 % — ABNORMAL HIGH (ref 4.6–6.5)

## 2016-09-15 ENCOUNTER — Ambulatory Visit: Payer: 59 | Admitting: Endocrinology

## 2016-09-21 ENCOUNTER — Ambulatory Visit (INDEPENDENT_AMBULATORY_CARE_PROVIDER_SITE_OTHER): Payer: 59 | Admitting: Endocrinology

## 2016-09-21 ENCOUNTER — Encounter: Payer: Self-pay | Admitting: Endocrinology

## 2016-09-21 VITALS — BP 122/84 | HR 95 | Ht 72.0 in | Wt 279.6 lb

## 2016-09-21 DIAGNOSIS — E1165 Type 2 diabetes mellitus with hyperglycemia: Secondary | ICD-10-CM | POA: Diagnosis not present

## 2016-09-21 DIAGNOSIS — Z794 Long term (current) use of insulin: Secondary | ICD-10-CM | POA: Diagnosis not present

## 2016-09-21 NOTE — Progress Notes (Signed)
Patient ID: Todd Gaines, male   DOB: 19-May-1975, 41 y.o.   MRN: [id number]           Reason for Appointment: Follow-up for Type 2 Diabetes  Referring physician: [facility]  History of Present Illness:          Date of diagnosis of type 2 diabetes mellitus: 2012       Background history:   He believes her A1c was 12% when he was first diagnosed to have diabetes and was having symptoms of feeling very tired. Initially he made changes in his lifestyle and was able to lose some weight also He was probably treated with metformin and may have been given Tradjenta subsequently Previous records are not available He thinks his A1c had been around 7-7.5% for some time until last year He was started on basal insulin in 9/16 when his A1c was 9.9 He did not use the V-go pump because of lack of adequate insurance coverage and out-of-pocket expense, this was improving his control  Recent history:   INSULIN dosage: Toujeo 40 hs Humalog 8 units at breakfast and supper  Non-insulin hypoglycemic drugs the patient is taking are: Metformin ER 2000 mg a day, Tanzeum 50 mg weekly Previously on Victoza 1.8 mg daily  His last A1c is 9.5, previously 10.2 in 04/2016   Current blood sugar patterns and problems identified:   He has been taking Toujeo irregularly and may take it only for 5 days a week as he forgets to do it at night  He did not bring his freestyle Libre sensor as he thinks it is lasting only about 5 days because of sweating but did not ask for any help on this  Even though he thinks his blood sugars are not high except after supper his A1c is clearly not much improved  Again he is sometimes eating significant amount of carbohydrate and not covering it at lunchtime as he does not take his Humalog with him  His insurance changed his Victoza to Tanzeum over a month ago  HIGHEST blood sugars appear after his evening meal but he still not taking enough Humalog and taking less insulin than he  was taking before  Glucose in the lab after breakfast was 321 and he cannot explain this, and may have not taken his Toujeo the night before  Although he is walking while at work he is not doing any formal exercise  Weight is about the same        Side effects from medications have been: Transient nausea from Trulicity  Compliance with the medical regimen: Fair    Glucose monitoring:  0-1 times a day         Glucometer:  freestyle Libre Blood Glucose readings by recall:  Mean values apply above for all meters except median for One Touch  PRE-MEAL Fasting Lunch Dinner Bedtime Overall  Glucose range:       Mean/median: 120-150  100 190     : Self-care:  Typical meal intake: Breakfast is eggs, sausages Snacks will be peanut butter crackers, chips, pretzels and peanut butter               Dietician visit, most recent: 10/16               Exercise: active, walking at work  Weight history: 275-298 lbs  Wt Readings from Last 3 Encounters:  09/21/16 279 lb 9.6 oz (126.8 kg)  06/10/16 284 lb (128.8 kg)  04/29/16 284 lb (  128.8 kg)    Glycemic control:   Lab Results  Component Value Date   HGBA1C 9.5 (H) 09/11/2016   HGBA1C 10.2 (H) 04/24/2016   HGBA1C 10.2 (H) 11/05/2015   Lab Results  Component Value Date   MICROALBUR 1.2 11/05/2015   LDLCALC 57 06/08/2016   CREATININE 0.94 09/11/2016    No visits with results within 1 Week(s) from this visit.  Latest known visit with results is:  Lab on 09/11/2016  Component Date Value Ref Range Status  . Sodium 09/11/2016 138  135 - 145 mEq/L Final  . Potassium 09/11/2016 4.3  3.5 - 5.1 mEq/L Final  . Chloride 09/11/2016 101  96 - 112 mEq/L Final  . CO2 09/11/2016 29  19 - 32 mEq/L Final  . Glucose, Bld 09/11/2016 321* 70 - 99 mg/dL Final  . BUN 09/11/2016 12  6 - 23 mg/dL Final  . Creatinine, Ser 09/11/2016 0.94  0.40 - 1.50 mg/dL Final  . Calcium 09/11/2016 9.2  8.4 - 10.5 mg/dL Final  . GFR 09/11/2016 93.91  >60.00 mL/min  Final  . Hgb A1c MFr Bld 09/11/2016 9.5* 4.6 - 6.5 % Final   Glycemic Control Guidelines for People with Diabetes:Non Diabetic:  <6%Goal of Therapy: <7%Additional Action Suggested:  >8%       Allergies as of 09/21/2016   No Known Allergies     Medication List       Accurate as of 09/21/16  9:59 AM. Always use your most recent med list.          Albiglutide 50 MG Pen Commonly known as:  TANZEUM Inject 50 mg into the skin once a week.   atorvastatin 10 MG tablet Commonly known as:  LIPITOR Take 1 tablet (10 mg total) by mouth daily.   Insulin Glargine 300 UNIT/ML Sopn Commonly known as:  TOUJEO SOLOSTAR Inject 45 Units into the skin daily.   insulin lispro 100 UNIT/ML injection Commonly known as:  HUMALOG Inject 0.08 mLs (8 Units total) into the skin 3 (three) times daily before meals.   Insulin Pen Needle 32G X 5 MM Misc Commonly known as:  NOVOTWIST Use one per day to inject victoza   BD PEN NEEDLE NANO U/F 32G X 4 MM Misc Generic drug:  Insulin Pen Needle USE TO INJECT INSULIN ONCE A DAY   lisinopril 10 MG tablet Commonly known as:  PRINIVIL,ZESTRIL Take 1 tablet (10 mg total) by mouth daily.   metFORMIN 500 MG 24 hr tablet Commonly known as:  GLUCOPHAGE-XR TAKE FOUR TABLETS BY MOUTH DAILY   ONE TOUCH ULTRA 2 w/Device Kit Check fasting blood sugar and 2 hour post prandial BS in afternoon or evening. E11.9.   ONE TOUCH ULTRA TEST test strip Generic drug:  glucose blood CHECK FASTING BLOOD SUGAR AND 2 HOUR POST PRANDIAL IN AFTERNOON OR EVENING.   ONETOUCH DELICA LANCETS 16X Misc CHECK FASTING BLOOD SUGAR AND 2 HOUR POST PRANDIAL IN AFTERNOON OR EVENING.   STELARA [place] Inject into the skin.       Allergies: No Known Allergies  Past Medical History:  Diagnosis Date  . Diabetes mellitus without complication ([place])   . Elevated cholesterol   . Psoriasis     No past surgical history on file.  Family History  Problem Relation Age of Onset  . Diabetes  Mother   . Diabetes Paternal Grandfather   . Hypertension Paternal Grandfather   . Heart disease Neg Hx   . Cancer Neg Hx  Social History:  reports that he has quit smoking. He has never used smokeless tobacco. He reports that he does not drink alcohol or use drugs.    Review of Systems    Lipid history: On treatment with Lipitor for Several years with adequate control Has low HDL But normal triglycerides   Lab Results  Component Value Date   CHOL 103 06/08/2016   HDL 24.80 (L) 06/08/2016   LDLCALC 57 06/08/2016   LDLDIRECT 77.0 11/05/2015   TRIG 105.0 06/08/2016   CHOLHDL 4 06/08/2016            Hypertension: Blood pressure is controlled with lisinopril prescribed by PCP  No retinopathy on his last exam in March   LABS:  No visits with results within 1 Week(s) from this visit.  Latest known visit with results is:  Lab on 09/11/2016  Component Date Value Ref Range Status  . Sodium 09/11/2016 138  135 - 145 mEq/L Final  . Potassium 09/11/2016 4.3  3.5 - 5.1 mEq/L Final  . Chloride 09/11/2016 101  96 - 112 mEq/L Final  . CO2 09/11/2016 29  19 - 32 mEq/L Final  . Glucose, Bld 09/11/2016 321* 70 - 99 mg/dL Final  . BUN 09/11/2016 12  6 - 23 mg/dL Final  . Creatinine, Ser 09/11/2016 0.94  0.40 - 1.50 mg/dL Final  . Calcium 09/11/2016 9.2  8.4 - 10.5 mg/dL Final  . GFR 09/11/2016 93.91  >60.00 mL/min Final  . Hgb A1c MFr Bld 09/11/2016 9.5* 4.6 - 6.5 % Final   Glycemic Control Guidelines for People with Diabetes:Non Diabetic:  <6%Goal of Therapy: <7%Additional Action Suggested:  >8%     Physical Examination:  BP 122/84   Pulse 95   Ht 6' (1.829 m)   Wt 279 lb 9.6 oz (126.8 kg)   SpO2 97%   BMI 37.92 kg/m        ASSESSMENT:  Diabetes type 2, uncontrolled with obesity See history of present illness for detailed discussion of his current management, blood sugar patterns and problems identified  His A1c is 9.5 and not much better than before Problems  identified are inconsistent regimen of basal insulin, not taking enough mealtime insulin especially at lunchtime, inconsistent diet that sometimes high-fat intake, lack of formal exercise, switching to Tanzeum instead of Victoza because of insurance issues Discussed and identified these problems with the patient and need for better glucose monitoring He does need to improve his lifestyle as discussed today and also probably get more effective treatment regimen that he can comply with also Discussed that Victoza is not as effective as Tanzeum  PLAN:    Consistent diet, reduce high-fat intake  He can take Toujeo right at suppertime for better compliance  Discussed in detail again the use of the V-go pump and he is going to check into the coverage for this  When he starts this he will use the 30 unit basal initially  Discussed needing to increase his mealtime doses, probably told to 14 at suppertime and 8-10 at lunchtime if eating a complete meal since he is more active during the day  He will use the skin tac sample for his Elenor [place] to help with adhesion  Switch to Ozempic if covered, otherwise he'll try to get Victoza again; he will need maximum doses of either agent  He will need to carry his Humalog with him for lunch  Start brisk walking for exercise regular exercise  Patient Instructions  Check Rx for Ozempic  and V-go  Toujeo at supper 40 U; use 30 Unit pump  Take 12-14 Humalog at supper and 8-10 at lunch  With pump take same # of clicks         Counseling time on subjects discussed in assessment and plan sections is over 50% of today's 25 minute visit   Ellia Knowlton 09/21/2016, 9:59 AM   Note: This office note was prepared with Estate agent. Any transcriptional errors that result from this process are unintentional.

## 2016-09-21 NOTE — Patient Instructions (Addendum)
Check Rx for Ozempic and V-go  Toujeo at supper 40 U; use 30 Unit pump  Take 12-14 Humalog at supper and 8-10 at lunch  With pump take same # of clicks

## 2016-10-01 ENCOUNTER — Telehealth: Payer: Self-pay | Admitting: Endocrinology

## 2016-10-01 ENCOUNTER — Other Ambulatory Visit: Payer: Self-pay

## 2016-10-01 MED ORDER — V-GO 30 KIT: PACK | 1 refills | Status: DC

## 2016-10-01 MED ORDER — INSULIN LISPRO 100 UNIT/ML ~~LOC~~ SOLN: SUBCUTANEOUS | 3 refills | Status: DC

## 2016-10-01 NOTE — Telephone Encounter (Signed)
Please advise 

## 2016-10-01 NOTE — Telephone Encounter (Signed)
He will start with the 30 unit basal for the V-go pump and he will also need the fast acting insulin and the vial instead of pen.  Also please schedule him for follow-up within 1 month without labs

## 2016-10-01 NOTE — Telephone Encounter (Signed)
Called patient and sending VGo 30 kit to [facility] for the first month, then will send to [facility]. Also will send vial of Humalog for him to [facility].

## 2016-10-01 NOTE — Telephone Encounter (Signed)
Patient would like a new script for [place] as he has found out that his insurance does cover.  [facility] [address] [place] [phone] [address]  Please call to discuss.  Thank you,  -LL

## 2016-10-08 ENCOUNTER — Telehealth: Payer: Self-pay | Admitting: Endocrinology

## 2016-10-08 NOTE — Telephone Encounter (Signed)
LM for pt to call back to schedule an appt in 3 wks

## 2016-10-08 NOTE — Telephone Encounter (Signed)
Todd Gaines from [facility] customer care calling in reference to patient. Patient needs to get back on Rx for  Insulin Disposable Pump (V-GO 30) KIT  This needs to be sent to [facility] fax # [phone]

## 2016-10-08 NOTE — Telephone Encounter (Signed)
Called VGo Customer Care and spoke with Bonita QuinLinda and let her know that his VGo Medical Necessity Letter was faxed to [facility] this morning and I have received the confirmation fax that it went through.

## 2016-10-14 ENCOUNTER — Other Ambulatory Visit: Payer: Self-pay

## 2016-10-14 MED ORDER — BASAGLAR KWIKPEN 100 UNIT/ML ~~LOC~~ SOPN: PEN_INJECTOR | SUBCUTANEOUS | 3 refills | Status: DC

## 2016-10-14 NOTE — Telephone Encounter (Signed)
According to [facility] they have still received no approval from the insurance company for the vgo. Please advise appeal will be needed--#[phone]

## 2016-10-14 NOTE — Telephone Encounter (Signed)
Called patient and let him know that I have sent this in for him.

## 2016-10-14 NOTE — Telephone Encounter (Signed)
Can you look into this one? Thank you! 

## 2016-10-14 NOTE — Telephone Encounter (Signed)
Pt is on toujeo and its not covered the alternate is basaglar

## 2016-10-16 ENCOUNTER — Other Ambulatory Visit: Payer: Self-pay

## 2016-10-20 NOTE — Telephone Encounter (Signed)
Spoke to [employer] insurance company and was told a peer to peer review with Dr. Lucianne MussKumar needed to be done to push this PA along- I set up peer to peer review for 10/21/16 at 12:40 pm with Dr. Lyda PeroneKirk

## 2016-10-20 NOTE — Telephone Encounter (Signed)
Spoke to the patient and he stated he tried to use the co-pay card but pharmacy said it would not go through and neither would his insurance- I assured the patient that I would call and see where they are with this process so that we can get it taken care of- patient verbalized an understanding- he will also try a new co-pay card when he gets back into town the card he used before was an old one

## 2016-10-21 NOTE — Telephone Encounter (Signed)
Peer-to-peer consultation was done and [employer] has refused the V-go pump as noncovered.  We will discuss the Omnipod insulin pump on his next visit

## 2016-10-27 NOTE — Telephone Encounter (Signed)
FYI-I called patient and left a voice message

## 2016-10-27 NOTE — Telephone Encounter (Signed)
Please inform patient

## 2016-10-27 NOTE — Telephone Encounter (Signed)
Called patient and left him a voice message to let him know about his VGo pump being denied by his insurance and that we will discuss the Omnipod insulin pump at his next visit.

## 2016-10-29 ENCOUNTER — Ambulatory Visit (INDEPENDENT_AMBULATORY_CARE_PROVIDER_SITE_OTHER): Payer: 59 | Admitting: Endocrinology

## 2016-10-29 ENCOUNTER — Encounter: Payer: Self-pay | Admitting: Endocrinology

## 2016-10-29 VITALS — BP 122/82 | HR 92 | Ht 73.0 in | Wt 283.0 lb

## 2016-10-29 DIAGNOSIS — Z794 Long term (current) use of insulin: Secondary | ICD-10-CM | POA: Diagnosis not present

## 2016-10-29 DIAGNOSIS — E1165 Type 2 diabetes mellitus with hyperglycemia: Secondary | ICD-10-CM

## 2016-10-29 LAB — GLUCOSE, POCT (MANUAL RESULT ENTRY): POC Glucose: 340 mg/dl — AB (ref 70–99)

## 2016-10-29 MED ORDER — VICTOZA 18 MG/3ML ~~LOC~~ SOPN: 1.2000 mg | PEN_INJECTOR | Freq: Every day | SUBCUTANEOUS | 3 refills | Status: DC

## 2016-10-29 NOTE — Progress Notes (Signed)
Patient ID: Todd Gaines, male   DOB: 1975-11-12, 41 y.o.   MRN: [id number]           Reason for Appointment: Follow-up for Type 2 Diabetes  Referring physician: [facility]  History of Present Illness:          Date of diagnosis of type 2 diabetes mellitus: 2012       Background history:   He believes her A1c was 12% when he was first diagnosed to have diabetes and was having symptoms of feeling very tired. Initially he made changes in his lifestyle and was able to lose some weight also He was probably treated with metformin and may have been given Tradjenta subsequently Previous records are not available He thinks his A1c had been around 7-7.5% for some time until last year He was started on basal insulin in 9/16 when his A1c was 9.9 He did not use the V-go pump because of lack of adequate insurance coverage and out-of-pocket expense, this was improving his control  Recent history:   INSULIN dosage: V-go 30 units pump with 3 clicks at meals Previous regimen: Basaglar 40 hs Humalog 8 units at breakfast and supper  Non-insulin hypoglycemic drugs the patient is taking are: Metformin ER 2000 mg a day, Tanzeum 50 mg weekly  His last A1c is 9.5 in June, previously 10.2 in 04/2016   Current blood sugar patterns and problems identified:   He has been using the V-go pump since about 2 days ago but he did not notify us that he was starting this  However he appears to know how to use it from previous instructions  The pump fell off last night and he did not attach it again until this morning and his blood sugar was 200 this morning at home  Also has not checked his blood sugars much since starting the pump  He thinks that previously with his other regimen his sugars were under 200 but not clear how often he was monitoring  He was also able to use the freestyle Libre sensor but again did not bring his meter for download and he misplaced this for 3 weeks until recently  Sometimes will  still have fast food including today at breakfast and blood sugar was over 300 postprandial  He does not take extra clicks for higher readings  He is somewhat more active late morning and early afternoon at his work  His weight fluctuates and is not able to lose weight consistently  Side effects from medications have been: Transient nausea from Trulicity  Compliance with the medical regimen: Fair    Glucose monitoring:  0-1 times a day         Glucometer:  freestyle Libre Blood Glucose readings not available  Self-care:  Typical meal intake: Breakfast is eggs, sausage sometimes, otherwise may have breakfast burrito Snacks will be peanut butter crackers, chips, pretzels and peanut butter               Dietician visit, most recent: 10/16               Exercise: active, walking at work  Weight history: 275-298 lbs  Wt Readings from Last 3 Encounters:  10/29/16 283 lb (128.4 kg)  09/21/16 279 lb 9.6 oz (126.8 kg)  06/10/16 284 lb (128.8 kg)    Glycemic control:   Lab Results  Component Value Date   HGBA1C 9.5 (H) 09/11/2016   HGBA1C 10.2 (H) 04/24/2016   HGBA1C 10.2 (H) 11/05/2015  Lab Results  Component Value Date   MICROALBUR 1.2 11/05/2015   LDLCALC 57 06/08/2016   CREATININE 0.94 09/11/2016    Office Visit on 10/29/2016  Component Date Value Ref Range Status  . POC Glucose 10/29/2016 340* 70 - 99 mg/dl Final      Allergies as of 10/29/2016   No Known Allergies     Medication List       Accurate as of 10/29/16 11:44 AM. Always use your most recent med list.          Albiglutide 50 MG Pen Commonly known as:  TANZEUM Inject 50 mg into the skin once a week.   atorvastatin 10 MG tablet Commonly known as:  LIPITOR Take 1 tablet (10 mg total) by mouth daily.   BASAGLAR KWIKPEN 100 UNIT/ML Sopn Give 40 units at supper   insulin lispro 100 UNIT/ML injection Commonly known as:  HUMALOG Inject 0.08 mLs (8 Units total) into the skin 3 (three) times  daily before meals.   insulin lispro 100 UNIT/ML injection Commonly known as:  HUMALOG Take 12-14 Humalog at supper and 8-10 at lunch   Insulin Pen Needle 32G X 5 MM Misc Commonly known as:  NOVOTWIST Use one per day to inject victoza   BD PEN NEEDLE NANO U/F 32G X 4 MM Misc Generic drug:  Insulin Pen Needle USE TO INJECT INSULIN ONCE A DAY   lisinopril 10 MG tablet Commonly known as:  PRINIVIL,ZESTRIL Take 1 tablet (10 mg total) by mouth daily.   metFORMIN 500 MG 24 hr tablet Commonly known as:  GLUCOPHAGE-XR TAKE FOUR TABLETS BY MOUTH DAILY   ONE TOUCH ULTRA 2 w/Device Kit Check fasting blood sugar and 2 hour post prandial BS in afternoon or evening. E11.9.   ONE TOUCH ULTRA TEST test strip Generic drug:  glucose blood CHECK FASTING BLOOD SUGAR AND 2 HOUR POST PRANDIAL IN AFTERNOON OR EVENING.   ONETOUCH DELICA LANCETS 21J Misc CHECK FASTING BLOOD SUGAR AND 2 HOUR POST PRANDIAL IN AFTERNOON OR EVENING.   STELARA [place] Inject into the skin.   V-GO 30 Kit Use one pump daily as directed   VICTOZA 18 MG/3ML Sopn Generic drug:  liraglutide Inject 0.2 mLs (1.2 mg total) into the skin daily. Inject once daily at the same time       Allergies: No Known Allergies  Past Medical History:  Diagnosis Date  . Diabetes mellitus without complication ([place])   . Elevated cholesterol   . Psoriasis     No past surgical history on file.  Family History  Problem Relation Age of Onset  . Diabetes Mother   . Diabetes Paternal Grandfather   . Hypertension Paternal Grandfather   . Heart disease Neg Hx   . Cancer Neg Hx     Social History:  reports that he has quit smoking. He has never used smokeless tobacco. He reports that he does not drink alcohol or use drugs.    Review of Systems    Lipid history: On treatment with Lipitor for Several years with adequate control Has low HDL   Lab Results  Component Value Date   CHOL 103 06/08/2016   HDL 24.80 (L) 06/08/2016    LDLCALC 57 06/08/2016   LDLDIRECT 77.0 11/05/2015   TRIG 105.0 06/08/2016   CHOLHDL 4 06/08/2016            Hypertension: Blood pressure is controlled with lisinopril prescribed by PCP  No retinopathy on his last exam in March  LABS:  Office Visit on 10/29/2016  Component Date Value Ref Range Status  . POC Glucose 10/29/2016 340* 70 - 99 mg/dl Final    Physical Examination:  BP 122/82   Pulse 92   Ht '6\' 1"'$  (1.854 m)   Wt 283 lb (128.4 kg)   SpO2 97%   BMI 37.34 kg/m        ASSESSMENT:  Diabetes type 2, uncontrolled with obesity See history of present illness for detailed discussion of his current management, blood sugar patterns and problems identified  He has just started using the V-go pump Discussed day-to-day management of this Also discussed need for consistent glucose monitoring and identifying of postprandial patterns Since fasting readings are high he probably needs either a higher basal rate or adding Victoza but his pump was just started 2 days ago  PLAN:    He will go back to Victoza 1.2 mg daily, Ozempic currently not covered.  He can take 0.6 the first couple of days  Alternative he has no difficulty with the co-pay with the 1.8 mg may need to increase it to potentially  Most likely will need to click 4 times for breakfast unless his blood sugars are running lower after breakfast but may be able to get 2 clicks when more active at lunch  He will do 4 clicks at dinnertime  Needs to do his mealtime clicks consistently before each meal  Low-fat diet  He will start using the freestyle Libre and also use the skin tac to help adherence  With this he should be able to monitor his postprandial readings better  He can bring this for download next week for review  If his fasting blood sugars stay high he will switch to the 40 unit basal, samples also given  Patient Instructions  Take extra click for > 342  4 clicks at dinner   Call in 10  days       Counseling time on subjects discussed in assessment and plan sections is over 50% of today's 25 minute visit   Safari Cinque 10/29/2016, 11:44 AM   Note: This office note was prepared with Dragon voice recognition system technology. Any transcriptional errors that result from this process are unintentional.

## 2016-10-29 NOTE — Patient Instructions (Signed)
Take extra click for > 200  4 clicks at dinner   Call in 10 days

## 2016-11-10 ENCOUNTER — Other Ambulatory Visit: Payer: Self-pay

## 2016-11-10 ENCOUNTER — Telehealth: Payer: Self-pay | Admitting: Endocrinology

## 2016-11-10 MED ORDER — INSULIN LISPRO 100 UNIT/ML ~~LOC~~ SOLN: SUBCUTANEOUS | 3 refills | Status: DC

## 2016-11-10 NOTE — Telephone Encounter (Signed)
MEDICATION: insulin lispro (HUMALOG) 100 UNIT/ML injection  PHARMACY:  [facility] [address] - [place], [place] - [address] [phone] (Phone) [phone] (Fax)       IS THIS A 90 DAY SUPPLY : Y  IS PATIENT OUT OF MEDICATION: Y  IF NOT; HOW MUCH IS LEFT:   LAST APPOINTMENT DATE: 10/29/16  NEXT APPOINTMENT DATE: 12/02/16  OTHER COMMENTS:    **Let patient know to contact pharmacy at the end of the day to make sure medication is ready. **  ** Please notify patient to allow 48-72 hours to process**  **Encourage patient to contact the pharmacy for refills or they can request refills through [facility]**

## 2016-11-10 NOTE — Telephone Encounter (Signed)
I have sent in a new prescription for his Humalog to the [facility] and called to let him know that I have adjusted for use with his insulin pump (VGo).

## 2016-11-27 ENCOUNTER — Other Ambulatory Visit: Payer: Self-pay

## 2016-12-02 ENCOUNTER — Ambulatory Visit: Payer: Self-pay | Admitting: Endocrinology

## 2016-12-04 ENCOUNTER — Other Ambulatory Visit: Payer: Self-pay

## 2016-12-07 DIAGNOSIS — L400 Psoriasis vulgaris: Secondary | ICD-10-CM | POA: Diagnosis not present

## 2016-12-10 DIAGNOSIS — J209 Acute bronchitis, unspecified: Secondary | ICD-10-CM | POA: Diagnosis not present

## 2016-12-11 ENCOUNTER — Ambulatory Visit: Payer: Self-pay | Admitting: Endocrinology

## 2017-01-08 ENCOUNTER — Ambulatory Visit: Payer: Self-pay | Admitting: Endocrinology

## 2017-02-12 ENCOUNTER — Other Ambulatory Visit: Payer: Self-pay

## 2017-02-13 ENCOUNTER — Other Ambulatory Visit: Payer: Self-pay | Admitting: Endocrinology

## 2017-02-16 ENCOUNTER — Other Ambulatory Visit (INDEPENDENT_AMBULATORY_CARE_PROVIDER_SITE_OTHER): Payer: 59

## 2017-02-16 ENCOUNTER — Ambulatory Visit: Payer: 59 | Admitting: Endocrinology

## 2017-02-16 ENCOUNTER — Telehealth: Payer: Self-pay | Admitting: Endocrinology

## 2017-02-16 ENCOUNTER — Other Ambulatory Visit: Payer: Self-pay

## 2017-02-16 DIAGNOSIS — E1165 Type 2 diabetes mellitus with hyperglycemia: Secondary | ICD-10-CM

## 2017-02-16 DIAGNOSIS — Z794 Long term (current) use of insulin: Secondary | ICD-10-CM

## 2017-02-16 LAB — BASIC METABOLIC PANEL
BUN: 10 mg/dL (ref 6–23)
CO2: 30 mEq/L (ref 19–32)
Calcium: 8.9 mg/dL (ref 8.4–10.5)
Chloride: 99 mEq/L (ref 96–112)
Creatinine, Ser: 0.76 mg/dL (ref 0.40–1.50)
GFR: 119.77 mL/min (ref >60.00)
Glucose, Bld: 220 mg/dL — ABNORMAL HIGH (ref 70–99)
Potassium: 3.8 mEq/L (ref 3.5–5.1)
Sodium: 135 mEq/L (ref 135–145)

## 2017-02-16 LAB — MICROALBUMIN / CREATININE URINE RATIO
Creatinine,U: 172.5 mg/dL
Microalb Creat Ratio: 2.5 mg/g (ref 0.0–30.0)
Microalb, Ur: 4.3 mg/dL — ABNORMAL HIGH (ref 0.0–1.9)

## 2017-02-16 LAB — HEMOGLOBIN A1C: Hgb A1c MFr Bld: 11.6 % — ABNORMAL HIGH (ref 4.6–6.5)

## 2017-02-16 MED ORDER — METFORMIN HCL ER 500 MG PO TB24: 2000.0000 mg | ORAL_TABLET | Freq: Every day | ORAL | 0 refills | Status: DC

## 2017-02-16 NOTE — Telephone Encounter (Signed)
Called patient and let him know that I have [facility].

## 2017-02-16 NOTE — Telephone Encounter (Signed)
Patients needs prescription for Metformin sent to [facility] [place] Patient is out of medication

## 2017-02-17 LAB — URINALYSIS, ROUTINE W REFLEX MICROSCOPIC
Bilirubin Urine: NEGATIVE
Hgb urine dipstick: NEGATIVE
Ketones, ur: NEGATIVE
Leukocytes, UA: NEGATIVE
Nitrite: NEGATIVE
RBC / HPF: NONE SEEN (ref 0–?)
Specific Gravity, Urine: >=1.03 — AB (ref 1.000–1.030)
Total Protein, Urine: NEGATIVE
Urine Glucose: 500 — AB
Urobilinogen, UA: 0.2 (ref 0.0–1.0)
pH: 6 (ref 5.0–8.0)

## 2017-02-18 ENCOUNTER — Encounter: Payer: Self-pay | Admitting: Endocrinology

## 2017-02-18 ENCOUNTER — Ambulatory Visit (INDEPENDENT_AMBULATORY_CARE_PROVIDER_SITE_OTHER): Payer: 59 | Admitting: Endocrinology

## 2017-02-18 VITALS — BP 126/86 | HR 83 | Ht 73.0 in | Wt 281.6 lb

## 2017-02-18 DIAGNOSIS — Z794 Long term (current) use of insulin: Secondary | ICD-10-CM | POA: Diagnosis not present

## 2017-02-18 DIAGNOSIS — E1165 Type 2 diabetes mellitus with hyperglycemia: Secondary | ICD-10-CM

## 2017-02-18 MED ORDER — FREESTYLE LIBRE 14 DAY SENSOR MISC: 1.0000 [IU] | 4 refills | Status: DC

## 2017-02-18 MED ORDER — FREESTYLE LIBRE 14 DAY READER DEVI: 1.0000 [IU] | Freq: Once | 0 refills | Status: DC

## 2017-02-18 NOTE — Progress Notes (Addendum)
Patient ID: Todd Gaines, male   DOB: [birth year], 41 y.o.   MRN: [id number]           Reason for Appointment: Follow-up for Type 2 Diabetes  Referring physician: [facility]  History of Present Illness:          Date of diagnosis of type 2 diabetes mellitus: 2012       Background history:   He believes her A1c was 12% when he was first diagnosed to have diabetes and was having symptoms of feeling very tired. Initially he made changes in his lifestyle and was able to lose some weight also He was probably treated with metformin and may have been given Tradjenta subsequently Previous records are not available He thinks his A1c had been around 7-7.5% for some time until last year He was started on basal insulin in 9/16 when his A1c was 9.9 He did not use the V-go pump because of lack of adequate insurance coverage and out-of-pocket expense, this was improving his control  Recent history:   INSULIN dosage: V-go 30 units pump with 4 clicks at meals Previous regimen: Basaglar 40 hs Humalog 8 units at breakfast and supper  Non-insulin hypoglycemic drugs the patient is taking are: Metformin ER 2000 mg a day, Victoza 1.5 mg daily  His A1c is much higher at 11.6, previously 9.5 in June   Current blood sugar patterns and problems identified:   He has been using the V-go pump since August  Since his blood sugars were relatively high he was told to try the 40 unit pump with the sample but even though his blood sugars were much better throughout the day with this he did not ask for change of prescription  He damaged his freestyle libre reader and has not had this for the last 10 days or so  Also his One Touch readings have been done only for a couple of days and he does not have any date for review  He thinks he is having post prandial readings fairly consistently especially at night  Blood sugar after lunch in the office was 220 and last night his glucose was 390 after eating  spaghetti  However even with his high blood sugars after meals he has not increased his boluses for his meals and takes about the same amount for all meals  He thinks his fasting blood sugars are variable but probably about 160.  He thinks he had much higher readings when he got steroids Dosepaks 3 times recently for poison ivy  No hypoglycemia reported during the day when he is somewhat more active  More recently he thinks he is trying to cut back on high-fat foods  His weight fluctuates and is not able to lose weight consistently  Side effects from medications have been: Transient nausea from Trulicity  Compliance with the medical regimen: Fair    Glucose monitoring:  0-1 times a day         Glucometer:  freestyle Libre Blood Glucose readings not available  By recall blood sugars as above  Self-care:  Typical meal intake: Breakfast is eggs, sausage sometimes, otherwise may have breakfast burrito  Snacks will be peanut butter crackers, chips, pretzels and peanut butter               Dietician visit, most recent: 10/16               Exercise: active, walking at work  Weight history: 275-298 lbs  Wt Readings from  Last 3 Encounters:  02/18/17 281 lb 9.6 oz (127.7 kg)  10/29/16 283 lb (128.4 kg)  09/21/16 279 lb 9.6 oz (126.8 kg)    Glycemic control:   Lab Results  Component Value Date   HGBA1C 11.6 (H) 02/16/2017   HGBA1C 9.5 (H) 09/11/2016   HGBA1C 10.2 (H) 04/24/2016   Lab Results  Component Value Date   MICROALBUR 4.3 (H) 02/16/2017   LDLCALC 57 06/08/2016   CREATININE 0.76 02/16/2017    Lab on 02/16/2017  Component Date Value Ref Range Status  . Hgb A1c MFr Bld 02/16/2017 11.6* 4.6 - 6.5 % Final   Glycemic Control Guidelines for People with Diabetes:Non Diabetic:  <6%Goal of Therapy: <7%Additional Action Suggested:  >8%   . Microalb, Ur 02/16/2017 4.3* 0.0 - 1.9 mg/dL Final  . Creatinine,U 02/16/2017 172.5  mg/dL Final  . Microalb Creat Ratio 02/16/2017  2.5  0.0 - 30.0 mg/g Final  . Sodium 02/16/2017 135  135 - 145 mEq/L Final  . Potassium 02/16/2017 3.8  3.5 - 5.1 mEq/L Final  . Chloride 02/16/2017 99  96 - 112 mEq/L Final  . CO2 02/16/2017 30  19 - 32 mEq/L Final  . Glucose, Bld 02/16/2017 220* 70 - 99 mg/dL Final  . BUN 02/16/2017 10  6 - 23 mg/dL Final  . Creatinine, Ser 02/16/2017 0.76  0.40 - 1.50 mg/dL Final  . Calcium 02/16/2017 8.9  8.4 - 10.5 mg/dL Final  . GFR 02/16/2017 119.77  >60.00 mL/min Final  . Color, Urine 02/16/2017 YELLOW  Yellow;Lt. Yellow Final  . APPearance 02/16/2017 CLEAR  Clear Final  . Specific Gravity, Urine 02/16/2017 >=1.030* 1.000 - 1.030 Final  . pH 02/16/2017 6.0  5.0 - 8.0 Final  . Total Protein, Urine 02/16/2017 NEGATIVE  Negative Final  . Urine Glucose 02/16/2017 500* Negative Final  . Ketones, ur 02/16/2017 NEGATIVE  Negative Final  . Bilirubin Urine 02/16/2017 NEGATIVE  Negative Final  . Hgb urine dipstick 02/16/2017 NEGATIVE  Negative Final  . Urobilinogen, UA 02/16/2017 0.2  0.0 - 1.0 Final  . Leukocytes, UA 02/16/2017 NEGATIVE  Negative Final  . Nitrite 02/16/2017 NEGATIVE  Negative Final  . WBC, UA 02/16/2017 0-2/hpf  0-2/hpf Final  . RBC / HPF 02/16/2017 none seen  0-2/hpf Final  . Ca Oxalate Crys, UA 02/16/2017 Presence of* None Final      Allergies as of 02/18/2017   No Known Allergies     Medication List        Accurate as of 02/18/17  9:04 PM. Always use your most recent med list.          Albiglutide 50 MG Pen Commonly known as:  TANZEUM Inject 50 mg into the skin once a week.   atorvastatin 10 MG tablet Commonly known as:  LIPITOR Take 1 tablet (10 mg total) by mouth daily.   FREESTYLE LIBRE 14 DAY READER Devi 1 Units by Does not apply route once for 1 dose.   FREESTYLE LIBRE 14 DAY SENSOR Misc 1 Units by Does not apply route every 14 (fourteen) days.   insulin lispro 100 UNIT/ML injection Commonly known as:  HUMALOG Inject 0.08 mLs (8 Units total) into the skin  3 (three) times daily before meals.   insulin lispro 100 UNIT/ML injection Commonly known as:  HUMALOG Use 56 units per day with VGo insulin pump   Insulin Pen Needle 32G X 5 MM Misc Commonly known as:  NOVOTWIST Use one per day to inject victoza  BD PEN NEEDLE NANO U/F 32G X 4 MM Misc Generic drug:  Insulin Pen Needle USE TO INJECT INSULIN ONCE A DAY   lisinopril 10 MG tablet Commonly known as:  PRINIVIL,ZESTRIL Take 1 tablet (10 mg total) by mouth daily.   metFORMIN 500 MG 24 hr tablet Commonly known as:  GLUCOPHAGE-XR Take 4 tablets (2,000 mg total) by mouth daily.   ONE TOUCH ULTRA 2 w/Device Kit Check fasting blood sugar and 2 hour post prandial BS in afternoon or evening. E11.9.   ONE TOUCH ULTRA TEST test strip Generic drug:  glucose blood CHECK FASTING BLOOD SUGAR AND 2 HOUR POST PRANDIAL IN AFTERNOON OR EVENING.   ONETOUCH DELICA LANCETS 25E Misc CHECK FASTING BLOOD SUGAR AND 2 HOUR POST PRANDIAL IN AFTERNOON OR EVENING.   STELARA Waterproof Inject into the skin.   V-GO 30 Kit Use one pump daily as directed   VICTOZA 18 MG/3ML Sopn Generic drug:  liraglutide Inject 0.2 mLs (1.2 mg total) into the skin daily. Inject once daily at the same time       Allergies: No Known Allergies  Past Medical History:  Diagnosis Date  . Diabetes mellitus without complication ([place])   . Elevated cholesterol   . Psoriasis     No past surgical history on file.  Family History  Problem Relation Age of Onset  . Diabetes Mother   . Diabetes Paternal Grandfather   . Hypertension Paternal Grandfather   . Heart disease Neg Hx   . Cancer Neg Hx     Social History:  reports that he has quit smoking. he has never used smokeless tobacco. He reports that he does not drink alcohol or use drugs.    Review of Systems    Lipid history: On treatment with Lipitor for Several years with adequate control Has low HDL   Lab Results  Component Value Date   CHOL 103 06/08/2016   HDL  24.80 (L) 06/08/2016   LDLCALC 57 06/08/2016   LDLDIRECT 77.0 11/05/2015   TRIG 105.0 06/08/2016   CHOLHDL 4 06/08/2016            Hypertension: Blood pressure is controlled with lisinopril prescribed by PCP  No retinopathy on his last exam in March   LABS:  Lab on 02/16/2017  Component Date Value Ref Range Status  . Hgb A1c MFr Bld 02/16/2017 11.6* 4.6 - 6.5 % Final   Glycemic Control Guidelines for People with Diabetes:Non Diabetic:  <6%Goal of Therapy: <7%Additional Action Suggested:  >8%   . Microalb, Ur 02/16/2017 4.3* 0.0 - 1.9 mg/dL Final  . Creatinine,U 02/16/2017 172.5  mg/dL Final  . Microalb Creat Ratio 02/16/2017 2.5  0.0 - 30.0 mg/g Final  . Sodium 02/16/2017 135  135 - 145 mEq/L Final  . Potassium 02/16/2017 3.8  3.5 - 5.1 mEq/L Final  . Chloride 02/16/2017 99  96 - 112 mEq/L Final  . CO2 02/16/2017 30  19 - 32 mEq/L Final  . Glucose, Bld 02/16/2017 220* 70 - 99 mg/dL Final  . BUN 02/16/2017 10  6 - 23 mg/dL Final  . Creatinine, Ser 02/16/2017 0.76  0.40 - 1.50 mg/dL Final  . Calcium 02/16/2017 8.9  8.4 - 10.5 mg/dL Final  . GFR 02/16/2017 119.77  >60.00 mL/min Final  . Color, Urine 02/16/2017 YELLOW  Yellow;Lt. Yellow Final  . APPearance 02/16/2017 CLEAR  Clear Final  . Specific Gravity, Urine 02/16/2017 >=1.030* 1.000 - 1.030 Final  . pH 02/16/2017 6.0  5.0 - 8.0  Final  . Total Protein, Urine 02/16/2017 NEGATIVE  Negative Final  . Urine Glucose 02/16/2017 500* Negative Final  . Ketones, ur 02/16/2017 NEGATIVE  Negative Final  . Bilirubin Urine 02/16/2017 NEGATIVE  Negative Final  . Hgb urine dipstick 02/16/2017 NEGATIVE  Negative Final  . Urobilinogen, UA 02/16/2017 0.2  0.0 - 1.0 Final  . Leukocytes, UA 02/16/2017 NEGATIVE  Negative Final  . Nitrite 02/16/2017 NEGATIVE  Negative Final  . WBC, UA 02/16/2017 0-2/hpf  0-2/hpf Final  . RBC / HPF 02/16/2017 none seen  0-2/hpf Final  . Ca Oxalate Crys, UA 02/16/2017 Presence of* None Final    Physical  Examination:  BP 126/86   Pulse 83   Ht _0  (1.854 m)   Wt 281 lb 9.6 oz (127.7 kg)   SpO2 98%   BMI 37.15 kg/m        ASSESSMENT:  Diabetes type 2, uncontrolled with obesity See history of present illness for detailed discussion of his current management, blood sugar patterns and problems identified  His blood sugars are quite out of control with A1c 11.6 Although he was recommended the 40 unit basal on the V-go pump and this was working for him he did not request a prescription change and is still using the 30 unit pump Also he is getting inadequate insulin for his meals currently with most likely postprandial readings over 200 consistently especially suppertime Currently does not have any blood sugar monitors to review today He may have had some worsening of his control because of taking steroids repeatedly over the last couple of months   PLAN:    He will go up on his bolus insulin and use 10-12 units at lunch and 12-14 at dinnertime  Needs to start checking blood sugars consistently again and sent prescription for the 14 day freestyle Libre sensor, discussed how this is used and he will continue to use the skin Tac to help with adhesion  Probably needs to control portions better  He was also explained the use of the Omnipod versus V-go pump which may allow him to have better flexibility, possibly have more mealtime insulin available and adjust basal rates for different times of the day  Continued GLP-1 drugs also and metformin, currently Ozempic may not be covered and he can continue Victoza  He will bring his freestyle Libre for review on the next visit  Discussed blood sugar targets of at least under 180 after meal and to continue to adjust his mealtime boluses to control this especially with higher carbohydrate meals  New prescription for 40 unit basal V-go pump to be sent also  Patient Instructions  Need 5-6 clicks at lunch and 6-7 at dinner      Counseling  time on subjects discussed in assessment and plan sections is over 50% of today's 25 minute visit   Todd Gaines 02/18/2017, 9:04 PM   Note: This office note was prepared with Dragon voice recognition system technology. Any transcriptional errors that result from this process are unintentional.

## 2017-02-18 NOTE — Patient Instructions (Addendum)
Need 5-6 clicks at lunch and 6-7 at dinner

## 2017-02-25 ENCOUNTER — Telehealth: Payer: Self-pay | Admitting: Endocrinology

## 2017-02-25 ENCOUNTER — Other Ambulatory Visit: Payer: Self-pay

## 2017-02-25 MED ORDER — V-GO 30 KIT: PACK | 1 refills | Status: DC

## 2017-02-25 NOTE — Telephone Encounter (Signed)
Called Todd Gaines with Omnipod and was not able to leave a message. Called Todd Gaines with [employer] and she stated that she will give Todd Gaines the message to call me back to give him the information to start the process for Omnipod information.  I have also sent in a new prescription for the VGo for patient.  Called patient and not able to leave a voice message to let him know about VGo and Omnipod.

## 2017-02-25 NOTE — Telephone Encounter (Signed)
Need refill of Insulin Disposable Pump (V-GO 30) KIT [[id number]]   need prescription for omni pod see what the cost is  Send to Pharmacy:  [place], [place]

## 2017-02-26 NOTE — Telephone Encounter (Signed)
I have given the information to FrederickBrett with Omn[employer]od so they can find out the cost and let Riki RuskJeremy know.

## 2017-02-27 ENCOUNTER — Other Ambulatory Visit: Payer: Self-pay | Admitting: Endocrinology

## 2017-03-05 ENCOUNTER — Other Ambulatory Visit: Payer: Self-pay

## 2017-03-05 MED ORDER — V-GO 30 KIT: PACK | 1 refills | Status: DC

## 2017-03-05 NOTE — Telephone Encounter (Signed)
I called the patient and

## 2017-03-05 NOTE — Telephone Encounter (Signed)
[facility] as we will have to set up training with Bonita QuinLinda and appointments with me

## 2017-03-05 NOTE — Telephone Encounter (Signed)
I called the patient and let him know to contact Genelle BalBrett with [employer] so that he can finalize the paperwork to receive his Omnipod supplies so he can get started on this pump. He stated he would.

## 2017-03-05 NOTE — Telephone Encounter (Signed)
Patient came by the office today and requested another rx for v-go 30 [facility]. The rx from 02/25/2017 was not received. I submitted another rx. Also, the patient advised me his insurance provided him with a quote regarding the omnipod and the estimated cost would be 50$. The patient wanted to know if [facility] could [facility]? Please advise, Thanks!

## 2017-03-11 ENCOUNTER — Encounter: Payer: Self-pay | Admitting: Endocrinology

## 2017-03-11 DIAGNOSIS — Z7689 Persons encountering health services in other specified circumstances: Secondary | ICD-10-CM | POA: Diagnosis not present

## 2017-04-07 ENCOUNTER — Ambulatory Visit (INDEPENDENT_AMBULATORY_CARE_PROVIDER_SITE_OTHER): Payer: 59 | Admitting: Endocrinology

## 2017-04-07 ENCOUNTER — Encounter: Payer: Self-pay | Admitting: Endocrinology

## 2017-04-07 VITALS — BP 140/82 | HR 78 | Ht 73.0 in | Wt 282.0 lb

## 2017-04-07 DIAGNOSIS — Z794 Long term (current) use of insulin: Secondary | ICD-10-CM

## 2017-04-07 DIAGNOSIS — E1165 Type 2 diabetes mellitus with hyperglycemia: Secondary | ICD-10-CM

## 2017-04-07 MED ORDER — V-GO 40 KIT: PACK | 3 refills | Status: DC

## 2017-04-07 NOTE — Patient Instructions (Signed)
Check on Ozempic

## 2017-04-07 NOTE — Progress Notes (Signed)
Patient ID: Todd Gaines, male   DOB: 04/06/1975, 42 y.o.   MRN: [id number]           Reason for Appointment: Follow-up for Type 2 Diabetes  Referring physician: [facility]  History of Present Illness:          Date of diagnosis of type 2 diabetes mellitus: 2012       Background history:   He believes her A1c was 12% when he was first diagnosed to have diabetes and was having symptoms of feeling very tired. Initially he made changes in his lifestyle and was able to lose some weight also He was probably treated with metformin and may have been given Tradjenta subsequently Previous records are not available He thinks his A1c had been around 7-7.5% for some time until last year He was started on basal insulin in 9/16 when his A1c was 9.9 He did not use the V-go pump because of lack of adequate insurance coverage and out-of-pocket expense, this was improving his control  Recent history:   INSULIN dosage: V-go 30 units pump with 4 clicks at meals   Non-insulin hypoglycemic drugs the patient is taking are: Metformin ER 2000 mg a day, Victoza 1.8 mg daily  His A1c is much higher at 11.6 in December, previously 9.5 in June   Current blood sugar patterns and problems identified:   He has not brought his freestyle Libre sensor for review  Although he was supposed to increase the basal rate to 40 units on his V-go pump he says he did not get to proper prescription and still using 30 units  However he says that after his last visit he started improving his diet and exercising regularly and is sugars were down to about 150 most of the time including fasting  However he now thinks that with stress and lack of exercise and inconsistent diet his blood sugars are mostly over 200 in the morning but slightly lower interest of the day when he checks them  His weight is about the same  Only once he felt hypoglycemic when he did not eat breakfast and exercised in the morning   Side effects from  medications have been: Transient nausea from Trulicity  Compliance with the medical regimen: Fair    Glucose monitoring:  0-1 times a day         Glucometer:  freestyle Libre Blood Glucose readings not available  By recall blood sugars as follows:    PRE-MEAL Fasting Lunch Dinner Bedtime Overall  Glucose range: 150-240  170 190   Mean/median:        Self-care:  Typical meal intake: Breakfast is eggs, sausage sometimes, otherwise may have breakfast burrito  Snacks will be peanut butter crackers, chips, pretzels and peanut butter               Dietician visit, most recent: 10/16               Exercise: active at work, occasionally exercise at the gym  Weight history: 275-298 lbs  Wt Readings from Last 3 Encounters:  04/07/17 282 lb (127.9 kg)  02/18/17 281 lb 9.6 oz (127.7 kg)  10/29/16 283 lb (128.4 kg)    Glycemic control:   Lab Results  Component Value Date   HGBA1C 11.6 (H) 02/16/2017   HGBA1C 9.5 (H) 09/11/2016   HGBA1C 10.2 (H) 04/24/2016   Lab Results  Component Value Date   MICROALBUR 4.3 (H) 02/16/2017   [place] 57 06/08/2016  CREATININE 0.76 02/16/2017    No visits with results within 1 Week(s) from this visit.  Latest known visit with results is:  Lab on 02/16/2017  Component Date Value Ref Range Status  . Hgb A1c MFr Bld 02/16/2017 11.6* 4.6 - 6.5 % Final   Glycemic Control Guidelines for People with Diabetes:Non Diabetic:  <6%Goal of Therapy: <7%Additional Action Suggested:  >8%   . Microalb, Ur 02/16/2017 4.3* 0.0 - 1.9 mg/dL Final  . Creatinine,U 02/16/2017 172.5  mg/dL Final  . Microalb Creat Ratio 02/16/2017 2.5  0.0 - 30.0 mg/g Final  . Sodium 02/16/2017 135  135 - 145 mEq/L Final  . Potassium 02/16/2017 3.8  3.5 - 5.1 mEq/L Final  . Chloride 02/16/2017 99  96 - 112 mEq/L Final  . CO2 02/16/2017 30  19 - 32 mEq/L Final  . Glucose, Bld 02/16/2017 220* 70 - 99 mg/dL Final  . BUN 02/16/2017 10  6 - 23 mg/dL Final  . Creatinine, Ser 02/16/2017  0.76  0.40 - 1.50 mg/dL Final  . Calcium 02/16/2017 8.9  8.4 - 10.5 mg/dL Final  . GFR 02/16/2017 119.77  >60.00 mL/min Final  . Color, Urine 02/16/2017 YELLOW  Yellow;Lt. Yellow Final  . APPearance 02/16/2017 CLEAR  Clear Final  . Specific Gravity, Urine 02/16/2017 >=1.030* 1.000 - 1.030 Final  . pH 02/16/2017 6.0  5.0 - 8.0 Final  . Total Protein, Urine 02/16/2017 NEGATIVE  Negative Final  . Urine Glucose 02/16/2017 500* Negative Final  . Ketones, ur 02/16/2017 NEGATIVE  Negative Final  . Bilirubin Urine 02/16/2017 NEGATIVE  Negative Final  . Hgb urine dipstick 02/16/2017 NEGATIVE  Negative Final  . Urobilinogen, UA 02/16/2017 0.2  0.0 - 1.0 Final  . Leukocytes, UA 02/16/2017 NEGATIVE  Negative Final  . Nitrite 02/16/2017 NEGATIVE  Negative Final  . WBC, UA 02/16/2017 0-2/hpf  0-2/hpf Final  . RBC / HPF 02/16/2017 none seen  0-2/hpf Final  . Ca Oxalate Crys, UA 02/16/2017 Presence of* None Final      Allergies as of 04/07/2017   No Known Allergies     Medication List        Accurate as of 04/07/17  4:47 PM. Always use your most recent med list.          Albiglutide 50 MG Pen Commonly known as:  TANZEUM Inject 50 mg into the skin once a week.   atorvastatin 10 MG tablet Commonly known as:  LIPITOR Take 1 tablet (10 mg total) by mouth daily.   FREESTYLE LIBRE 14 DAY READER Devi 1 Units by Does not apply route once for 1 dose.   FREESTYLE LIBRE 14 DAY SENSOR Misc 1 Units by Does not apply route every 14 (fourteen) days.   insulin lispro 100 UNIT/ML injection Commonly known as:  HUMALOG Inject 0.08 mLs (8 Units total) into the skin 3 (three) times daily before meals.   insulin lispro 100 UNIT/ML injection Commonly known as:  HUMALOG Use 56 units per day with VGo insulin pump   Insulin Pen Needle 32G X 5 MM Misc Commonly known as:  NOVOTWIST Use one per day to inject victoza   BD PEN NEEDLE NANO U/F 32G X 4 MM Misc Generic drug:  Insulin Pen Needle USE TO  INJECT INSULIN ONCE A DAY   lisinopril 10 MG tablet Commonly known as:  PRINIVIL,ZESTRIL Take 1 tablet (10 mg total) by mouth daily.   metFORMIN 500 MG 24 hr tablet Commonly known as:  GLUCOPHAGE-XR Take 4 tablets (  2,000 mg total) by mouth daily.   ONE TOUCH ULTRA 2 w/Device Kit Check fasting blood sugar and 2 hour post prandial BS in afternoon or evening. E11.9.   ONE TOUCH ULTRA TEST test strip Generic drug:  glucose blood CHECK FASTING BLOOD SUGAR AND 2 HOUR POST PRANDIAL IN AFTERNOON OR EVENING.   ONETOUCH DELICA LANCETS 08M Misc CHECK FASTING BLOOD SUGAR AND 2 HOUR POST PRANDIAL IN AFTERNOON OR EVENING.   STELARA [place] Inject into the skin.   V-GO 30 Kit Use one pump daily as directed   VICTOZA 18 MG/3ML Sopn Generic drug:  liraglutide Inject 0.2 mLs (1.2 mg total) into the skin daily. Inject once daily at the same time       Allergies: No Known Allergies  Past Medical History:  Diagnosis Date  . Diabetes mellitus without complication ([place])   . Elevated cholesterol   . Psoriasis     No past surgical history on file.  Family History  Problem Relation Age of Onset  . Diabetes Mother   . Diabetes Paternal Grandfather   . Hypertension Paternal Grandfather   . Heart disease Neg Hx   . Cancer Neg Hx     Social History:  reports that he has quit smoking. he has never used smokeless tobacco. He reports that he does not drink alcohol or use drugs.    Review of Systems    Lipid history: On treatment with Lipitor 10 mg for Several years with adequate control Has low HDL   Lab Results  Component Value Date   CHOL 103 06/08/2016   HDL 24.80 (L) 06/08/2016   LDLCALC 57 06/08/2016   LDLDIRECT 77.0 11/05/2015   TRIG 105.0 06/08/2016   CHOLHDL 4 06/08/2016            Hypertension: Blood pressure is controlled with lisinopril prescribed by PCP  No retinopathy on his last exam in March 2018   LABS:  No visits with results within 1 Week(s) from this visit.   Latest known visit with results is:  Lab on 02/16/2017  Component Date Value Ref Range Status  . Hgb A1c MFr Bld 02/16/2017 11.6* 4.6 - 6.5 % Final   Glycemic Control Guidelines for People with Diabetes:Non Diabetic:  <6%Goal of Therapy: <7%Additional Action Suggested:  >8%   . Microalb, Ur 02/16/2017 4.3* 0.0 - 1.9 mg/dL Final  . Creatinine,U 02/16/2017 172.5  mg/dL Final  . Microalb Creat Ratio 02/16/2017 2.5  0.0 - 30.0 mg/g Final  . Sodium 02/16/2017 135  135 - 145 mEq/L Final  . Potassium 02/16/2017 3.8  3.5 - 5.1 mEq/L Final  . Chloride 02/16/2017 99  96 - 112 mEq/L Final  . CO2 02/16/2017 30  19 - 32 mEq/L Final  . Glucose, Bld 02/16/2017 220* 70 - 99 mg/dL Final  . BUN 02/16/2017 10  6 - 23 mg/dL Final  . Creatinine, Ser 02/16/2017 0.76  0.40 - 1.50 mg/dL Final  . Calcium 02/16/2017 8.9  8.4 - 10.5 mg/dL Final  . GFR 02/16/2017 119.77  >60.00 mL/min Final  . Color, Urine 02/16/2017 YELLOW  Yellow;Lt. Yellow Final  . APPearance 02/16/2017 CLEAR  Clear Final  . Specific Gravity, Urine 02/16/2017 >=1.030* 1.000 - 1.030 Final  . pH 02/16/2017 6.0  5.0 - 8.0 Final  . Total Protein, Urine 02/16/2017 NEGATIVE  Negative Final  . Urine Glucose 02/16/2017 500* Negative Final  . Ketones, ur 02/16/2017 NEGATIVE  Negative Final  . Bilirubin Urine 02/16/2017 NEGATIVE  Negative Final  .  Hgb urine dipstick 02/16/2017 NEGATIVE  Negative Final  . Urobilinogen, UA 02/16/2017 0.2  0.0 - 1.0 Final  . Leukocytes, UA 02/16/2017 NEGATIVE  Negative Final  . Nitrite 02/16/2017 NEGATIVE  Negative Final  . WBC, UA 02/16/2017 0-2/hpf  0-2/hpf Final  . RBC / HPF 02/16/2017 none seen  0-2/hpf Final  . Ca Oxalate Crys, UA 02/16/2017 Presence of* None Final    Physical Examination:  BP 140/82   Pulse 78   Ht '6\' 1"'$  (1.854 m)   Wt 282 lb (127.9 kg)   SpO2 98%   BMI 37.21 kg/m        ASSESSMENT:  Diabetes type 2, uncontrolled with obesity See history of present illness for detailed discussion  of his current management, blood sugar patterns and problems identified  His blood sugars are not controlled as per his history although he did not bring his monitor Needs to increase his basal rate as fasting readings are apparently the highest of the day Currently he is not able to get adequate coverage for the Omnipod pump Also taking Victoza now    PLAN:    Increase basal on the pump  Continued GLP-1 drugs also and metformin, he will check if Ozempic may be covered   He will bring his freestyle Libre for review on the next visit  Discussed blood sugar targets of at least under 180 after meal and to continue to adjust his mealtime boluses to control this especially with higher carbohydrate meals  New prescription for 40 unit basal V-go pump has been sent today   There are no Patient Instructions on file for this visit.    Elayne Snare 04/07/2017, 4:47 PM   Note: This office note was prepared with Dragon voice recognition system technology. Any transcriptional errors that result from this process are unintentional.

## 2017-05-26 ENCOUNTER — Other Ambulatory Visit: Payer: Self-pay | Admitting: Family Medicine

## 2017-05-26 NOTE — Telephone Encounter (Signed)
No recent or future appts., please advise  

## 2017-05-27 NOTE — Telephone Encounter (Signed)
Please schedule f/u next mo and refill until then  

## 2017-05-28 NOTE — Telephone Encounter (Signed)
appt scheduled and med refilled 

## 2017-06-01 ENCOUNTER — Other Ambulatory Visit: Payer: Self-pay

## 2017-06-03 ENCOUNTER — Ambulatory Visit: Payer: Self-pay | Admitting: Endocrinology

## 2017-06-04 ENCOUNTER — Ambulatory Visit: Payer: Self-pay | Admitting: Endocrinology

## 2017-06-04 DIAGNOSIS — E119 Type 2 diabetes mellitus without complications: Secondary | ICD-10-CM | POA: Diagnosis not present

## 2017-06-04 DIAGNOSIS — E089 Diabetes mellitus due to underlying condition without complications: Secondary | ICD-10-CM | POA: Diagnosis not present

## 2017-06-07 LAB — HM DIABETES EYE EXAM

## 2017-06-09 LAB — HM DIABETES EYE EXAM

## 2017-06-23 ENCOUNTER — Encounter: Payer: Self-pay | Admitting: Family Medicine

## 2017-06-23 ENCOUNTER — Ambulatory Visit: Payer: 59 | Admitting: Family Medicine

## 2017-06-23 VITALS — BP 114/72 | HR 87 | Temp 98.3°F | Ht 73.0 in | Wt 273.0 lb

## 2017-06-23 DIAGNOSIS — I10 Essential (primary) hypertension: Secondary | ICD-10-CM

## 2017-06-23 DIAGNOSIS — E785 Hyperlipidemia, unspecified: Secondary | ICD-10-CM

## 2017-06-23 DIAGNOSIS — E1169 Type 2 diabetes mellitus with other specified complication: Secondary | ICD-10-CM | POA: Diagnosis not present

## 2017-06-23 DIAGNOSIS — Z114 Encounter for screening for human immunodeficiency virus [HIV]: Secondary | ICD-10-CM | POA: Diagnosis not present

## 2017-06-23 DIAGNOSIS — Z6839 Body mass index (BMI) 39.0-39.9, adult: Secondary | ICD-10-CM

## 2017-06-23 DIAGNOSIS — E1165 Type 2 diabetes mellitus with hyperglycemia: Secondary | ICD-10-CM | POA: Diagnosis not present

## 2017-06-23 MED ORDER — ATORVASTATIN CALCIUM 10 MG PO TABS: 10.0000 mg | ORAL_TABLET | Freq: Every day | ORAL | 3 refills | Status: DC

## 2017-06-23 MED ORDER — LISINOPRIL 10 MG PO TABS: 10.0000 mg | ORAL_TABLET | Freq: Every day | ORAL | 3 refills | Status: DC

## 2017-06-23 NOTE — Assessment & Plan Note (Signed)
Sees endocrinology  Lab Results  Component Value Date   HGBA1C 11.6 (H) 02/16/2017   F/u soon Issues with compliance (incl the V-go pump)- suggested setting cell phone alarms for reminders Disc diet  Eye exam recently w/o retinopathy  Nl foot exam  Disc self care  On ace for renal protection

## 2017-06-23 NOTE — Patient Instructions (Signed)
Labs today  Take care of yourself   Try hard to stick to a diabetic diet Set alarms on your phone to remind you about medicine also   Add 20 minutes walk daily when you can

## 2017-06-23 NOTE — Assessment & Plan Note (Signed)
bp in fair control at this time  BP Readings from Last 1 Encounters:  06/23/17 114/72   No changes needed Disc lifstyle change with low sodium diet and exercise  Doing well with lisinopril  Enc wt loss and more exercise

## 2017-06-23 NOTE — Progress Notes (Signed)
Subjective:    Patient ID: Todd Gaines, male    DOB: [birth year], 42 y.o.   MRN: [id number]  HPI Here for f/u of chronic health problems   Feeling ok overall  Daughter is playing ball- very busy   Wt Readings from Last 3 Encounters:  06/23/17 273 lb (123.8 kg)  04/07/17 282 lb (127.9 kg)  02/18/17 281 lb 9.6 oz (127.7 kg)  has tried to loose wt  More active - is out more and more active at job as well  36.02 kg/m   bp is stable today  No cp or palpitations or headaches or edema  No side effects to medicines  BP Readings from Last 3 Encounters:  06/23/17 114/72  04/07/17 140/82  02/18/17 126/86      Lab Results  Component Value Date   CREATININE 0.76 02/16/2017   BUN 10 02/16/2017   NA 135 02/16/2017   K 3.8 02/16/2017   CL 99 02/16/2017   CO2 30 02/16/2017   Lab Results  Component Value Date   ALT 9 06/08/2016   AST 11 06/08/2016   ALKPHOS 88 04/18/2014   BILITOT 0.4 04/18/2014      Diabetes Sees endocrinology  Lab Results  Component Value Date   HGBA1C 11.6 (H) 02/16/2017  has f/u later this month  More active - tried to eat better but schedule does not allow it  On several insulins  Compliance is hard with lifestyle (has V-go pump)-but forgets it   He just drank a mountain dew (unusual for him)  Most of the time diet   Eye exam-nl 2 weeks ago    Hyperlipidemia Lab Results  Component Value Date   CHOL 103 06/08/2016   CHOL 128 11/05/2015   CHOL 104 04/18/2014   Lab Results  Component Value Date   HDL 24.80 (L) 06/08/2016   HDL 25.40 (L) 11/05/2015   HDL 27.20 (L) 04/18/2014   Lab Results  Component Value Date   LDLCALC 57 06/08/2016   LDLCALC 42 04/18/2014   Lab Results  Component Value Date   TRIG 105.0 06/08/2016   TRIG 266.0 (H) 11/05/2015   TRIG 172.0 (H) 04/18/2014   Lab Results  Component Value Date   CHOLHDL 4 06/08/2016   CHOLHDL 5 11/05/2015   CHOLHDL 4 04/18/2014   Lab Results  Component Value Date   LDLDIRECT 77.0 11/05/2015   Perhaps the HDL will be better with more exercise   Patient Active Problem List   Diagnosis Date Noted  . Screening for HIV without presence of risk factors 06/23/2017  . Lack of concentration 04/08/2016  . Essential hypertension, benign 03/05/2015  . Obesity 04/19/2014  . Diabetes mellitus type 2, uncontrolled ([place]) 04/18/2014  . Hyperlipidemia associated with type 2 diabetes mellitus ([place]) 04/18/2014  . Psoriasis 04/18/2014   Past Medical History:  Diagnosis Date  . Diabetes mellitus without complication ([place])   . Elevated cholesterol   . Psoriasis    History reviewed. No pertinent surgical history. Social History   Tobacco Use  . Smoking status: Former Research scientist (life sciences)  . Smokeless tobacco: Never Used  Substance Use Topics  . Alcohol use: No    Alcohol/week: 0.0 oz  . Drug use: No   Family History  Problem Relation Age of Onset  . Diabetes Mother   . Diabetes Paternal Grandfather   . Hypertension Paternal Grandfather   . Heart disease Neg Hx   . Cancer Neg Hx    No Known Allergies Current  Outpatient Medications on File Prior to Visit  Medication Sig Dispense Refill  . BD PEN NEEDLE NANO U/F 32G X 4 MM MISC USE TO INJECT INSULIN ONCE A DAY 100 each 1  . Blood Glucose Monitoring Suppl (ONE TOUCH ULTRA 2) W/DEVICE KIT Check fasting blood sugar and 2 hour post prandial BS in afternoon or evening. E11.9. 1 each 0  . Continuous Blood Gluc Sensor (FREESTYLE LIBRE 14 DAY SENSOR) MISC 1 Units by Does not apply route every 14 (fourteen) days. 2 each 4  . Insulin Disposable Pump (V-GO 40) KIT Use 1x every 24 hrs 1 kit 3  . insulin lispro (HUMALOG) 100 UNIT/ML injection Inject 0.08 mLs (8 Units total) into the skin 3 (three) times daily before meals. 15 mL 3  . insulin lispro (HUMALOG) 100 UNIT/ML injection Use 56 units per day with VGo insulin pump 20 mL 3  . Insulin Pen Needle (NOVOTWIST) 32G X 5 MM MISC Use one per day to inject victoza 50 each 3  .  metFORMIN (GLUCOPHAGE-XR) 500 MG 24 hr tablet Take 4 tablets (2,000 mg total) by mouth daily. 120 tablet 0  . ONE TOUCH ULTRA TEST test strip CHECK FASTING BLOOD SUGAR AND 2 HOUR POST PRANDIAL IN AFTERNOON OR EVENING. 100 each 0  . ONETOUCH DELICA LANCETS 76L MISC CHECK FASTING BLOOD SUGAR AND 2 HOUR POST PRANDIAL IN AFTERNOON OR EVENING. 100 each 0  . Ustekinumab (STELARA [place]) Inject into the skin.    Marland Kitchen VICTOZA 18 MG/3ML SOPN Inject 0.2 mLs (1.2 mg total) into the skin daily. Inject once daily at the same time 2 pen 3  . Continuous Blood Gluc Receiver (FREESTYLE LIBRE 14 DAY READER) DEVI 1 Units by Does not apply route once for 1 dose. 1 Device 0   No current facility-administered medications on file prior to visit.      Review of Systems  Constitutional: Negative for activity change, appetite change, fatigue, fever and unexpected weight change.  HENT: Negative for congestion, rhinorrhea, sore throat and trouble swallowing.   Eyes: Negative for pain, redness, itching and visual disturbance.  Respiratory: Negative for cough, chest tightness, shortness of breath and wheezing.   Cardiovascular: Negative for chest pain and palpitations.  Gastrointestinal: Negative for abdominal pain, blood in stool, constipation, diarrhea and nausea.  Endocrine: Negative for cold intolerance, heat intolerance, polydipsia and polyuria.  Genitourinary: Negative for difficulty urinating, dysuria, frequency and urgency.  Musculoskeletal: Negative for arthralgias, joint swelling and myalgias.  Skin: Negative for pallor and rash.  Neurological: Negative for dizziness, tremors, weakness, numbness and headaches.  Hematological: Negative for adenopathy. Does not bruise/bleed easily.  Psychiatric/Behavioral: Negative for decreased concentration and dysphoric mood. The patient is not nervous/anxious.        Objective:   Physical Exam  Constitutional: He appears well-developed and well-nourished. No distress.  obese  and well appearing   HENT:  Head: Normocephalic and atraumatic.  Mouth/Throat: Oropharynx is clear and moist.  Eyes: Pupils are equal, round, and reactive to light. Conjunctivae and EOM are normal.  Neck: Normal range of motion. Neck supple. No JVD present. Carotid bruit is not present. No thyromegaly present.  Cardiovascular: Normal rate, regular rhythm, normal heart sounds and intact distal pulses. Exam reveals no gallop.  Pulmonary/Chest: Effort normal and breath sounds normal. No respiratory distress. He has no wheezes. He has no rales.  No crackles  Abdominal: Soft. Bowel sounds are normal. He exhibits no distension, no abdominal bruit and no mass. There is no tenderness.  Musculoskeletal: He exhibits no edema.  Lymphadenopathy:    He has no cervical adenopathy.  Neurological: He is alert. He has normal reflexes. No cranial nerve deficit. He exhibits normal muscle tone. Coordination normal.  Skin: Skin is warm and dry. No rash noted. No pallor.  Psychiatric: He has a normal mood and affect.          Assessment & Plan:   Problem List Items Addressed This Visit      Cardiovascular and Mediastinum   Essential hypertension, benign - Primary    bp in fair control at this time  BP Readings from Last 1 Encounters:  06/23/17 114/72   No changes needed Disc lifstyle change with low sodium diet and exercise  Doing well with lisinopril  Enc wt loss and more exercise       Relevant Medications   atorvastatin (LIPITOR) 10 MG tablet   lisinopril (PRINIVIL,ZESTRIL) 10 MG tablet   Other Relevant Orders   CBC with Differential/Platelet   Comprehensive metabolic panel   Lipid panel   TSH     Endocrine   Diabetes mellitus type 2, uncontrolled ([place])    Sees endocrinology  Lab Results  Component Value Date   HGBA1C 11.6 (H) 02/16/2017   F/u soon Issues with compliance (incl the V-go pump)- suggested setting cell phone alarms for reminders Disc diet  Eye exam recently w/o  retinopathy  Nl foot exam  Disc self care  On ace for renal protection       Relevant Medications   atorvastatin (LIPITOR) 10 MG tablet   lisinopril (PRINIVIL,ZESTRIL) 10 MG tablet   Hyperlipidemia associated with type 2 diabetes mellitus ([place])    Lab today  HDL generally low- enc exercise Disc goals for lipids and reasons to control them Rev labs with pt from draw a year ago Rev low sat fat diet in detail       Relevant Medications   atorvastatin (LIPITOR) 10 MG tablet   lisinopril (PRINIVIL,ZESTRIL) 10 MG tablet   Other Relevant Orders   Lipid panel     Other   Obesity    Discussed how this problem influences overall health and the risks it imposes  Reviewed plan for weight loss with lower calorie diet (via better food choices and also portion control or program like weight watchers) and exercise building up to or more than 30 minutes 5 days per week including some aerobic activity         Screening for HIV without presence of risk factors    HIV screen added to labs today       Relevant Orders   HIV antibody

## 2017-06-23 NOTE — Assessment & Plan Note (Signed)
HIV screen added to labs today

## 2017-06-23 NOTE — Assessment & Plan Note (Signed)
Lab today  HDL generally low- enc exercise Disc goals for lipids and reasons to control them Rev labs with pt from draw a year ago Rev low sat fat diet in detail

## 2017-06-23 NOTE — Assessment & Plan Note (Signed)
Discussed how this problem influences overall health and the risks it imposes  Reviewed plan for weight loss with lower calorie diet (via better food choices and also portion control or program like weight watchers) and exercise building up to or more than 30 minutes 5 days per week including some aerobic activity    

## 2017-06-24 LAB — CBC WITH DIFFERENTIAL/PLATELET
Basophils Absolute: 0.2 10*3/uL — ABNORMAL HIGH (ref 0.0–0.1)
Basophils Relative: 1.7 % (ref 0.0–3.0)
Eosinophils Absolute: 0.4 10*3/uL (ref 0.0–0.7)
Eosinophils Relative: 4 % (ref 0.0–5.0)
HCT: 43 % (ref 39.0–52.0)
Hemoglobin: 14.9 g/dL (ref 13.0–17.0)
Lymphocytes Relative: 25.3 % (ref 12.0–46.0)
Lymphs Abs: 2.4 10*3/uL (ref 0.7–4.0)
MCHC: 34.6 g/dL (ref 30.0–36.0)
MCV: 83.4 fl (ref 78.0–100.0)
Monocytes Absolute: 0.6 10*3/uL (ref 0.1–1.0)
Monocytes Relative: 5.9 % (ref 3.0–12.0)
Neutro Abs: 6 10*3/uL (ref 1.4–7.7)
Neutrophils Relative %: 63.1 % (ref 43.0–77.0)
Platelets: 253 10*3/uL (ref 150.0–400.0)
RBC: 5.15 Mil/uL (ref 4.22–5.81)
RDW: 13.4 % (ref 11.5–15.5)
WBC: 9.4 10*3/uL (ref 4.0–10.5)

## 2017-06-24 LAB — COMPREHENSIVE METABOLIC PANEL
ALT: 8 U/L (ref 0–53)
AST: 9 U/L (ref 0–37)
Albumin: 3.9 g/dL (ref 3.5–5.2)
Alkaline Phosphatase: 96 U/L (ref 39–117)
BUN: 13 mg/dL (ref 6–23)
CO2: 27 mEq/L (ref 19–32)
Calcium: 8.9 mg/dL (ref 8.4–10.5)
Chloride: 98 mEq/L (ref 96–112)
Creatinine, Ser: 1 mg/dL (ref 0.40–1.50)
GFR: 87.11 mL/min (ref >60.00)
Glucose, Bld: 401 mg/dL — ABNORMAL HIGH (ref 70–99)
Potassium: 4.2 mEq/L (ref 3.5–5.1)
Sodium: 133 mEq/L — ABNORMAL LOW (ref 135–145)
Total Bilirubin: 0.4 mg/dL (ref 0.2–1.2)
Total Protein: 6.8 g/dL (ref 6.0–8.3)

## 2017-06-24 LAB — LIPID PANEL
Cholesterol: 138 mg/dL (ref 0–200)
HDL: 25.6 mg/dL — ABNORMAL LOW (ref >39.00)
NonHDL: 112.05
Total CHOL/HDL Ratio: 5
Triglycerides: 261 mg/dL — ABNORMAL HIGH (ref 0.0–149.0)
VLDL: 52.2 mg/dL — ABNORMAL HIGH (ref 0.0–40.0)

## 2017-06-24 LAB — TSH: TSH: 1.54 u[IU]/mL (ref 0.35–4.50)

## 2017-06-24 LAB — LDL CHOLESTEROL, DIRECT: Direct LDL: 89 mg/dL

## 2017-06-24 LAB — HIV ANTIBODY (ROUTINE TESTING W REFLEX): HIV 1&2 Ab, 4th Generation: NONREACTIVE

## 2017-06-25 ENCOUNTER — Encounter: Payer: Self-pay | Admitting: *Deleted

## 2017-11-23 ENCOUNTER — Other Ambulatory Visit: Payer: Self-pay | Admitting: Endocrinology

## 2017-11-26 ENCOUNTER — Other Ambulatory Visit: Payer: Self-pay | Admitting: Endocrinology

## 2017-12-03 ENCOUNTER — Other Ambulatory Visit (INDEPENDENT_AMBULATORY_CARE_PROVIDER_SITE_OTHER): Payer: 59

## 2017-12-03 DIAGNOSIS — E1165 Type 2 diabetes mellitus with hyperglycemia: Secondary | ICD-10-CM | POA: Diagnosis not present

## 2017-12-03 DIAGNOSIS — Z794 Long term (current) use of insulin: Secondary | ICD-10-CM

## 2017-12-03 LAB — BASIC METABOLIC PANEL
BUN: 12 mg/dL (ref 6–23)
CO2: 29 mEq/L (ref 19–32)
Calcium: 9.2 mg/dL (ref 8.4–10.5)
Chloride: 101 mEq/L (ref 96–112)
Creatinine, Ser: 0.92 mg/dL (ref 0.40–1.50)
GFR: 95.7 mL/min (ref >60.00)
Glucose, Bld: 251 mg/dL — ABNORMAL HIGH (ref 70–99)
Potassium: 5.2 mEq/L — ABNORMAL HIGH (ref 3.5–5.1)
Sodium: 137 mEq/L (ref 135–145)

## 2017-12-03 LAB — LIPID PANEL
Cholesterol: 117 mg/dL (ref 0–200)
HDL: 27.6 mg/dL — ABNORMAL LOW (ref >39.00)
LDL Cholesterol: 66 mg/dL (ref 0–99)
NonHDL: 89.63
Total CHOL/HDL Ratio: 4
Triglycerides: 119 mg/dL (ref 0.0–149.0)
VLDL: 23.8 mg/dL (ref 0.0–40.0)

## 2017-12-03 LAB — HEMOGLOBIN A1C: Hgb A1c MFr Bld: 12.2 % — ABNORMAL HIGH (ref 4.6–6.5)

## 2017-12-05 NOTE — Progress Notes (Signed)
Patient ID: Todd Gaines, male   DOB: Dec 02, 1975, 42 y.o.   MRN: [id number]           Reason for Appointment: Follow-up for Type 2 Diabetes  Referring physician: [facility]  History of Present Illness:          Date of diagnosis of type 2 diabetes mellitus: 2012       Background history:   He believes her A1c was 12% when he was first diagnosed to have diabetes and was having symptoms of feeling very tired. Initially he made changes in his lifestyle and was able to lose some weight also He was probably treated with metformin and may have been given Tradjenta subsequently Previous records are not available He thinks his A1c had been around 7-7.5% for some time until last year He was started on basal insulin in 9/16 when his A1c was 9.9 He did not use the V-go pump because of lack of adequate insurance coverage and out-of-pocket expense, this was improving his control  Recent history:   INSULIN dosage: Basaglar 40 units daily in the evening  Non-insulin hypoglycemic drugs the patient is taking are: Metformin ER 2000 mg a day, Victoza 1.8 mg daily  His A1c is much higher at 12.2, previously was 11.6 in December 2018  His last visit was in 1/19  Current blood sugar patterns and problems identified:   He has not used his V-go pump for about 3 weeks because of lack of insurance coverage  However still not clear why his blood sugars from his A1c are markedly increased  He thinks that his poor control is related to noncompliance with diet as far as planning meals and drinking sweet drinks  He is using some Basaglar on his own but no mealtime insulin  Today without any mealtime insulin his blood sugar is well over 300 after lunch even though he did not have a significantly high fat high carbohydrate lunch or sweet drink  Also checking blood sugars very sporadically and did not bring any meter or record  And thinks his fasting blood sugars are probably about 200  He says he cannot  do the freestyle libre because it falls off  However he appears to be losing weight   Side effects from medications have been: Transient nausea from Trulicity  Compliance with the medical regimen: Fair    Glucose monitoring:  0-1 times a day         Glucometer:  freestyle Libre Blood Glucose readings not available  Self-care:  Typical meal intake: Breakfast is eggs, sausage sometimes, otherwise may have breakfast burrito  Snacks will be peanut butter crackers, chips, pretzels and peanut butter               Dietician visit, most recent: 10/16               Exercise: active at work   Weight history: 275-298 lbs  Wt Readings from Last 3 Encounters:  12/06/17 266 lb (120.7 kg)  06/23/17 273 lb (123.8 kg)  04/07/17 282 lb (127.9 kg)    Glycemic control:   Lab Results  Component Value Date   HGBA1C 12.2 (H) 12/03/2017   HGBA1C 11.6 (H) 02/16/2017   HGBA1C 9.5 (H) 09/11/2016   Lab Results  Component Value Date   MICROALBUR 4.3 (H) 02/16/2017   [place] 66 12/03/2017   CREATININE 0.92 12/03/2017    Office Visit on 12/06/2017  Component Date Value Ref Range Status  . POC Glucose  12/06/2017 384* 70 - 99 mg/dl Final  Lab on 12/03/2017  Component Date Value Ref Range Status  . Cholesterol 12/03/2017 117  0 - 200 mg/dL Final   ATP III Classification       Desirable:  < 200 mg/dL               Borderline High:  200 - 239 mg/dL          High:  > = 240 mg/dL  . Triglycerides 12/03/2017 119.0  0.0 - 149.0 mg/dL Final   Normal:  <150 mg/dLBorderline High:  150 - 199 mg/dL  . HDL 12/03/2017 27.60* >39.00 mg/dL Final  . VLDL 12/03/2017 23.8  0.0 - 40.0 mg/dL Final  . LDL Cholesterol 12/03/2017 66  0 - 99 mg/dL Final  . Total CHOL/HDL Ratio 12/03/2017 4   Final                  Men          Women1/2 Average Risk     3.4          3.3Average Risk          5.0          4.42X Average Risk          9.6          7.13X Average Risk          15.0          11.0                      .  NonHDL 12/03/2017 89.63   Final   NOTE:  Non-HDL goal should be 30 mg/dL higher than patient's LDL goal (i.e. LDL goal of < 70 mg/dL, would have non-HDL goal of < 100 mg/dL)  . Sodium 12/03/2017 137  135 - 145 mEq/L Final  . Potassium 12/03/2017 5.2* 3.5 - 5.1 mEq/L Final  . Chloride 12/03/2017 101  96 - 112 mEq/L Final  . CO2 12/03/2017 29  19 - 32 mEq/L Final  . Glucose, Bld 12/03/2017 251* 70 - 99 mg/dL Final  . BUN 12/03/2017 12  6 - 23 mg/dL Final  . Creatinine, Ser 12/03/2017 0.92  0.40 - 1.50 mg/dL Final  . Calcium 12/03/2017 9.2  8.4 - 10.5 mg/dL Final  . GFR 12/03/2017 95.70  >60.00 mL/min Final  . Hgb A1c MFr Bld 12/03/2017 12.2* 4.6 - 6.5 % Final   Glycemic Control Guidelines for People with Diabetes:Non Diabetic:  <6%Goal of Therapy: <7%Additional Action Suggested:  >8%       Allergies as of 12/06/2017   No Known Allergies     Medication List        Accurate as of 12/06/17 11:59 PM. Always use your most recent med list.          atorvastatin 10 MG tablet Commonly known as:  LIPITOR Take 1 tablet (10 mg total) by mouth daily.   FREESTYLE LIBRE 14 DAY SENSOR Misc 1 Units by Does not apply route every 14 (fourteen) days.   insulin lispro 100 UNIT/ML KiwkPen Commonly known as:  HUMALOG 15-20 ac tid   Insulin Pen Needle 32G X 5 MM Misc Use one per day to inject victoza   BD PEN NEEDLE NANO U/F 32G X 4 MM Misc Generic drug:  Insulin Pen Needle USE TO INJECT INSULIN ONCE A DAY   lisinopril 10 MG tablet Commonly known as:  PRINIVIL,ZESTRIL Take 1  tablet (10 mg total) by mouth daily.   metFORMIN 500 MG 24 hr tablet Commonly known as:  GLUCOPHAGE-XR Take 4 tablets (2,000 mg total) by mouth daily.   ONE TOUCH ULTRA 2 w/Device Kit Check fasting blood sugar and 2 hour post prandial BS in afternoon or evening. E11.9.   ONE TOUCH ULTRA TEST test strip Generic drug:  glucose blood CHECK FASTING BLOOD SUGAR AND 2 HOUR POST PRANDIAL IN AFTERNOON OR EVENING.     ONETOUCH DELICA LANCETS 35T Misc CHECK FASTING BLOOD SUGAR AND 2 HOUR POST PRANDIAL IN AFTERNOON OR EVENING.   STELARA [place] Inject into the skin.   V-GO 40 Kit Use 1x every 24 hrs   VICTOZA 18 MG/3ML Sopn Generic drug:  liraglutide Inject 0.2 mLs (1.2 mg total) into the skin daily. Inject once daily at the same time       Allergies: No Known Allergies  Past Medical History:  Diagnosis Date  . Diabetes mellitus without complication ([place])   . Elevated cholesterol   . Psoriasis     No past surgical history on file.  Family History  Problem Relation Age of Onset  . Diabetes Mother   . Diabetes Paternal Grandfather   . Hypertension Paternal Grandfather   . Heart disease Neg Hx   . Cancer Neg Hx     Social History:  reports that he has quit smoking. He has never used smokeless tobacco. He reports that he does not drink alcohol or use drugs.    Review of Systems    Lipid history: On treatment with Lipitor 10 mg for Several years with adequate control Has persistently low HDL   Lab Results  Component Value Date   CHOL 117 12/03/2017   HDL 27.60 (L) 12/03/2017   LDLCALC 66 12/03/2017   LDLDIRECT 89.0 06/23/2017   TRIG 119.0 12/03/2017   CHOLHDL 4 12/03/2017            Hypertension: Blood pressure is controlled with lisinopril prescribed by PCP However potassium is high normal Currently not consuming excess amounts of high potassium foods except [place]es earlier this summer  No retinopathy on his last exam in March 2018   LABS:  Office Visit on 12/06/2017  Component Date Value Ref Range Status  . POC Glucose 12/06/2017 384* 70 - 99 mg/dl Final  Lab on 12/03/2017  Component Date Value Ref Range Status  . Cholesterol 12/03/2017 117  0 - 200 mg/dL Final   ATP III Classification       Desirable:  < 200 mg/dL               Borderline High:  200 - 239 mg/dL          High:  > = 240 mg/dL  . Triglycerides 12/03/2017 119.0  0.0 - 149.0 mg/dL Final   Normal:   <150 mg/dLBorderline High:  150 - 199 mg/dL  . HDL 12/03/2017 27.60* >39.00 mg/dL Final  . VLDL 12/03/2017 23.8  0.0 - 40.0 mg/dL Final  . LDL Cholesterol 12/03/2017 66  0 - 99 mg/dL Final  . Total CHOL/HDL Ratio 12/03/2017 4   Final                  Men          Women1/2 Average Risk     3.4          3.3Average Risk          5.0  4.42X Average Risk          9.6          7.13X Average Risk          15.0          11.0                      . NonHDL 12/03/2017 89.63   Final   NOTE:  Non-HDL goal should be 30 mg/dL higher than patient's LDL goal (i.e. LDL goal of < 70 mg/dL, would have non-HDL goal of < 100 mg/dL)  . Sodium 12/03/2017 137  135 - 145 mEq/L Final  . Potassium 12/03/2017 5.2* 3.5 - 5.1 mEq/L Final  . Chloride 12/03/2017 101  96 - 112 mEq/L Final  . CO2 12/03/2017 29  19 - 32 mEq/L Final  . Glucose, Bld 12/03/2017 251* 70 - 99 mg/dL Final  . BUN 12/03/2017 12  6 - 23 mg/dL Final  . Creatinine, Ser 12/03/2017 0.92  0.40 - 1.50 mg/dL Final  . Calcium 12/03/2017 9.2  8.4 - 10.5 mg/dL Final  . GFR 12/03/2017 95.70  >60.00 mL/min Final  . Hgb A1c MFr Bld 12/03/2017 12.2* 4.6 - 6.5 % Final   Glycemic Control Guidelines for People with Diabetes:Non Diabetic:  <6%Goal of Therapy: <7%Additional Action Suggested:  >8%     Physical Examination:  BP 128/86   Pulse 93   Ht 6' 0.5" (1.842 m)   Wt 266 lb (120.7 kg)   SpO2 96%   BMI 35.58 kg/m        ASSESSMENT:  Diabetes type 2, uncontrolled with obesity  See history of present illness for detailed discussion of his current management, blood sugar patterns and problems identified  His most recent A1c levels have been around 11-12  This is from getting inadequate insulin and lack of information about his blood sugar patterns and insulin needs He has been very irregular with his follow-up, glucose monitoring Also for planning of meals and excessive simple sugar intake is also affecting his level of control overall He may  well be needing increasing amounts of insulin as his control was not improved with the V-go pump probably because of inadequate mealtime coverage This is also despite taking Victoza  Hyperkalemia: This is probably from a combination of poor diabetes control and continuing lisinopril  Influenza vaccine given  PLAN:     Since he cannot afford the V-go pump that is not covered by his insurance will need to do basal bolus insulin regimen with addition of Humalog to his Basaglar  He also needs to start checking his blood sugars very consistently including after meals and be able to bring a record of his blood sugars or his meter  Increase basal insulin with taking 50 units of Basaglar  Given a flowsheet to help him adjust to [place] at home on his own by 2 units every 3 days until blood sugars are consistently under 130  We will check the coverage for [place] or [place] for more consistent 24 blood sugar control  He will increase the Victoza to 1.8 mg  He needs to start checking his sugars regularly either with fingersticks or with the freestyle libre  Discussed options of keeping the freestyle libre sensor on his arm preferably with an arm band that he can purchase from [employer]  Although he would benefit from a diabetes educator or nutritional advice with a dietitian he has difficulty with  insurance coverage and cost  He needs more regular follow-up to help finalize his insulin regimen  For his HUMALOG insulin he will take 15 units of average meals and 20 units for larger meals  With monitoring postprandial readings he will need to make adjustments to keep his postprandial readings under 180 at least Recheck potassium on next visit  Patient Instructions  Basaglar 50  Arm band  Victoza 1.8  Check on [place] or [place]  Check blood sugars on waking up    Also check blood sugars about 2 hours after a meal and do this after different meals by  rotation  Recommended blood sugar levels on waking up is 90-130 and about 2 hours after meal is 130-160  Please bring your blood sugar monitor to each visit, thank you     Counseling time on subjects discussed in assessment and plan sections is over 50% of today's 25 minute visit  Elayne Snare 12/07/2017, 3:21 PM   Note: This office note was prepared with Dragon voice recognition system technology. Any transcriptional errors that result from this process are unintentional.

## 2017-12-06 ENCOUNTER — Encounter: Payer: Self-pay | Admitting: Endocrinology

## 2017-12-06 ENCOUNTER — Ambulatory Visit (INDEPENDENT_AMBULATORY_CARE_PROVIDER_SITE_OTHER): Payer: 59 | Admitting: Endocrinology

## 2017-12-06 VITALS — BP 128/86 | HR 93 | Ht 72.5 in | Wt 266.0 lb

## 2017-12-06 DIAGNOSIS — Z794 Long term (current) use of insulin: Secondary | ICD-10-CM

## 2017-12-06 DIAGNOSIS — E782 Mixed hyperlipidemia: Secondary | ICD-10-CM

## 2017-12-06 DIAGNOSIS — E875 Hyperkalemia: Secondary | ICD-10-CM

## 2017-12-06 DIAGNOSIS — Z23 Encounter for immunization: Secondary | ICD-10-CM

## 2017-12-06 DIAGNOSIS — E1165 Type 2 diabetes mellitus with hyperglycemia: Secondary | ICD-10-CM

## 2017-12-06 LAB — GLUCOSE, POCT (MANUAL RESULT ENTRY): POC Glucose: 384 mg/dl — AB (ref 70–99)

## 2017-12-06 MED ORDER — VICTOZA 18 MG/3ML ~~LOC~~ SOPN: 1.2000 mg | PEN_INJECTOR | Freq: Every day | SUBCUTANEOUS | 3 refills | Status: DC

## 2017-12-06 MED ORDER — METFORMIN HCL ER 500 MG PO TB24: 2000.0000 mg | ORAL_TABLET | Freq: Every day | ORAL | 0 refills | Status: DC

## 2017-12-06 MED ORDER — INSULIN LISPRO 100 UNIT/ML (KWIKPEN): PEN_INJECTOR | SUBCUTANEOUS | 1 refills | Status: DC

## 2017-12-06 NOTE — Patient Instructions (Addendum)
Basaglar 50  Arm band  Victoza 1.8  Check on [place] or [place]  Check blood sugars on waking up    Also check blood sugars about 2 hours after a meal and do this after different meals by rotation  Recommended blood sugar levels on waking up is 90-130 and about 2 hours after meal is 130-160  Please bring your blood sugar monitor to each visit, thank you

## 2017-12-13 ENCOUNTER — Other Ambulatory Visit: Payer: Self-pay | Admitting: Family Medicine

## 2017-12-13 NOTE — Telephone Encounter (Signed)
Routing to Endo doc that manages his DM

## 2017-12-15 ENCOUNTER — Other Ambulatory Visit: Payer: Self-pay | Admitting: Family Medicine

## 2017-12-16 NOTE — Telephone Encounter (Signed)
Routing to endo doc who manages pt's DM

## 2017-12-16 NOTE — Telephone Encounter (Signed)
Routing to Endo doc who manages pt's DM

## 2017-12-18 MED ORDER — GLUCOSE BLOOD VI STRP: ORAL_STRIP | 0 refills | Status: DC

## 2017-12-18 MED ORDER — ONETOUCH DELICA LANCETS 33G MISC: 1 refills | Status: DC

## 2018-01-26 ENCOUNTER — Ambulatory Visit (INDEPENDENT_AMBULATORY_CARE_PROVIDER_SITE_OTHER): Payer: 59 | Admitting: Family Medicine

## 2018-01-26 ENCOUNTER — Encounter: Payer: Self-pay | Admitting: Family Medicine

## 2018-01-26 VITALS — BP 116/70 | HR 84 | Temp 98.3°F | Ht 72.5 in | Wt 271.5 lb

## 2018-01-26 DIAGNOSIS — E1165 Type 2 diabetes mellitus with hyperglycemia: Secondary | ICD-10-CM | POA: Diagnosis not present

## 2018-01-26 DIAGNOSIS — Z0000 Encounter for general adult medical examination without abnormal findings: Secondary | ICD-10-CM | POA: Diagnosis not present

## 2018-01-26 DIAGNOSIS — Z6836 Body mass index (BMI) 36.0-36.9, adult: Secondary | ICD-10-CM

## 2018-01-26 DIAGNOSIS — E1169 Type 2 diabetes mellitus with other specified complication: Secondary | ICD-10-CM | POA: Diagnosis not present

## 2018-01-26 DIAGNOSIS — I10 Essential (primary) hypertension: Secondary | ICD-10-CM | POA: Diagnosis not present

## 2018-01-26 DIAGNOSIS — E785 Hyperlipidemia, unspecified: Secondary | ICD-10-CM

## 2018-01-26 MED ORDER — LISINOPRIL 10 MG PO TABS: 10.0000 mg | ORAL_TABLET | Freq: Every day | ORAL | 3 refills | Status: DC

## 2018-01-26 MED ORDER — ATORVASTATIN CALCIUM 10 MG PO TABS: 10.0000 mg | ORAL_TABLET | Freq: Every day | ORAL | 3 refills | Status: DC

## 2018-01-26 NOTE — Assessment & Plan Note (Signed)
Seeing endocrinology and recently back on medication  Lab Results  Component Value Date   HGBA1C 12.2 (H) 12/03/2017   Strongly enc to start exercise and work towards a DM diet  Also continue to work on wt loss  Eye exam utd  Foot care discussed  Ace for renal protection

## 2018-01-26 NOTE — Assessment & Plan Note (Signed)
Disc goals for lipids and reasons to control them Rev last labs with pt Rev low sat fat diet in detail HDL low-disc exercise/fish and fish oil consumption to help

## 2018-01-26 NOTE — Progress Notes (Signed)
Subjective:    Patient ID: Todd Gaines, male    DOB: 05-21-1975, 42 y.o.   MRN: [id number]  HPI Here for health maintenance exam and to review chronic medical problems    Working  Chasing daughter around ball field a lot  Changed jobs- more physical than it used to be   [employer] [facility] Readings from Last 3 Encounters:  01/26/18 271 lb 8 oz (123.2 kg)  12/06/17 266 lb (120.7 kg)  06/23/17 273 lb (123.8 kg)  likes to play golf  Active job  Yard work  Looking into a home gym  Eating fair  36.32 kg/m   Tetanus shot 1/18  PNA vaccine 7/16  HIV screen 4/19   Flu shot 9/19   Prostate health  None he knows of  Nocturia occasionally (if he drinks a lot of fluid in the evenings)  Empties fine    Family hx  No prostate cancer  No colon cancer  Mother with DM PGF had DM   bp is stable today  No cp or palpitations or headaches or edema  No side effects to medicines  BP Readings from Last 3 Encounters:  01/26/18 116/70  12/06/17 128/86  06/23/17 114/72    Lisinopril   DM2 Sees Dr Dwyane Dee Lab Results  Component Value Date   HGBA1C 12.2 (H) 12/03/2017  did not take his medicine last time  Back on track with medication  110-180 usual range  Varies with what he eats in the evening  Avoids rice and bread    Hyperlipidemia  Lab Results  Component Value Date   CHOL 117 12/03/2017   HDL 27.60 (L) 12/03/2017   LDLCALC 66 12/03/2017   LDLDIRECT 89.0 06/23/2017   TRIG 119.0 12/03/2017   CHOLHDL 4 12/03/2017   Due for labs  Taking statin -lipitor 10 mg   Is good about sat and trans fats in diet  Not much red meat  Not much fried food   Not much exercise overall   Patient Active Problem List   Diagnosis Date Noted  . Routine general medical examination at a health care facility 01/26/2018  . Screening for HIV without presence of risk factors 06/23/2017  . Lack of concentration 04/08/2016  . Essential hypertension, benign 03/05/2015  . Obesity 04/19/2014  .  Diabetes mellitus type 2, uncontrolled ([place]) 04/18/2014  . Hyperlipidemia associated with type 2 diabetes mellitus ([place]) 04/18/2014  . Psoriasis 04/18/2014   Past Medical History:  Diagnosis Date  . Diabetes mellitus without complication ([place])   . Elevated cholesterol   . Psoriasis    History reviewed. No pertinent surgical history. Social History   Tobacco Use  . Smoking status: Former Research scientist (life sciences)  . Smokeless tobacco: Never Used  Substance Use Topics  . Alcohol use: No    Alcohol/week: 0.0 standard drinks  . Drug use: No   Family History  Problem Relation Age of Onset  . Diabetes Mother   . Diabetes Paternal Grandfather   . Hypertension Paternal Grandfather   . Heart disease Neg Hx   . Cancer Neg Hx    No Known Allergies Current Outpatient Medications on File Prior to Visit  Medication Sig Dispense Refill  . BD PEN NEEDLE NANO U/F 32G X 4 MM MISC USE TO INJECT INSULIN ONCE A DAY 100 each 1  . Blood Glucose Monitoring Suppl (ONE TOUCH ULTRA 2) W/DEVICE KIT Check fasting blood sugar and 2 hour post prandial BS in afternoon or evening. E11.9. 1 each 0  .  Continuous Blood Gluc Sensor (FREESTYLE LIBRE 14 DAY SENSOR) MISC 1 Units by Does not apply route every 14 (fourteen) days. 2 each 4  . glucose blood (ONE TOUCH ULTRA TEST) test strip Use as instructed 100 each 0  . Insulin Disposable Pump (V-GO 40) KIT Use 1x every 24 hrs 1 kit 3  . insulin lispro (HUMALOG KWIKPEN) 100 UNIT/ML KiwkPen 15-20 ac tid 15 mL 1  . Insulin Pen Needle (NOVOTWIST) 32G X 5 MM MISC Use one per day to inject victoza 50 each 3  . metFORMIN (GLUCOPHAGE-XR) 500 MG 24 hr tablet Take 4 tablets (2,000 mg total) by mouth daily. 120 tablet 0  . ONETOUCH DELICA LANCETS 24S MISC CHECK FASTING BLOOD SUGAR AND 2 HOUR POST PRANDIAL IN AFTERNOON OR EVENING. 100 each 1  . Ustekinumab (STELARA [place]) Inject into the skin.    [place] VICTOZA 18 MG/3ML SOPN Inject 0.2 mLs (1.2 mg total) into the skin daily. Inject once daily at the  same time 2 pen 3   No current facility-administered medications on file prior to visit.      Review of Systems  Constitutional: Negative for activity change, appetite change, fatigue, fever and unexpected weight change.  HENT: Negative for congestion, rhinorrhea, sore throat and trouble swallowing.   Eyes: Negative for pain, redness, itching and visual disturbance.  Respiratory: Negative for cough, chest tightness, shortness of breath and wheezing.   Cardiovascular: Negative for chest pain and palpitations.  Gastrointestinal: Negative for abdominal pain, blood in stool, constipation, diarrhea and nausea.  Endocrine: Negative for cold intolerance, heat intolerance, polydipsia and polyuria.  Genitourinary: Negative for difficulty urinating, dysuria, frequency and urgency.  Musculoskeletal: Negative for arthralgias, joint swelling and myalgias.  Skin: Negative for pallor and rash.  Neurological: Negative for dizziness, tremors, weakness, numbness and headaches.  Hematological: Negative for adenopathy. Does not bruise/bleed easily.  Psychiatric/Behavioral: Negative for decreased concentration and dysphoric mood. The patient is not nervous/anxious.        Objective:   Physical Exam  Constitutional: He appears well-developed and well-nourished. No distress.  obese and well appearing   HENT:  Head: Normocephalic and atraumatic.  Right Ear: External ear normal.  Left Ear: External ear normal.  Nose: Nose normal.  Mouth/Throat: Oropharynx is clear and moist.  Eyes: Pupils are equal, round, and reactive to light. Conjunctivae and EOM are normal. Right eye exhibits no discharge. Left eye exhibits no discharge. No scleral icterus.  Neck: Normal range of motion. Neck supple. No JVD present. Carotid bruit is not present. No thyromegaly present.  Cardiovascular: Normal rate, regular rhythm, normal heart sounds and intact distal pulses. Exam reveals no gallop.  Pulmonary/Chest: Effort normal and  breath sounds normal. No respiratory distress. He has no wheezes. He has no rales. He exhibits no tenderness.  Abdominal: Soft. Bowel sounds are normal. He exhibits no distension, no abdominal bruit and no mass. There is no tenderness.  Musculoskeletal: He exhibits no edema, tenderness or deformity.  Lymphadenopathy:    He has no cervical adenopathy.  Neurological: He is alert. He has normal reflexes. He displays normal reflexes. No cranial nerve deficit or sensory deficit. He exhibits normal muscle tone. Coordination normal.  Skin: Skin is warm and dry. No rash noted. No erythema. No pallor.  Solar lentigines diffusely    Psychiatric: He has a normal mood and affect.  Pleasant           Assessment & Plan:   Problem List Items Addressed This Visit  Cardiovascular and Mediastinum   Essential hypertension, benign    bp in fair control at this time  BP Readings from Last 1 Encounters:  01/26/18 116/70   No changes needed Most recent labs reviewed  Disc lifstyle change with low sodium diet and exercise        Relevant Medications   atorvastatin (LIPITOR) 10 MG tablet   lisinopril (PRINIVIL,ZESTRIL) 10 MG tablet   Other Relevant Orders   CBC with Differential/Platelet   Comprehensive metabolic panel   TSH     Endocrine   Diabetes mellitus type 2, uncontrolled ([place])    Seeing endocrinology and recently back on medication  Lab Results  Component Value Date   HGBA1C 12.2 (H) 12/03/2017   Strongly enc to start exercise and work towards a DM diet  Also continue to work on wt loss  Eye exam utd  Foot care discussed  Ace for renal protection       Relevant Medications   atorvastatin (LIPITOR) 10 MG tablet   lisinopril (PRINIVIL,ZESTRIL) 10 MG tablet   Hyperlipidemia associated with type 2 diabetes mellitus ([facility])    Disc goals for lipids and reasons to control them Rev last labs with pt Rev low sat fat diet in detail HDL low-disc exercise/fish and fish oil  consumption to help       Relevant Medications   atorvastatin (LIPITOR) 10 MG tablet   lisinopril (PRINIVIL,ZESTRIL) 10 MG tablet     Other   Obesity    Discussed how this problem influences overall health and the risks it imposes  Reviewed plan for weight loss with lower calorie diet (via better food choices and also portion control or program like weight watchers) and exercise building up to or more than 30 minutes 5 days per week including some aerobic activity         Routine general medical examination at a health care facility - Primary    Reviewed health habits including diet and exercise and skin cancer prevention Reviewed appropriate screening tests for age  Also reviewed health mt list, fam hx and immunization status , as well as social and family history   Labs ordered Lipid panel rev Continue DM f/u with endocrinology  Enc exercise  Disc prostate health      Relevant Orders   CBC with Differential/Platelet   Comprehensive metabolic panel   TSH

## 2018-01-26 NOTE — Assessment & Plan Note (Signed)
Reviewed health habits including diet and exercise and skin cancer prevention Reviewed appropriate screening tests for age  Also reviewed health mt list, fam hx and immunization status , as well as social and family history   Labs ordered Lipid panel rev Continue DM f/u with endocrinology  Enc exercise  Disc prostate health

## 2018-01-26 NOTE — Patient Instructions (Addendum)
Look into getting a home gym  Brainstorm ways to add exercise daily    For diabetes  Try to get most of your carbohydrates from produce (with the exception of white potatoes)  Eat less bread/pasta/rice/snack foods/cereals/sweets and other items from the middle of the grocery store (processed carbs)   Your HDL (good cholesterol) is low  Eating fish (with fins) is good for HDL  You can also buy omega 3 fish oil  Exercise really helps also   Labs today  Take care of yourself

## 2018-01-26 NOTE — Assessment & Plan Note (Signed)
bp in fair control at this time  BP Readings from Last 1 Encounters:  01/26/18 116/70   No changes needed Most recent labs reviewed  Disc lifstyle change with low sodium diet and exercise

## 2018-01-26 NOTE — Assessment & Plan Note (Signed)
Discussed how this problem influences overall health and the risks it imposes  Reviewed plan for weight loss with lower calorie diet (via better food choices and also portion control or program like weight watchers) and exercise building up to or more than 30 minutes 5 days per week including some aerobic activity    

## 2018-01-27 LAB — COMPREHENSIVE METABOLIC PANEL
ALT: 8 U/L (ref 0–53)
AST: 10 U/L (ref 0–37)
Albumin: 4.3 g/dL (ref 3.5–5.2)
Alkaline Phosphatase: 84 U/L (ref 39–117)
BUN: 14 mg/dL (ref 6–23)
CO2: 33 mEq/L — ABNORMAL HIGH (ref 19–32)
Calcium: 9.4 mg/dL (ref 8.4–10.5)
Chloride: 99 mEq/L (ref 96–112)
Creatinine, Ser: 0.93 mg/dL (ref 0.40–1.50)
GFR: 94.45 mL/min (ref >60.00)
Glucose, Bld: 127 mg/dL — ABNORMAL HIGH (ref 70–99)
Potassium: 4.2 mEq/L (ref 3.5–5.1)
Sodium: 139 mEq/L (ref 135–145)
Total Bilirubin: 0.4 mg/dL (ref 0.2–1.2)
Total Protein: 7.2 g/dL (ref 6.0–8.3)

## 2018-01-27 LAB — CBC WITH DIFFERENTIAL/PLATELET
Basophils Absolute: 0.1 10*3/uL (ref 0.0–0.1)
Basophils Relative: 0.9 % (ref 0.0–3.0)
Eosinophils Absolute: 0.5 10*3/uL (ref 0.0–0.7)
Eosinophils Relative: 4.8 % (ref 0.0–5.0)
HCT: 43.4 % (ref 39.0–52.0)
Hemoglobin: 15.1 g/dL (ref 13.0–17.0)
Lymphocytes Relative: 24.3 % (ref 12.0–46.0)
Lymphs Abs: 2.4 10*3/uL (ref 0.7–4.0)
MCHC: 34.8 g/dL (ref 30.0–36.0)
MCV: 84 fl (ref 78.0–100.0)
Monocytes Absolute: 0.6 10*3/uL (ref 0.1–1.0)
Monocytes Relative: 6.3 % (ref 3.0–12.0)
Neutro Abs: 6.3 10*3/uL (ref 1.4–7.7)
Neutrophils Relative %: 63.7 % (ref 43.0–77.0)
Platelets: 289 10*3/uL (ref 150.0–400.0)
RBC: 5.17 Mil/uL (ref 4.22–5.81)
RDW: 13.3 % (ref 11.5–15.5)
WBC: 9.9 10*3/uL (ref 4.0–10.5)

## 2018-01-27 LAB — TSH: TSH: 2.43 u[IU]/mL (ref 0.35–4.50)

## 2018-01-31 ENCOUNTER — Ambulatory Visit
Admission: RE | Admit: 2018-01-31 | Discharge: 2018-01-31 | Disposition: A | Discharge status: Home / Self Care | Payer: No Typology Code available for payment source | Source: Ambulatory Visit | Attending: Nurse Practitioner | Admitting: Nurse Practitioner

## 2018-01-31 ENCOUNTER — Other Ambulatory Visit: Payer: Self-pay | Admitting: Nurse Practitioner

## 2018-01-31 DIAGNOSIS — R52 Pain, unspecified: Secondary | ICD-10-CM

## 2018-02-01 DIAGNOSIS — L400 Psoriasis vulgaris: Secondary | ICD-10-CM | POA: Diagnosis not present

## 2018-02-08 ENCOUNTER — Other Ambulatory Visit: Payer: Self-pay | Admitting: Endocrinology

## 2018-02-08 DIAGNOSIS — E1165 Type 2 diabetes mellitus with hyperglycemia: Secondary | ICD-10-CM

## 2018-02-08 DIAGNOSIS — Z794 Long term (current) use of insulin: Principal | ICD-10-CM

## 2018-02-09 ENCOUNTER — Other Ambulatory Visit (INDEPENDENT_AMBULATORY_CARE_PROVIDER_SITE_OTHER): Payer: 59

## 2018-02-09 DIAGNOSIS — Z794 Long term (current) use of insulin: Secondary | ICD-10-CM | POA: Diagnosis not present

## 2018-02-09 DIAGNOSIS — E1165 Type 2 diabetes mellitus with hyperglycemia: Secondary | ICD-10-CM

## 2018-02-09 LAB — GLUCOSE, RANDOM: Glucose, Bld: 202 mg/dL — ABNORMAL HIGH (ref 70–99)

## 2018-02-09 LAB — HEMOGLOBIN A1C: Hgb A1c MFr Bld: 10 % — ABNORMAL HIGH (ref 4.6–6.5)

## 2018-02-09 LAB — MICROALBUMIN / CREATININE URINE RATIO
Creatinine,U: 153.3 mg/dL
Microalb Creat Ratio: 2.5 mg/g (ref 0.0–30.0)
Microalb, Ur: 3.8 mg/dL — ABNORMAL HIGH (ref 0.0–1.9)

## 2018-02-13 NOTE — Progress Notes (Signed)
Patient ID: Todd Gaines, male   DOB: 1975-06-19, 42 y.o.   MRN: [id number]           Reason for Appointment: Follow-up for Type 2 Diabetes  Referring physician: [facility]  History of Present Illness:          Date of diagnosis of type 2 diabetes mellitus: 2012       Background history:   He believes her A1c was 12% when he was first diagnosed to have diabetes and was having symptoms of feeling very tired. Initially he made changes in his lifestyle and was able to lose some weight also He was probably treated with metformin and may have been given Tradjenta subsequently Previous records are not available He thinks his A1c had been around 7-7.5% for some time until last year He was started on basal insulin in 9/16 when his A1c was 9.9 He did not use the V-go pump because of lack of adequate insurance coverage and out-of-pocket expense, this was improving his control  Recent history:   INSULIN dosage: Basaglar 40 units daily in the evening Humalog before breakfast and dinner 8-10 units  Non-insulin hypoglycemic drugs the patient is taking are: Metformin ER 1000 mg a day, Victoza 1.8 mg daily  His A1c is only slightly better at 10% compared to 12.2  Current blood sugar patterns and problems identified:   He was last seen in September and was told to start taking Humalog insulin before meals to help postprandial hyperglycemia  However he has only taken 8 to 10 units and not clear how he is adjusting it because he does not check readings after meals except occasionally  Also does not take any insulin at lunchtime since he cannot carry the Humalog with him at work  He cannot explain why his blood sugar was 202 in the morning but he thinks it may not have been fasting; emphasize he claims that his FASTING blood sugars are 140-150  As usually he does not bring his monitor consistently to the office for review  Not doing any formal exercise but some general activity at home and  work  Recently he says he was having headaches in the mornings and nausea and he tried cutting back on his Metformin and with taking only 2 tablets of the 500 mg his symptoms are relieved  However he has been able to continue 1.8 mg of Victoza  Weight is about the same   Side effects from medications have been: Transient nausea from Trulicity  Compliance with the medical regimen: Fair    Glucose monitoring:  0-1 times a day         Glucometer:  One Touch Blood Glucose readings by recall:   PRE-MEAL Fasting Lunch Dinner Bedtime Overall  Glucose range: 140-150 ?     Mean/median:        POST-MEAL PC Breakfast PC Lunch PC Dinner  Glucose range:   120  Mean/median:        Self-care:  Typical meal intake: Breakfast is eggs, sausage sometimes, otherwise may have breakfast burrito  Snacks will be peanut butter crackers, chips, pretzels and peanut butter               Dietician visit, most recent: 10/16               Exercise: active at work   Weight history: 275-298 lbs  Wt Readings from Last 3 Encounters:  02/14/18 268 lb 12.8 oz (121.9 kg)  01/26/18 271  lb 8 oz (123.2 kg)  12/06/17 266 lb (120.7 kg)    Glycemic control:   Lab Results  Component Value Date   HGBA1C 10.0 (H) 02/09/2018   HGBA1C 12.2 (H) 12/03/2017   HGBA1C 11.6 (H) 02/16/2017   Lab Results  Component Value Date   MICROALBUR 3.8 (H) 02/09/2018   LDLCALC 66 12/03/2017   CREATININE 0.93 01/26/2018    Lab on 02/09/2018  Component Date Value Ref Range Status  . Glucose, Bld 02/09/2018 202* 70 - 99 mg/dL Final  . Microalb, Ur 02/09/2018 3.8* 0.0 - 1.9 mg/dL Final  . Creatinine,U 02/09/2018 153.3  mg/dL Final  . Microalb Creat Ratio 02/09/2018 2.5  0.0 - 30.0 mg/g Final  . Hgb A1c MFr Bld 02/09/2018 10.0* 4.6 - 6.5 % Final   Glycemic Control Guidelines for People with Diabetes:Non Diabetic:  <6%Goal of Therapy: <7%Additional Action Suggested:  >8%       Allergies as of 02/14/2018   No Known  Allergies     Medication List        Accurate as of 02/14/18  3:06 PM. Always use your most recent med list.          atorvastatin 10 MG tablet Commonly known as:  LIPITOR Take 1 tablet (10 mg total) by mouth daily.   BASAGLAR KWIKPEN 100 UNIT/ML Sopn Inject 40 Units into the skin at bedtime. Inject 40 units under the skin at bedtime.   glucose blood test strip Use as instructed   insulin lispro 100 UNIT/ML KiwkPen Commonly known as:  HUMALOG 15-20 ac tid   Insulin Pen Needle 32G X 5 MM Misc Use one per day to inject victoza   BD PEN NEEDLE NANO U/F 32G X 4 MM Misc Generic drug:  Insulin Pen Needle USE TO INJECT INSULIN ONCE A DAY   lisinopril 10 MG tablet Commonly known as:  PRINIVIL,ZESTRIL Take 1 tablet (10 mg total) by mouth daily.   metFORMIN 500 MG 24 hr tablet Commonly known as:  GLUCOPHAGE-XR Take 4 tablets (2,000 mg total) by mouth daily.   ONE TOUCH ULTRA 2 w/Device Kit Check fasting blood sugar and 2 hour post prandial BS in afternoon or evening. E11.9.   ONETOUCH DELICA LANCETS 31V Misc CHECK FASTING BLOOD SUGAR AND 2 HOUR POST PRANDIAL IN AFTERNOON OR EVENING.   STELARA [place] Inject into the skin.   VICTOZA 18 MG/3ML Sopn Generic drug:  liraglutide Inject 0.2 mLs (1.2 mg total) into the skin daily. Inject once daily at the same time       Allergies: No Known Allergies  Past Medical History:  Diagnosis Date  . Diabetes mellitus without complication ([place])   . Elevated cholesterol   . Psoriasis     History reviewed. No pertinent surgical history.  Family History  Problem Relation Age of Onset  . Diabetes Mother   . Diabetes Paternal Grandfather   . Hypertension Paternal Grandfather   . Heart disease Neg Hx   . Cancer Neg Hx     Social History:  reports that he has quit smoking. He has never used smokeless tobacco. He reports that he does not drink alcohol or use drugs.    Review of Systems    Lipid history: On treatment with  Lipitor 10 mg for long-term with adequate control Has persistently low HDL   Lab Results  Component Value Date   CHOL 117 12/03/2017   HDL 27.60 (L) 12/03/2017   LDLCALC 66 12/03/2017   LDLDIRECT 89.0 06/23/2017  TRIG 119.0 12/03/2017   CHOLHDL 4 12/03/2017            Hypertension: Blood pressure is controlled with lisinopril prescribed by PCP No recurrence of hyperkalemia  Lab Results  Component Value Date   K 4.2 01/26/2018     No retinopathy on his last exam in March 2018   LABS:  Lab on 02/09/2018  Component Date Value Ref Range Status  . Glucose, Bld 02/09/2018 202* 70 - 99 mg/dL Final  . Microalb, Ur 02/09/2018 3.8* 0.0 - 1.9 mg/dL Final  . Creatinine,U 02/09/2018 153.3  mg/dL Final  . Microalb Creat Ratio 02/09/2018 2.5  0.0 - 30.0 mg/g Final  . Hgb A1c MFr Bld 02/09/2018 10.0* 4.6 - 6.5 % Final   Glycemic Control Guidelines for People with Diabetes:Non Diabetic:  <6%Goal of Therapy: <7%Additional Action Suggested:  >8%     Physical Examination:  BP 132/82 (BP Location: Left Arm, Patient Position: Sitting, Cuff Size: Normal)   Pulse 97   Ht 6' 0.5" (1.842 m)   Wt 268 lb 12.8 oz (121.9 kg)   SpO2 97%   BMI 35.95 kg/m        ASSESSMENT:  Diabetes type 2, uncontrolled with obesity  See history of present illness for detailed discussion of his current management, blood sugar patterns and problems identified  His A1c is still not adequate and up at 10%  He does probably have better readings from adding mealtime insulin at breakfast and suppertime However still appears to be having high fasting readings Also without adequate home monitoring difficult to know what his blood sugar patterns are Glucose in the lab was 202, not clear if this was fasting May also be benefiting from increased Victoza  Hypertension and history of hyperkalemia: Controlled Urine microalbumin normal  PLAN:     He will try to use a freestyle libre since he should not have  any trouble keeping it on with cold weather  Discussed need to check sugars at all different times  Discussed fasting blood sugar targets of 120  He will need to go up at least 4 units on his Basaglar  Also if his sugars are consistently high after breakfast or supper he needs to add 2 to 4 units more of the Humalog  Most likely if not being active he needs to at least take Humalog at lunchtime on weekends Also discussed again the use of the Omnipod insulin pump and to see if this would be more affordable next year with insurance coverage Continue 1.8 mg Victoza and reduced dose of metformin for now  There are no Patient Instructions on file for this visit.  Counseling time on subjects discussed in assessment and plan sections is over 50% of today's 25 minute visit  Elayne Snare 02/14/2018, 3:06 PM   Note: This office note was prepared with Dragon voice recognition system technology. Any transcriptional errors that result from this process are unintentional.

## 2018-02-14 ENCOUNTER — Encounter: Payer: Self-pay | Admitting: Endocrinology

## 2018-02-14 ENCOUNTER — Other Ambulatory Visit: Payer: Self-pay

## 2018-02-14 ENCOUNTER — Ambulatory Visit (INDEPENDENT_AMBULATORY_CARE_PROVIDER_SITE_OTHER): Payer: 59 | Admitting: Endocrinology

## 2018-02-14 VITALS — BP 132/82 | HR 97 | Ht 72.5 in | Wt 268.8 lb

## 2018-02-14 DIAGNOSIS — Z794 Long term (current) use of insulin: Secondary | ICD-10-CM | POA: Diagnosis not present

## 2018-02-14 DIAGNOSIS — I10 Essential (primary) hypertension: Secondary | ICD-10-CM | POA: Diagnosis not present

## 2018-02-14 DIAGNOSIS — E1165 Type 2 diabetes mellitus with hyperglycemia: Secondary | ICD-10-CM

## 2018-02-14 MED ORDER — METFORMIN HCL ER 500 MG PO TB24: 2000.0000 mg | ORAL_TABLET | Freq: Every day | ORAL | 0 refills | Status: DC

## 2018-02-14 MED ORDER — BASAGLAR KWIKPEN 100 UNIT/ML ~~LOC~~ SOPN: 44.0000 [IU] | PEN_INJECTOR | Freq: Every day | SUBCUTANEOUS | 1 refills | Status: DC

## 2018-02-14 MED ORDER — FREESTYLE LIBRE 14 DAY SENSOR MISC: 1.0000 [IU] | 4 refills | Status: DC

## 2018-02-14 NOTE — Patient Instructions (Addendum)
Take 44 Basaglar in pm   Check blood sugars on waking up 5 days a week  Also check blood sugars about 2 hours after meals and do this after different meals by rotation  Recommended blood sugar levels on waking up are 90-130 and about 2 hours after meal is 130-160  Please bring your blood sugar monitor to each visit, thank you  Humalog at lunch if needed  Victoza 1.8mg  daily

## 2018-04-19 ENCOUNTER — Other Ambulatory Visit: Payer: Self-pay

## 2018-04-20 ENCOUNTER — Other Ambulatory Visit (INDEPENDENT_AMBULATORY_CARE_PROVIDER_SITE_OTHER): Payer: 59

## 2018-04-20 DIAGNOSIS — E1165 Type 2 diabetes mellitus with hyperglycemia: Secondary | ICD-10-CM

## 2018-04-20 DIAGNOSIS — Z794 Long term (current) use of insulin: Secondary | ICD-10-CM | POA: Diagnosis not present

## 2018-04-20 LAB — BASIC METABOLIC PANEL
BUN: 11 mg/dL (ref 6–23)
CO2: 28 mEq/L (ref 19–32)
Calcium: 9 mg/dL (ref 8.4–10.5)
Chloride: 101 mEq/L (ref 96–112)
Creatinine, Ser: 0.88 mg/dL (ref 0.40–1.50)
GFR: 94.61 mL/min (ref >60.00)
Glucose, Bld: 144 mg/dL — ABNORMAL HIGH (ref 70–99)
Potassium: 4.7 mEq/L (ref 3.5–5.1)
Sodium: 138 mEq/L (ref 135–145)

## 2018-04-21 ENCOUNTER — Ambulatory Visit: Payer: Self-pay | Admitting: Endocrinology

## 2018-04-21 LAB — FRUCTOSAMINE: Fructosamine: 400 umol/L — ABNORMAL HIGH (ref 0–285)

## 2018-04-26 ENCOUNTER — Ambulatory Visit (INDEPENDENT_AMBULATORY_CARE_PROVIDER_SITE_OTHER): Payer: 59 | Admitting: Endocrinology

## 2018-04-26 ENCOUNTER — Encounter: Payer: Self-pay | Admitting: Endocrinology

## 2018-04-26 VITALS — BP 118/83 | HR 106 | Ht 72.5 in | Wt 273.8 lb

## 2018-04-26 DIAGNOSIS — Z6836 Body mass index (BMI) 36.0-36.9, adult: Secondary | ICD-10-CM | POA: Diagnosis not present

## 2018-04-26 DIAGNOSIS — Z794 Long term (current) use of insulin: Secondary | ICD-10-CM | POA: Diagnosis not present

## 2018-04-26 DIAGNOSIS — E1165 Type 2 diabetes mellitus with hyperglycemia: Secondary | ICD-10-CM | POA: Diagnosis not present

## 2018-04-26 MED ORDER — CANAGLIFLOZIN 100 MG PO TABS: ORAL_TABLET | ORAL | 3 refills | Status: DC

## 2018-04-26 NOTE — Patient Instructions (Addendum)
Take upto 18 Humalog at supper  Stop lisinopril  Check blood sugars on waking up 4 days a week  Also check blood sugars about 2 hours after meals and do this after different meals by rotation  Recommended blood sugar levels on waking up are 90-130 and about 2 hours after meal is 130-160  Please bring your blood sugar monitor to each visit, thank you

## 2018-04-26 NOTE — Progress Notes (Signed)
Patient ID: Todd Gaines, male   DOB: [birth year], 43 y.o.   MRN: [id number]           Reason for Appointment: Follow-up for Type 2 Diabetes  Referring physician: [facility]  History of Present Illness:          Date of diagnosis of type 2 diabetes mellitus: 2012       Background history:   He believes her A1c was 12% when he was first diagnosed to have diabetes and was having symptoms of feeling very tired. Initially he made changes in his lifestyle and was able to lose some weight also He was probably treated with metformin and may have been given Tradjenta subsequently Previous records are not available He thinks his A1c had been around 7-7.5% for some time until last year He was started on basal insulin in 9/16 when his A1c was 9.9 He did not use the V-go pump because of lack of adequate insurance coverage and out-of-pocket expense, this was improving his control  Recent history:   INSULIN dosage: Basaglar 40 units daily in the evening Humalog before breakfast and dinner 8-10 units  Non-insulin hypoglycemic drugs the patient is taking are: Metformin ER 2000 mg a day, Victoza 1.8 mg daily  His A1c is 10% on the last visit Fructosamine is 400     Current blood sugar patterns and problems identified:   He still has not been able to verify his coverage for the OmniPod  He thinks his blood sugars are higher because of getting steroids periodically for poison ivy in January  However not clear what his blood sugar patterns are since he again did not bring his meter  He is not watching his diet with high fat intake and usually this affects his blood sugars even overnight  Also not taking any insulin at lunchtime because he is active at work but again not clear what his blood sugars are in the afternoon  Has gained 5 pounds  He was told to go up to 44 on the [place] but is still taking 40  Also not clear if he has any consistently high readings after dinner at night  He did  not use the freestyle libre sensor because of the high cost   Side effects from medications have been: Transient nausea from Trulicity  Compliance with the medical regimen: Fair    Glucose monitoring:  0-1 times a day         Glucometer:  One Touch Blood Glucose readings by recall:   PRE-MEAL Fasting Lunch Dinner Bedtime Overall  Glucose range: 120-200      Mean/median:        POST-MEAL PC Breakfast PC Lunch PC Dinner  Glucose range:  ? 220  Mean/median:        Self-care:  Typical meal intake: Breakfast is eggs, sausage sometimes, otherwise may have breakfast burrito  Snacks will be peanut butter crackers, chips, pretzels and peanut butter               Dietician visit, most recent: 10/16               Exercise: active at work   Weight history: 275-298 lbs  Wt Readings from Last 3 Encounters:  04/26/18 273 lb 12.8 oz (124.2 kg)  02/14/18 268 lb 12.8 oz (121.9 kg)  01/26/18 271 lb 8 oz (123.2 kg)    Glycemic control:   Lab Results  Component Value Date   HGBA1C 10.0 (H) 02/09/2018  HGBA1C 12.2 (H) 12/03/2017   HGBA1C 11.6 (H) 02/16/2017   Lab Results  Component Value Date   MICROALBUR 3.8 (H) 02/09/2018   LDLCALC 66 12/03/2017   CREATININE 0.88 04/20/2018    Lab on 04/20/2018  Component Date Value Ref Range Status  . Sodium 04/20/2018 138  135 - 145 mEq/L Final  . Potassium 04/20/2018 4.7  3.5 - 5.1 mEq/L Final  . Chloride 04/20/2018 101  96 - 112 mEq/L Final  . CO2 04/20/2018 28  19 - 32 mEq/L Final  . Glucose, Bld 04/20/2018 144* 70 - 99 mg/dL Final  . BUN 04/20/2018 11  6 - 23 mg/dL Final  . Creatinine, Ser 04/20/2018 0.88  0.40 - 1.50 mg/dL Final  . Calcium 04/20/2018 9.0  8.4 - 10.5 mg/dL Final  . GFR 04/20/2018 94.61  >60.00 mL/min Final  . Fructosamine 04/20/2018 400* 0 - 285 umol/L Final   Comment: Published reference interval for apparently healthy subjects between age 27 and 78 is 88 - 285 umol/L and in a poorly controlled diabetic  population is 228 - 563 umol/L with a mean of 396 umol/L.       Allergies as of 04/26/2018   No Known Allergies     Medication List       Accurate as of April 26, 2018  9:27 AM. Always use your most recent med list.        atorvastatin 10 MG tablet Commonly known as:  LIPITOR Take 1 tablet (10 mg total) by mouth daily.   BASAGLAR KWIKPEN 100 UNIT/ML Sopn Inject 0.44 mLs (44 Units total) into the skin at bedtime. Inject 40 units under the skin at bedtime.   canagliflozin 100 MG Tabs tablet Commonly known as:  INVOKANA 1 tablet before breakfast   FREESTYLE LIBRE 14 DAY SENSOR Misc 1 Units by Does not apply route every 14 (fourteen) days.   glucose blood test strip Commonly known as:  ONE TOUCH ULTRA TEST Use as instructed   insulin lispro 100 UNIT/ML KiwkPen Commonly known as:  HUMALOG KWIKPEN 15-20 ac tid   Insulin Pen Needle 32G X 5 MM Misc Commonly known as:  NOVOTWIST Use one per day to inject victoza   BD PEN NEEDLE NANO U/F 32G X 4 MM Misc Generic drug:  Insulin Pen Needle USE TO INJECT INSULIN ONCE A DAY   lisinopril 10 MG tablet Commonly known as:  PRINIVIL,ZESTRIL Take 1 tablet (10 mg total) by mouth daily.   metFORMIN 500 MG 24 hr tablet Commonly known as:  GLUCOPHAGE-XR Take 4 tablets (2,000 mg total) by mouth daily.   ONE TOUCH ULTRA 2 w/Device Kit Check fasting blood sugar and 2 hour post prandial BS in afternoon or evening. E11.9.   ONETOUCH DELICA LANCETS 89F Misc CHECK FASTING BLOOD SUGAR AND 2 HOUR POST PRANDIAL IN AFTERNOON OR EVENING.   STELARA [place] Inject into the skin.   VICTOZA 18 MG/3ML Sopn Generic drug:  liraglutide Inject 0.2 mLs (1.2 mg total) into the skin daily. Inject once daily at the same time       Allergies: No Known Allergies  Past Medical History:  Diagnosis Date  . Diabetes mellitus without complication ([place])   . Elevated cholesterol   . Psoriasis     History reviewed. No pertinent surgical  history.  Family History  Problem Relation Age of Onset  . Diabetes Mother   . Diabetes Paternal Grandfather   . Hypertension Paternal Grandfather   . Heart disease Neg Hx   .  Cancer Neg Hx     Social History:  reports that he has quit smoking. He has never used smokeless tobacco. He reports that he does not drink alcohol or use drugs.    Review of Systems    Lipid history: On treatment with Lipitor 10 mg for long-term with adequate control Has persistently low HDL   Lab Results  Component Value Date   CHOL 117 12/03/2017   HDL 27.60 (L) 12/03/2017   LDLCALC 66 12/03/2017   LDLDIRECT 89.0 06/23/2017   TRIG 119.0 12/03/2017   CHOLHDL 4 12/03/2017            Hypertension: Blood pressure is treated with lisinopril prescribed by PCP and he is not apparently having hypertension as a baseline diagnosis  No recent problems of hyperkalemia  Lab Results  Component Value Date   K 4.7 04/20/2018        LABS:  Lab on 04/20/2018  Component Date Value Ref Range Status  . Sodium 04/20/2018 138  135 - 145 mEq/L Final  . Potassium 04/20/2018 4.7  3.5 - 5.1 mEq/L Final  . Chloride 04/20/2018 101  96 - 112 mEq/L Final  . CO2 04/20/2018 28  19 - 32 mEq/L Final  . Glucose, Bld 04/20/2018 144* 70 - 99 mg/dL Final  . BUN 04/20/2018 11  6 - 23 mg/dL Final  . Creatinine, Ser 04/20/2018 0.88  0.40 - 1.50 mg/dL Final  . Calcium 04/20/2018 9.0  8.4 - 10.5 mg/dL Final  . GFR 04/20/2018 94.61  >60.00 mL/min Final  . Fructosamine 04/20/2018 400* 0 - 285 umol/L Final   Comment: Published reference interval for apparently healthy subjects between age 51 and 12 is 75 - 285 umol/L and in a poorly controlled diabetic population is 228 - 563 umol/L with a mean of 396 umol/L.     Physical Examination:  BP 118/83 (BP Location: Left Arm, Patient Position: Sitting, Cuff Size: Large)   Pulse (!) 106   Ht 6' 0.5" (1.842 m)   Wt 273 lb 12.8 oz (124.2 kg)   SpO2 97%   BMI 36.62 kg/m         ASSESSMENT:  Diabetes type 2, uncontrolled with obesity  See history of present illness for detailed discussion of his current management, blood sugar patterns and problems identified  His A1c has been high and last 10% Fructosamine of 400 indicates no improvement  He has high blood sugars mostly because of not taking enough insulin for his meals as well as poor diet with high fat or high carbohydrate intake This causes normally postprandial hyperglycemia but also raises blood sugars overnight also He is not checking blood sugars enough most likely also and keeps forgetting to bring his meter  Because of his poor control and inadequate compliance with insulin and diet he may benefit from another agent like Invokana   Hypertension and history of hyperkalemia: Controlled Urine microalbumin normal  PLAN:     He will start 100 mg of Invokana  Discussed action of SGLT 2 drugs on lowering glucose by decreasing kidney absorption of glucose, benefits of weight loss and lower blood pressure, possible side effects including candidiasis and dosage regimen   With this he will stop Prinivil  He may not need to adjust his insulin since his blood sugars are mostly high but based on his current blood sugars he likely needs more insulin to cover his evening meal  Also he needs to start checking sugars before dinnertime to see if  he needs coverage for his lunch  Discussed that we can keep his blood sugar reading better controlled with Invokana and with weight loss he may eventually need less insulin  He will let us know if he starts getting significantly lower blood sugars  Follow-up in 2 months for A1c and recheck renal functions Needs to bring his monitor for download on each visit including readings after dinner Continue 1.8 mg Victoza and reduced dose of metformin for now  Patient Instructions  Take upto 18 Humalog at supper  Stop lisinopril  Check blood sugars on waking up 4 days  a week  Also check blood sugars about 2 hours after meals and do this after different meals by rotation  Recommended blood sugar levels on waking up are 90-130 and about 2 hours after meal is 130-160  Please bring your blood sugar monitor to each visit, thank you     Counseling time on subjects discussed in assessment and plan sections is over 50% of today's 25 minute visit  Elayne Snare 04/26/2018, 9:27 AM   Note: This office note was prepared with Dragon voice recognition system technology. Any transcriptional errors that result from this process are unintentional.

## 2018-05-30 ENCOUNTER — Other Ambulatory Visit: Payer: Self-pay | Admitting: Endocrinology

## 2018-05-31 NOTE — Telephone Encounter (Signed)
Previous office visit note stated that the pt should reduce amount of Metformin daily, but no directions given on the reduced dose. Please provide directions.

## 2018-05-31 NOTE — Telephone Encounter (Signed)
Do not reduce the dose on the prescription

## 2018-06-30 ENCOUNTER — Other Ambulatory Visit (INDEPENDENT_AMBULATORY_CARE_PROVIDER_SITE_OTHER): Payer: 59

## 2018-06-30 ENCOUNTER — Other Ambulatory Visit: Payer: Self-pay

## 2018-06-30 DIAGNOSIS — Z794 Long term (current) use of insulin: Secondary | ICD-10-CM | POA: Diagnosis not present

## 2018-06-30 DIAGNOSIS — E1165 Type 2 diabetes mellitus with hyperglycemia: Secondary | ICD-10-CM | POA: Diagnosis not present

## 2018-06-30 LAB — BASIC METABOLIC PANEL
BUN: 13 mg/dL (ref 6–23)
CO2: 30 mEq/L (ref 19–32)
Calcium: 9.6 mg/dL (ref 8.4–10.5)
Chloride: 100 mEq/L (ref 96–112)
Creatinine, Ser: 0.92 mg/dL (ref 0.40–1.50)
GFR: 89.8 mL/min (ref >60.00)
Glucose, Bld: 134 mg/dL — ABNORMAL HIGH (ref 70–99)
Potassium: 4.8 mEq/L (ref 3.5–5.1)
Sodium: 138 mEq/L (ref 135–145)

## 2018-06-30 LAB — HEMOGLOBIN A1C: Hgb A1c MFr Bld: 9.4 % — ABNORMAL HIGH (ref 4.6–6.5)

## 2018-07-04 NOTE — Progress Notes (Signed)
Patient ID: Todd Gaines, male   DOB: January 04, 1976, 43 y.o.   MRN: [id number]           Reason for Appointment: Follow-up for Type 2 Diabetes  Referring physician: [facility]   Today's office visit was provided via telemedicine using video technique Explained to the patient and the the limitations of evaluation and management by telemedicine and the availability of in person appointments.  The patient understood the limitations and agreed to proceed. Patient also understood that the telehealth visit is billable. . Location of the patient: Home . Location of the provider: Office Only the patient and myself were participating in the encounter    History of Present Illness:          Date of diagnosis of type 2 diabetes mellitus: 2012       Background history:   He believes her A1c was 12% when he was first diagnosed to have diabetes and was having symptoms of feeling very tired. Initially he made changes in his lifestyle and was able to lose some weight also He was probably treated with metformin and may have been given Tradjenta subsequently Previous records are not available He thinks his A1c had been around 7-7.5% for some time until last year He was started on basal insulin in 9/16 when his A1c was 9.9 He did not use the V-go pump because of lack of adequate insurance coverage and out-of-pocket expense, this was improving his control  Recent history:   INSULIN dosage: Basaglar 40 units daily in the evening Humalog before breakfast and dinner 8-10 units  Non-insulin hypoglycemic drugs the patient is taking are: Metformin ER 2000 mg a day, Victoza 1.8 mg daily, Invokana 100 mg daily  His A1c is 9.4, previously 10   Current blood sugar patterns and problems identified:   He still has not been regular with taking his Humalog to cover his meals  Most of his blood sugars are after breakfast  He says that he is frequently not remembering to take his insulin before eating and will  check his sugar after eating breakfast and then take his insulin sometimes  Otherwise he refuses to take insulin at lunchtime and since he does not want to carry the pen with him  Most of his blood sugars are significantly high after supper  He was told to cut back on high fat foods in the evenings and he may be doing better somewhat  FASTING blood sugars are difficult to assess, was 134 in the lab  He does not use the freestyle libre sensor because of the high cost out of pocket   Side effects from medications have been: Transient nausea from Trulicity  Compliance with the medical regimen: Fair    Glucose monitoring:  0-1 times a day         Glucometer:  One Touch Blood Glucose readings by patient review of meter:   PRE-MEAL Fasting Lunch Dinner Bedtime Overall  Glucose range:  139, 209    98   Mean/median:        POST-MEAL PC Breakfast PC Lunch PC Dinner  Glucose range:  158-208 ?   194-290  Mean/median:        Self-care:  Typical meal intake: Breakfast is eggs, sausage sometimes, otherwise may have breakfast burrito  Snacks will be peanut butter crackers, chips, pretzels and peanut butter               Dietician visit, most recent: 10/16  Exercise: active at work   Weight history: 275-298 lbs  Wt Readings from Last 3 Encounters:  04/26/18 273 lb 12.8 oz (124.2 kg)  02/14/18 268 lb 12.8 oz (121.9 kg)  01/26/18 271 lb 8 oz (123.2 kg)    Glycemic control:   Lab Results  Component Value Date   HGBA1C 9.4 (H) 06/30/2018   HGBA1C 10.0 (H) 02/09/2018   HGBA1C 12.2 (H) 12/03/2017   Lab Results  Component Value Date   MICROALBUR 3.8 (H) 02/09/2018   LDLCALC 66 12/03/2017   CREATININE 0.92 06/30/2018    Lab on 06/30/2018  Component Date Value Ref Range Status  . Sodium 06/30/2018 138  135 - 145 mEq/L Final  . Potassium 06/30/2018 4.8  3.5 - 5.1 mEq/L Final  . Chloride 06/30/2018 100  96 - 112 mEq/L Final  . CO2 06/30/2018 30  19 - 32 mEq/L  Final  . Glucose, Bld 06/30/2018 134* 70 - 99 mg/dL Final  . BUN 06/30/2018 13  6 - 23 mg/dL Final  . Creatinine, Ser 06/30/2018 0.92  0.40 - 1.50 mg/dL Final  . Calcium 06/30/2018 9.6  8.4 - 10.5 mg/dL Final  . GFR 06/30/2018 89.80  >60.00 mL/min Final  . Hgb A1c MFr Bld 06/30/2018 9.4* 4.6 - 6.5 % Final   Glycemic Control Guidelines for People with Diabetes:Non Diabetic:  <6%Goal of Therapy: <7%Additional Action Suggested:  >8%       Allergies as of 07/05/2018   No Known Allergies     Medication List       Accurate as of July 04, 2018  9:15 PM. Always use your most recent med list.        atorvastatin 10 MG tablet Commonly known as:  LIPITOR Take 1 tablet (10 mg total) by mouth daily.   Basaglar KwikPen 100 UNIT/ML Sopn Inject 0.44 mLs (44 Units total) into the skin at bedtime. Inject 40 units under the skin at bedtime.   canagliflozin 100 MG Tabs tablet Commonly known as:  Invokana 1 tablet before breakfast   glucose blood test strip Commonly known as:  ONE TOUCH ULTRA TEST Use as instructed   insulin lispro 100 UNIT/ML KiwkPen Commonly known as:  HumaLOG KwikPen 15-20 ac tid   Insulin Pen Needle 32G X 5 MM Misc Commonly known as:  NovoTwist Use one per day to inject victoza   BD Pen Needle Nano U/F 32G X 4 MM Misc Generic drug:  Insulin Pen Needle USE TO INJECT INSULIN ONCE A DAY   lisinopril 10 MG tablet Commonly known as:  ZESTRIL Take 1 tablet (10 mg total) by mouth daily.   metFORMIN 500 MG 24 hr tablet Commonly known as:  GLUCOPHAGE-XR TAKE FOUR TABLETS BY MOUTH DAILY   ONE TOUCH ULTRA 2 w/Device Kit Check fasting blood sugar and 2 hour post prandial BS in afternoon or evening. E11.9.   OneTouch Delica Lancets 52W Misc CHECK FASTING BLOOD SUGAR AND 2 HOUR POST PRANDIAL IN AFTERNOON OR EVENING.   STELARA [place] Inject into the skin.   Victoza 18 MG/3ML Sopn Generic drug:  liraglutide Inject 0.2 mLs (1.2 mg total) into the skin daily. Inject  once daily at the same time       Allergies: No Known Allergies  Past Medical History:  Diagnosis Date  . Diabetes mellitus without complication ([facility])   . Elevated cholesterol   . Psoriasis     No past surgical history on file.  Family History  Problem Relation Age of Onset  .  Diabetes Mother   . Diabetes Paternal Grandfather   . Hypertension Paternal Grandfather   . Heart disease Neg Hx   . Cancer Neg Hx     Social History:  reports that he has quit smoking. He has never used smokeless tobacco. He reports that he does not drink alcohol or use drugs.    Review of Systems    Lipid history: On treatment with Lipitor 10 mg for long-term with adequate control Has persistently low HDL   Lab Results  Component Value Date   CHOL 117 12/03/2017   HDL 27.60 (L) 12/03/2017   LDLCALC 66 12/03/2017   LDLDIRECT 89.0 06/23/2017   TRIG 119.0 12/03/2017   CHOLHDL 4 12/03/2017            Hypertension: Blood pressure previously was treated with lisinopril 10 mg but this was stopped with starting Invokana No renal dysfunction  Lab Results  Component Value Date   K 4.8 06/30/2018       LABS:  Lab on 06/30/2018  Component Date Value Ref Range Status  . Sodium 06/30/2018 138  135 - 145 mEq/L Final  . Potassium 06/30/2018 4.8  3.5 - 5.1 mEq/L Final  . Chloride 06/30/2018 100  96 - 112 mEq/L Final  . CO2 06/30/2018 30  19 - 32 mEq/L Final  . Glucose, Bld 06/30/2018 134* 70 - 99 mg/dL Final  . BUN 06/30/2018 13  6 - 23 mg/dL Final  . Creatinine, Ser 06/30/2018 0.92  0.40 - 1.50 mg/dL Final  . Calcium 06/30/2018 9.6  8.4 - 10.5 mg/dL Final  . GFR 06/30/2018 89.80  >60.00 mL/min Final  . Hgb A1c MFr Bld 06/30/2018 9.4* 4.6 - 6.5 % Final   Glycemic Control Guidelines for People with Diabetes:Non Diabetic:  <6%Goal of Therapy: <7%Additional Action Suggested:  >8%     Physical Examination:  There were no vitals taken for this visit.       ASSESSMENT:  Diabetes type 2,  uncontrolled with obesity  See history of present illness for detailed discussion of his current management, blood sugar patterns and problems identified  His A1c has been persistently high and only slightly better at 9.4 compared to 10%  He is still having postprandial hyperglycemia from not taking enough Humalog as well as forgetting to take his Humalog either at breakfast or lunch Also may not be consistent with his diet Has only benefited marginally from adding Invokana 100 mg in February Fasting readings are relatively better and need to be assessed more consistently Has had only minimal weight loss with Invokana  Hypertension and history of hyperkalemia: Controlled Urine microalbumin normal  PLAN:    He will double the dose of Invokana to 200 mg Start taking blood sugar readings after lunch He will need to make sure he takes his Humalog before breakfast consistently Also needs more blood sugar monitoring after dinner to help adjust his insulin dose Most likely needs 15 units at suppertime unless eating a small meal He will need to again consider the Omni pod pump if he gets coverage for this Follow-up in 3 months  There are no Patient Instructions on file for this visit.    Elayne Snare 07/04/2018, 9:15 PM   Note: This office note was prepared with Dragon voice recognition system technology. Any transcriptional errors that result from this process are unintentional.

## 2018-07-05 ENCOUNTER — Other Ambulatory Visit: Payer: Self-pay

## 2018-07-05 ENCOUNTER — Encounter: Payer: Self-pay | Admitting: Endocrinology

## 2018-07-05 ENCOUNTER — Ambulatory Visit (INDEPENDENT_AMBULATORY_CARE_PROVIDER_SITE_OTHER): Payer: 59 | Admitting: Endocrinology

## 2018-07-05 ENCOUNTER — Ambulatory Visit: Payer: Self-pay | Admitting: Endocrinology

## 2018-07-05 DIAGNOSIS — Z794 Long term (current) use of insulin: Secondary | ICD-10-CM | POA: Diagnosis not present

## 2018-07-05 DIAGNOSIS — E1165 Type 2 diabetes mellitus with hyperglycemia: Secondary | ICD-10-CM

## 2018-07-05 MED ORDER — CANAGLIFLOZIN 300 MG PO TABS: 300.0000 mg | ORAL_TABLET | Freq: Every day | ORAL | 3 refills | Status: DC

## 2018-08-03 ENCOUNTER — Other Ambulatory Visit: Payer: Self-pay | Admitting: Endocrinology

## 2018-08-10 LAB — HM DIABETES EYE EXAM

## 2018-09-05 ENCOUNTER — Other Ambulatory Visit: Payer: 59

## 2018-09-07 ENCOUNTER — Ambulatory Visit: Payer: 59 | Admitting: Endocrinology

## 2018-09-09 ENCOUNTER — Other Ambulatory Visit (INDEPENDENT_AMBULATORY_CARE_PROVIDER_SITE_OTHER): Payer: 59

## 2018-09-09 ENCOUNTER — Other Ambulatory Visit: Payer: Self-pay

## 2018-09-09 DIAGNOSIS — E1165 Type 2 diabetes mellitus with hyperglycemia: Secondary | ICD-10-CM

## 2018-09-09 DIAGNOSIS — Z794 Long term (current) use of insulin: Secondary | ICD-10-CM | POA: Diagnosis not present

## 2018-09-09 LAB — COMPREHENSIVE METABOLIC PANEL
ALT: 11 U/L (ref 0–53)
AST: 13 U/L (ref 0–37)
Albumin: 4 g/dL (ref 3.5–5.2)
Alkaline Phosphatase: 81 U/L (ref 39–117)
BUN: 14 mg/dL (ref 6–23)
CO2: 29 mEq/L (ref 19–32)
Calcium: 8.9 mg/dL (ref 8.4–10.5)
Chloride: 104 mEq/L (ref 96–112)
Creatinine, Ser: 0.79 mg/dL (ref 0.40–1.50)
GFR: 106.96 mL/min (ref >60.00)
Glucose, Bld: 117 mg/dL — ABNORMAL HIGH (ref 70–99)
Potassium: 4.4 mEq/L (ref 3.5–5.1)
Sodium: 140 mEq/L (ref 135–145)
Total Bilirubin: 0.5 mg/dL (ref 0.2–1.2)
Total Protein: 6.6 g/dL (ref 6.0–8.3)

## 2018-09-09 LAB — HEMOGLOBIN A1C: Hgb A1c MFr Bld: 9 % — ABNORMAL HIGH (ref 4.6–6.5)

## 2018-09-13 ENCOUNTER — Ambulatory Visit: Payer: 59 | Admitting: Endocrinology

## 2018-09-19 ENCOUNTER — Other Ambulatory Visit: Payer: Self-pay

## 2018-09-19 ENCOUNTER — Ambulatory Visit (INDEPENDENT_AMBULATORY_CARE_PROVIDER_SITE_OTHER): Payer: 59 | Admitting: Endocrinology

## 2018-09-19 ENCOUNTER — Encounter: Payer: Self-pay | Admitting: Endocrinology

## 2018-09-19 DIAGNOSIS — E782 Mixed hyperlipidemia: Secondary | ICD-10-CM

## 2018-09-19 DIAGNOSIS — E1165 Type 2 diabetes mellitus with hyperglycemia: Secondary | ICD-10-CM | POA: Diagnosis not present

## 2018-09-19 DIAGNOSIS — Z794 Long term (current) use of insulin: Secondary | ICD-10-CM

## 2018-09-19 NOTE — Progress Notes (Signed)
Patient ID: Todd Gaines, male   DOB: 14-Mar-1976, 43 y.o.   MRN: [id number]           Reason for Appointment: Follow-up for Type 2 Diabetes  Referring physician: [facility]   Today's office visit was provided via telemedicine using video technique Explained to the patient and the the limitations of evaluation and management by telemedicine and the availability of in person appointments.  The patient understood the limitations and agreed to proceed. Patient also understood that the telehealth visit is billable.  [place]  Location of the provider: Office Only the patient and myself were participating in the encounter    History of Present Illness:          Date of diagnosis of type 2 diabetes mellitus: 2012       Background history:   He believes her A1c was 12% when he was first diagnosed to have diabetes and was having symptoms of feeling very tired. Initially he made changes in his lifestyle and was able to lose some weight also He was probably treated with metformin and may have been given Tradjenta subsequently Previous records are not available He thinks his A1c had been around 7-7.5% for some time until last year He was started on basal insulin in 9/16 when his A1c was 9.9 He did not use the V-go pump because of lack of adequate insurance coverage and out-of-pocket expense, this was improving his control  Recent history:   INSULIN dosage: Basaglar 40 units daily in the evening Humalog before breakfast and dinner usually 10 units  Non-insulin hypoglycemic drugs: Metformin ER 2000 mg a day, Victoza 1.8 mg daily, Invokana 300 mg daily  His A1c is only slightly better at 9% compared to 9.4  Current blood sugar patterns and problems identified:   He is checking blood sugars mostly in the morning still and does not remember to check them otherwise  He thinks his blood sugar may be poorly controlled because of not eating healthy  meals especially when on vacation recently  Previously had stated that he cannot take his insulin pen at work because of being very active  However because of this he will sometimes miss his evening insulin if he is coming home late and will eat out  FASTING blood sugars are mostly fairly good  No hypoglycemia  His Invokana was increased to 300 mg but not clear if he has benefited from this  Also his recent weight was 278 and he is still tending to gain some weight despite taking Victoza and Invokana  He does not use the freestyle libre sensor because of the high cost out of pocket  Also he is not able to get the Omni pod pump because of cost   Side effects from medications have been: Transient nausea from Trulicity  Compliance with the medical regimen: Fair    Glucose monitoring:  0-1 times a day         Glucometer:  One Touch Blood Glucose readings by patient review of meter:   PRE-MEAL Fasting Lunch Dinner Bedtime Overall  Glucose range:  112-162   158  200+   Mean/median:     ?   Previous readings:  PRE-MEAL Fasting Lunch Dinner Bedtime Overall  Glucose range:  139, 209    98   Mean/median:        POST-MEAL PC Breakfast PC Lunch PC Dinner  Glucose range:  158-208 ?   194-290  Mean/median:        Self-care:  Typical meal intake: Breakfast is eggs, sausage sometimes, otherwise may have breakfast burrito  Snacks will be peanut butter crackers, chips, pretzels and peanut butter               Dietician visit, most recent: 10/16               Exercise: active at work   Weight history: 275-298 lbs  Wt Readings from Last 3 Encounters:  04/26/18 273 lb 12.8 oz (124.2 kg)  02/14/18 268 lb 12.8 oz (121.9 kg)  01/26/18 271 lb 8 oz (123.2 kg)    Glycemic control:   Lab Results  Component Value Date   HGBA1C 9.0 (H) 09/09/2018   HGBA1C 9.4 (H) 06/30/2018   HGBA1C 10.0 (H) 02/09/2018   Lab Results  Component Value Date   MICROALBUR 3.8 (H) 02/09/2018    LDLCALC 66 12/03/2017   CREATININE 0.79 09/09/2018    No visits with results within 1 Week(s) from this visit.  Latest known visit with results is:  Lab on 09/09/2018  Component Date Value Ref Range Status   Sodium 09/09/2018 140  135 - 145 mEq/L Final   Potassium 09/09/2018 4.4  3.5 - 5.1 mEq/L Final   Chloride 09/09/2018 104  96 - 112 mEq/L Final   CO2 09/09/2018 29  19 - 32 mEq/L Final   Glucose, Bld 09/09/2018 117* 70 - 99 mg/dL Final   BUN 09/09/2018 14  6 - 23 mg/dL Final   Creatinine, Ser 09/09/2018 0.79  0.40 - 1.50 mg/dL Final   Total Bilirubin 09/09/2018 0.5  0.2 - 1.2 mg/dL Final   Alkaline Phosphatase 09/09/2018 81  39 - 117 U/L Final   AST 09/09/2018 13  0 - 37 U/L Final   ALT 09/09/2018 11  0 - 53 U/L Final   Total Protein 09/09/2018 6.6  6.0 - 8.3 g/dL Final   Albumin 09/09/2018 4.0  3.5 - 5.2 g/dL Final   Calcium 09/09/2018 8.9  8.4 - 10.5 mg/dL Final   GFR 09/09/2018 106.96  >60.00 mL/min Final   Hgb A1c MFr Bld 09/09/2018 9.0* 4.6 - 6.5 % Final   Glycemic Control Guidelines for People with Diabetes:Non Diabetic:  <6%Goal of Therapy: <7%Additional Action Suggested:  >8%       Allergies as of 09/19/2018   No Known Allergies     Medication List       Accurate as of September 19, 2018 10:51 AM. If you have any questions, ask your nurse or doctor.        atorvastatin 10 MG tablet Commonly known as: LIPITOR Take 1 tablet (10 mg total) by mouth daily.   Basaglar KwikPen 100 UNIT/ML Sopn Inject 0.44 mLs (44 Units total) into the skin at bedtime. Inject 40 units under the skin at bedtime. What changed: how much to take   canagliflozin 300 MG Tabs tablet Commonly known as: Invokana Take 1 tablet (300 mg total) by mouth daily before breakfast.   glucose blood test strip Commonly known as: ONE TOUCH ULTRA TEST Use as instructed   insulin lispro 100 UNIT/ML KiwkPen Commonly known as: HumaLOG KwikPen 15-20 ac tid What changed:   when to take  this  additional instructions   Insulin Pen Needle 32G X 5 MM Misc Commonly known as: NovoTwist Use one per day to inject victoza   BD Pen Needle Nano U/F 32G X 4 MM Misc Generic drug: Insulin Pen Needle USE  TO INJECT INSULIN ONCE A DAY   lisinopril 10 MG tablet Commonly known as: ZESTRIL Take 1 tablet (10 mg total) by mouth daily.   metFORMIN 500 MG 24 hr tablet Commonly known as: GLUCOPHAGE-XR TAKE FOUR TABLETS BY MOUTH DAILY   ONE TOUCH ULTRA 2 w/Device Kit Check fasting blood sugar and 2 hour post prandial BS in afternoon or evening. E11.9.   OneTouch Delica Lancets 75T Misc CHECK FASTING BLOOD SUGAR AND 2 HOUR POST PRANDIAL IN AFTERNOON OR EVENING.   STELARA [place] Inject into the skin.   Victoza 18 MG/3ML Sopn Generic drug: liraglutide Inject 0.2 mLs (1.2 mg total) into the skin daily. Inject once daily at the same time What changed: how much to take       Allergies: No Known Allergies  Past Medical History:  Diagnosis Date   Diabetes mellitus without complication ([facility])    Elevated cholesterol    Psoriasis     History reviewed. No pertinent surgical history.  Family History  Problem Relation Age of Onset   Diabetes Mother    Diabetes Paternal Grandfather    Hypertension Paternal Grandfather    Heart disease Neg Hx    Cancer Neg Hx     Social History:  reports that he has quit smoking. He has never used smokeless tobacco. He reports that he does not drink alcohol or use drugs.    Review of Systems    Lipid history: On treatment with Lipitor 10 mg for long-term with adequate control Has persistently low HDL   Lab Results  Component Value Date   CHOL 117 12/03/2017   HDL 27.60 (L) 12/03/2017   LDLCALC 66 12/03/2017   LDLDIRECT 89.0 06/23/2017   TRIG 119.0 12/03/2017   CHOLHDL 4 12/03/2017             Blood pressure previously was treated with lisinopril 10 mg but this was stopped with starting Invokana He will occasionally check  blood pressure at the drugstore and is normal  No hyperkalemia with Invokana:  Lab Results  Component Value Date   K 4.4 09/09/2018       LABS:  No visits with results within 1 Week(s) from this visit.  Latest known visit with results is:  Lab on 09/09/2018  Component Date Value Ref Range Status   Sodium 09/09/2018 140  135 - 145 mEq/L Final   Potassium 09/09/2018 4.4  3.5 - 5.1 mEq/L Final   Chloride 09/09/2018 104  96 - 112 mEq/L Final   CO2 09/09/2018 29  19 - 32 mEq/L Final   Glucose, Bld 09/09/2018 117* 70 - 99 mg/dL Final   BUN 09/09/2018 14  6 - 23 mg/dL Final   Creatinine, Ser 09/09/2018 0.79  0.40 - 1.50 mg/dL Final   Total Bilirubin 09/09/2018 0.5  0.2 - 1.2 mg/dL Final   Alkaline Phosphatase 09/09/2018 81  39 - 117 U/L Final   AST 09/09/2018 13  0 - 37 U/L Final   ALT 09/09/2018 11  0 - 53 U/L Final   Total Protein 09/09/2018 6.6  6.0 - 8.3 g/dL Final   Albumin 09/09/2018 4.0  3.5 - 5.2 g/dL Final   Calcium 09/09/2018 8.9  8.4 - 10.5 mg/dL Final   GFR 09/09/2018 106.96  >60.00 mL/min Final   Hgb A1c MFr Bld 09/09/2018 9.0* 4.6 - 6.5 % Final   Glycemic Control Guidelines for People with Diabetes:Non Diabetic:  <6%Goal of Therapy: <7%Additional Action Suggested:  >8%     Physical  Examination:  There were no vitals taken for this visit.       ASSESSMENT:  Diabetes type 2, uncontrolled with obesity  See history of present illness for detailed discussion of his current management, blood sugar patterns and problems identified  His A1c has been persistently high and only slightly better at 9 although has been as high as 10%  He is showing signs of progressive insulin deficiency and needing basal bolus insulin regimen for control  He is likely having postprandial hyperglycemia as discussed previously with lack of mealtime insulin consistently especially at lunch and dinner He thinks he cannot keep his Humalog on his person because of being very  active at work but has not thought about keeping the Humalog in a cooler in his truck Discussed that he does need to get better postprandial blood sugars to improve his A1c and this will be possible only if he is improving his diet consistently and taking Humalog appropriately at mealtimes Also with lack of adequate monitoring after meals difficult to know how much Humalog he needs Fasting blood sugars appear to be fairly good with current dose of Basaglar   PLAN:    He will start carrying his Humalog with him in a small cooler in his truck so that he can take the doses at lunch and dinner consistently before eating every meal However he will start checking blood sugars consistently 2 to 3 hours after meals especially breakfast lunch since he has no adjustment of his Humalog made based on blood sugar patterns Most likely for larger meals or eating out he will take 15 of Humalog in the evening since he appears to have rather high readings after evening meal by history As before he was instructed on cutting back on his high fat high carbohydrate meals especially in the evenings and avoid any further weight gain Discussed blood sugar targets after meals Again if able to afford the CGM on insulin pump he will try to switch Also may be able to consider the V-go pump Continue non-insulin drugs unchanged Follow-up in 3 months  Counseling time on subjects discussed in assessment and plan sections is over 50% of today's 25 minute visit   There are no Patient Instructions on file for this visit.    Elayne Snare 09/19/2018, 10:51 AM   Note: This office note was prepared with Dragon voice recognition system technology. Any transcriptional errors that result from this process are unintentional.

## 2018-10-31 ENCOUNTER — Other Ambulatory Visit: Payer: Self-pay | Admitting: Family Medicine

## 2018-10-31 ENCOUNTER — Other Ambulatory Visit: Payer: Self-pay

## 2018-11-01 ENCOUNTER — Telehealth: Payer: Self-pay | Admitting: Endocrinology

## 2018-11-01 ENCOUNTER — Other Ambulatory Visit: Payer: Self-pay

## 2018-11-01 MED ORDER — METFORMIN HCL ER 500 MG PO TB24: 2000.0000 mg | ORAL_TABLET | Freq: Every day | ORAL | 0 refills | Status: DC

## 2018-11-01 MED ORDER — ONETOUCH ULTRA BLUE VI STRP: ORAL_STRIP | 0 refills | Status: DC

## 2018-11-01 MED ORDER — ONETOUCH ULTRA 2 W/DEVICE KIT: PACK | 0 refills | Status: DC

## 2018-11-01 NOTE — Telephone Encounter (Signed)
Rx sent 

## 2018-11-01 NOTE — Telephone Encounter (Signed)
MEDICATION: One Touch Ultra 2 Meter  PHARMACY:  [id number]  IS THIS A 90 DAY SUPPLY :   IS PATIENT OUT OF MEDICATION:   IF NOT; HOW MUCH IS LEFT:   LAST APPOINTMENT DATE: @8 /17/2020  NEXT APPOINTMENT DATE:@10 /04/2018  DO WE HAVE YOUR PERMISSION TO LEAVE A DETAILED MESSAGE:  OTHER COMMENTS:  Lost  **Let patient know to contact pharmacy at the end of the day to make sure medication is ready. **  ** Please notify patient to allow 48-72 hours to process**  **Encourage patient to contact the pharmacy for refills or they can request refills through [facility]**

## 2018-11-10 ENCOUNTER — Other Ambulatory Visit: Payer: Self-pay | Admitting: Endocrinology

## 2018-12-16 ENCOUNTER — Other Ambulatory Visit: Payer: Self-pay

## 2018-12-16 ENCOUNTER — Other Ambulatory Visit (INDEPENDENT_AMBULATORY_CARE_PROVIDER_SITE_OTHER): Payer: 59

## 2018-12-16 DIAGNOSIS — E1165 Type 2 diabetes mellitus with hyperglycemia: Secondary | ICD-10-CM | POA: Diagnosis not present

## 2018-12-16 DIAGNOSIS — E782 Mixed hyperlipidemia: Secondary | ICD-10-CM

## 2018-12-16 DIAGNOSIS — Z794 Long term (current) use of insulin: Secondary | ICD-10-CM | POA: Diagnosis not present

## 2018-12-16 LAB — COMPREHENSIVE METABOLIC PANEL
ALT: 11 U/L (ref 0–53)
AST: 12 U/L (ref 0–37)
Albumin: 4.2 g/dL (ref 3.5–5.2)
Alkaline Phosphatase: 91 U/L (ref 39–117)
BUN: 13 mg/dL (ref 6–23)
CO2: 30 mEq/L (ref 19–32)
Calcium: 9.6 mg/dL (ref 8.4–10.5)
Chloride: 102 mEq/L (ref 96–112)
Creatinine, Ser: 0.86 mg/dL (ref 0.40–1.50)
GFR: 96.85 mL/min (ref >60.00)
Glucose, Bld: 112 mg/dL — ABNORMAL HIGH (ref 70–99)
Potassium: 4.3 mEq/L (ref 3.5–5.1)
Sodium: 138 mEq/L (ref 135–145)
Total Bilirubin: 0.5 mg/dL (ref 0.2–1.2)
Total Protein: 7 g/dL (ref 6.0–8.3)

## 2018-12-16 LAB — LIPID PANEL
Cholesterol: 125 mg/dL (ref 0–200)
HDL: 29.1 mg/dL — ABNORMAL LOW (ref >39.00)
LDL Cholesterol: 79 mg/dL (ref 0–99)
NonHDL: 95.77
Total CHOL/HDL Ratio: 4
Triglycerides: 84 mg/dL (ref 0.0–149.0)
VLDL: 16.8 mg/dL (ref 0.0–40.0)

## 2018-12-16 LAB — MICROALBUMIN / CREATININE URINE RATIO
Creatinine,U: 40.4 mg/dL
Microalb Creat Ratio: 1.7 mg/g (ref 0.0–30.0)
Microalb, Ur: <0.7 mg/dL (ref 0.0–1.9)

## 2018-12-16 LAB — HEMOGLOBIN A1C: Hgb A1c MFr Bld: 9.5 % — ABNORMAL HIGH (ref 4.6–6.5)

## 2018-12-23 ENCOUNTER — Encounter: Payer: Self-pay | Admitting: Endocrinology

## 2018-12-23 ENCOUNTER — Ambulatory Visit (INDEPENDENT_AMBULATORY_CARE_PROVIDER_SITE_OTHER): Payer: 59 | Admitting: Endocrinology

## 2018-12-23 ENCOUNTER — Other Ambulatory Visit: Payer: Self-pay

## 2018-12-23 DIAGNOSIS — E1165 Type 2 diabetes mellitus with hyperglycemia: Secondary | ICD-10-CM

## 2018-12-23 DIAGNOSIS — Z794 Long term (current) use of insulin: Secondary | ICD-10-CM | POA: Diagnosis not present

## 2018-12-23 DIAGNOSIS — E782 Mixed hyperlipidemia: Secondary | ICD-10-CM

## 2018-12-23 DIAGNOSIS — Z6836 Body mass index (BMI) 36.0-36.9, adult: Secondary | ICD-10-CM | POA: Diagnosis not present

## 2018-12-23 MED ORDER — LANTUS SOLOSTAR 100 UNIT/ML ~~LOC~~ SOPN: 40.0000 [IU] | PEN_INJECTOR | Freq: Every day | SUBCUTANEOUS | 2 refills | Status: DC

## 2018-12-23 NOTE — Progress Notes (Signed)
Patient ID: Todd Gaines, male   DOB: 1975-10-30, 43 y.o.   MRN: [id number]           Reason for Appointment: Follow-up for Type 2 Diabetes  Referring physician: [facility]   Today's office visit was provided via telemedicine using video technique Explained to the patient and the the limitations of evaluation and management by telemedicine and the availability of in person appointments.  The patient understood the limitations and agreed to proceed. Patient also understood that the telehealth visit is billable.  [place] the [place]  Location of the provider: Office Only the patient and myself were participating in the encounter    History of Present Illness:          Date of diagnosis of type 2 diabetes mellitus: 2012       Background history:   He believes her A1c was 12% when he was first diagnosed to have diabetes and was having symptoms of feeling very tired. Initially he made changes in his lifestyle and was able to lose some weight also He was probably treated with metformin and may have been given Tradjenta subsequently Previous records are not available He thinks his A1c had been around 7-7.5% for some time until last year He was started on basal insulin in 9/16 when his A1c was 9.9 He did not use the V-go pump because of lack of adequate insurance coverage and out-of-pocket expense, this was improving his control  Recent history:   INSULIN dosage: Basaglar 40 units daily in the evening Humalog before   dinner usually 10-12 units  Non-insulin hypoglycemic drugs: Metformin ER 2000 mg a day, Victoza 1.8 mg daily, Invokana 300 mg daily  His A1c is only slightly better at 9% compared to 9.4  Current blood sugar patterns and problems identified:   He is still not taking his Humalog with his lunch and also not at breakfast despite discussion on the previous visit  He has started checking blood sugars in the evenings either before or after  dinner  Most of these are around 200 or higher  Despite this he has not increased his Humalog  He also thinks that his blood sugars are higher from fast food, poor meal planning and not restricting sweets or junk food  In the morning he is eating toast but not taking any insulin for this  Not clear if he has any high readings after breakfast or lunch  He thinks his weight is down to 260, this appears to be better  Has been consistent with Victoza and Invokana  He is now switched to Lantus which is preferred  However he says that he will sometimes take only 35 Basaglar at night and not clear why  FASTING blood sugars are mostly fairly good with a couple of high readings  He cannot afford the co-pay for the freestyle libre and has not checked again about the Omni pod  He does a lot of walking at work   Side effects from medications have been: Transient nausea from Trulicity  Compliance with the medical regimen: Fair    Glucose monitoring:  0-1 times a day         Glucometer:  One Touch Blood Glucose readings by patient review of meter:   PRE-MEAL Fasting Lunch Dinner Bedtime Overall  Glucose range:  121-172   194-242    Mean/median:     ?   POST-MEAL PC Breakfast PC Lunch PC Dinner  Glucose  range:    198-274  Mean/median:      Previous readings:  PRE-MEAL Fasting Lunch Dinner Bedtime Overall  Glucose range:  112-162   158  200+   Mean/median:     ?     Self-care:  Typical meal intake: Breakfast is eggs, sausage sometimes, otherwise may have breakfast burrito  Snacks will be peanut butter crackers, chips, pretzels and peanut butter               Dietician visit, most recent: 10/16               Exercise: active at work   Weight history: 275-298 lbs  Wt Readings from Last 3 Encounters:  04/26/18 273 lb 12.8 oz (124.2 kg)  02/14/18 268 lb 12.8 oz (121.9 kg)  01/26/18 271 lb 8 oz (123.2 kg)    Glycemic control:   Lab Results  Component Value Date    HGBA1C 9.5 (H) 12/16/2018   HGBA1C 9.0 (H) 09/09/2018   HGBA1C 9.4 (H) 06/30/2018   Lab Results  Component Value Date   MICROALBUR <0.7 12/16/2018   [place] 79 12/16/2018   CREATININE 0.86 12/16/2018    No visits with results within 1 Week(s) from this visit.  Latest known visit with results is:  Lab on 12/16/2018  Component Date Value Ref Range Status   Microalb, Ur 12/16/2018 <0.7  0.0 - 1.9 mg/dL Final   Creatinine,U 12/16/2018 40.4  mg/dL Final   Microalb Creat Ratio 12/16/2018 1.7  0.0 - 30.0 mg/g Final   Cholesterol 12/16/2018 125  0 - 200 mg/dL Final   ATP III Classification       Desirable:  < 200 mg/dL               Borderline High:  200 - 239 mg/dL          High:  > = 240 mg/dL   Triglycerides 12/16/2018 84.0  0.0 - 149.0 mg/dL Final   Normal:  <150 mg/dLBorderline High:  150 - 199 mg/dL   HDL 12/16/2018 29.10* >39.00 mg/dL Final   VLDL 12/16/2018 16.8  0.0 - 40.0 mg/dL Final   LDL Cholesterol 12/16/2018 79  0 - 99 mg/dL Final   Total CHOL/HDL Ratio 12/16/2018 4   Final                  Men          Women1/2 Average Risk     3.4          3.3Average Risk          5.0          4.42X Average Risk          9.6          7.13X Average Risk          15.0          11.0                       NonHDL 12/16/2018 95.77   Final   NOTE:  Non-HDL goal should be 30 mg/dL higher than patient's LDL goal (i.e. LDL goal of < 70 mg/dL, would have non-HDL goal of < 100 mg/dL)   Sodium 12/16/2018 138  135 - 145 mEq/L Final   Potassium 12/16/2018 4.3  3.5 - 5.1 mEq/L Final   Chloride 12/16/2018 102  96 - 112 mEq/L Final   CO2 12/16/2018 30  19 - 32  mEq/L Final   Glucose, Bld 12/16/2018 112* 70 - 99 mg/dL Final   BUN 12/16/2018 13  6 - 23 mg/dL Final   Creatinine, Ser 12/16/2018 0.86  0.40 - 1.50 mg/dL Final   Total Bilirubin 12/16/2018 0.5  0.2 - 1.2 mg/dL Final   Alkaline Phosphatase 12/16/2018 91  39 - 117 U/L Final   AST 12/16/2018 12  0 - 37 U/L Final   ALT 12/16/2018  11  0 - 53 U/L Final   Total Protein 12/16/2018 7.0  6.0 - 8.3 g/dL Final   Albumin 12/16/2018 4.2  3.5 - 5.2 g/dL Final   Calcium 12/16/2018 9.6  8.4 - 10.5 mg/dL Final   GFR 12/16/2018 96.85  >60.00 mL/min Final   Hgb A1c MFr Bld 12/16/2018 9.5* 4.6 - 6.5 % Final   Glycemic Control Guidelines for People with Diabetes:Non Diabetic:  <6%Goal of Therapy: <7%Additional Action Suggested:  >8%       Allergies as of 12/23/2018   No Known Allergies     Medication List       Accurate as of December 23, 2018 10:21 AM. If you have any questions, ask your nurse or doctor.        atorvastatin 10 MG tablet Commonly known as: LIPITOR Take 1 tablet (10 mg total) by mouth daily.   Basaglar KwikPen 100 UNIT/ML Sopn Inject 40 units under the skin at bedtime.   canagliflozin 300 MG Tabs tablet Commonly known as: Invokana Take 1 tablet (300 mg total) by mouth daily before breakfast.   insulin lispro 100 UNIT/ML KiwkPen Commonly known as: HumaLOG KwikPen 15-20 ac tid What changed:   when to take this  additional instructions   Insulin Pen Needle 32G X 5 MM Misc Commonly known as: NovoTwist Use one per day to inject victoza   BD Pen Needle Nano U/F 32G X 4 MM Misc Generic drug: Insulin Pen Needle USE TO INJECT INSULIN ONCE A DAY   lisinopril 10 MG tablet Commonly known as: ZESTRIL Take 1 tablet (10 mg total) by mouth daily.   metFORMIN 500 MG 24 hr tablet Commonly known as: GLUCOPHAGE-XR Take 4 tablets (2,000 mg total) by mouth daily. What changed:   how much to take  additional instructions   ONE TOUCH ULTRA 2 w/Device Kit Check fasting blood sugar and 2 hour post prandial BS in afternoon or evening. E11.9.   ONE TOUCH ULTRA TEST test strip Generic drug: glucose blood Use as instructed   OneTouch Delica Lancets 56D Misc CHECK FASTING BLOOD SUGAR AND 2 HOUR POST PRANDIAL IN AFTERNOON OR EVENING.   STELARA Mercer Inject into the skin.   Victoza 18 MG/3ML  Sopn Generic drug: liraglutide Inject 0.2 mLs (1.2 mg total) into the skin daily. Inject once daily at the same time What changed: how much to take       Allergies: No Known Allergies  Past Medical History:  Diagnosis Date   Diabetes mellitus without complication (HCC)    Elevated cholesterol    Psoriasis     History reviewed. No pertinent surgical history.  Family History  Problem Relation Age of Onset   Diabetes Mother    Diabetes Paternal Grandfather    Hypertension Paternal Grandfather    Heart disease Neg Hx    Cancer Neg Hx     Social History:  reports that he has quit smoking. He has never used smokeless tobacco. He reports that he does not drink alcohol or use drugs.    Review  of Systems    Lipid history: On treatment with Lipitor 10 mg for long-term with adequate control Has persistently low HDL   Lab Results  Component Value Date   CHOL 125 12/16/2018   HDL 29.10 (L) 12/16/2018   LDLCALC 79 12/16/2018   LDLDIRECT 89.0 06/23/2017   TRIG 84.0 12/16/2018   CHOLHDL 4 12/16/2018             Blood pressure previously was treated with lisinopril 10 mg but this was stopped with starting Invokana He does occasionally check his blood pressure at drugstore  BP Readings from Last 3 Encounters:  04/26/18 118/83  02/14/18 132/82  01/26/18 116/70    Lab Results  Component Value Date   CREATININE 0.86 12/16/2018   CREATININE 0.79 09/09/2018   CREATININE 0.92 06/30/2018        LABS:  No visits with results within 1 Week(s) from this visit.  Latest known visit with results is:  Lab on 12/16/2018  Component Date Value Ref Range Status   Microalb, Ur 12/16/2018 <0.7  0.0 - 1.9 mg/dL Final   Creatinine,U 12/16/2018 40.4  mg/dL Final   Microalb Creat Ratio 12/16/2018 1.7  0.0 - 30.0 mg/g Final   Cholesterol 12/16/2018 125  0 - 200 mg/dL Final   ATP III Classification       Desirable:  < 200 mg/dL               Borderline High:  200 - 239  mg/dL          High:  > = 240 mg/dL   Triglycerides 12/16/2018 84.0  0.0 - 149.0 mg/dL Final   Normal:  <150 mg/dLBorderline High:  150 - 199 mg/dL   HDL 12/16/2018 29.10* >39.00 mg/dL Final   VLDL 12/16/2018 16.8  0.0 - 40.0 mg/dL Final   LDL Cholesterol 12/16/2018 79  0 - 99 mg/dL Final   Total CHOL/HDL Ratio 12/16/2018 4   Final                  Men          Women1/2 Average Risk     3.4          3.3Average Risk          5.0          4.42X Average Risk          9.6          7.13X Average Risk          15.0          11.0                       NonHDL 12/16/2018 95.77   Final   NOTE:  Non-HDL goal should be 30 mg/dL higher than patient's LDL goal (i.e. LDL goal of < 70 mg/dL, would have non-HDL goal of < 100 mg/dL)   Sodium 12/16/2018 138  135 - 145 mEq/L Final   Potassium 12/16/2018 4.3  3.5 - 5.1 mEq/L Final   Chloride 12/16/2018 102  96 - 112 mEq/L Final   CO2 12/16/2018 30  19 - 32 mEq/L Final   Glucose, Bld 12/16/2018 112* 70 - 99 mg/dL Final   BUN 12/16/2018 13  6 - 23 mg/dL Final   Creatinine, Ser 12/16/2018 0.86  0.40 - 1.50 mg/dL Final   Total Bilirubin 12/16/2018 0.5  0.2 - 1.2 mg/dL Final   Alkaline Phosphatase 12/16/2018 91  39 - 117 U/L Final   AST 12/16/2018 12  0 - 37 U/L Final   ALT 12/16/2018 11  0 - 53 U/L Final   Total Protein 12/16/2018 7.0  6.0 - 8.3 g/dL Final   Albumin 12/16/2018 4.2  3.5 - 5.2 g/dL Final   Calcium 12/16/2018 9.6  8.4 - 10.5 mg/dL Final   GFR 12/16/2018 96.85  >60.00 mL/min Final   Hgb A1c MFr Bld 12/16/2018 9.5* 4.6 - 6.5 % Final   Glycemic Control Guidelines for People with Diabetes:Non Diabetic:  <6%Goal of Therapy: <7%Additional Action Suggested:  >8%     Physical Examination:  There were no vitals taken for this visit.       ASSESSMENT:  Diabetes type 2, uncontrolled with obesity  See history of present illness for detailed discussion of his current management, blood sugar patterns and problems identified  His  A1c has been persistently high and now 9.5  As before he has postprandial hyperglycemia He still does not understand the need to take consistent Humalog doses with every meal and increase the doses to get his readings under control His diet can also be apparently significantly better at lunch and dinner  As before he has difficulty affording CGM and pumps which would otherwise help him with his control  LIPIDS: Well controlled   PLAN:    He will start carrying his Humalog with him in his pocket since it is not hot anymore He likely needs 10 to 12 units at lunch but he will need to increase it if blood sugars are still not down to 130 or below at dinnertime We will do at least 14 units Humalog at dinnertime and increase the dose until readings after dinner are under 180 He will take at least 5 units of Humalog at breakfast since he is having some carbohydrate in the morning also Discussed importance of mealtime insulin coverage at home that this is going to help his postprandial control and get his A1c down  Switch Basaglar to the morning and take 40 units every day consistently, okay to take Lantus instead Discussed that this is tolerated to the fasting reading He will need to start improving his diet with better choices especially when eating out He will check into the Dexcom and Omni pod To check more readings after breakfast and lunch at least on his days off  Follow-up in 3 months    There are no Patient Instructions on file for this visit.  Counseling time on subjects discussed in assessment and plan sections is over 50% of today's 25 minute visit   Elayne Snare 12/23/2018, 10:21 AM   Note: This office note was prepared with Dragon voice recognition system technology. Any transcriptional errors that result from this process are unintentional.

## 2019-02-20 ENCOUNTER — Other Ambulatory Visit: Payer: Self-pay | Admitting: Endocrinology

## 2019-02-20 ENCOUNTER — Other Ambulatory Visit: Payer: Self-pay | Admitting: Family Medicine

## 2019-02-27 ENCOUNTER — Other Ambulatory Visit: Payer: Self-pay | Admitting: Endocrinology

## 2019-04-02 ENCOUNTER — Other Ambulatory Visit: Payer: Self-pay | Admitting: Family Medicine

## 2019-04-03 NOTE — Telephone Encounter (Signed)
Pt hasn't been seen in over a year and no future appts., please advise  

## 2019-04-03 NOTE — Telephone Encounter (Signed)
Please schedule a f/u (spring is fine) and refill until then

## 2019-04-04 NOTE — Telephone Encounter (Signed)
Med refilled once and Carrie will reach out to pt and try and get appt scheduled  

## 2019-04-13 ENCOUNTER — Other Ambulatory Visit: Payer: Self-pay | Admitting: Endocrinology

## 2019-04-18 ENCOUNTER — Telehealth: Payer: Self-pay

## 2019-04-18 NOTE — Telephone Encounter (Signed)
Received fax from [facility] stating that this PA has been canceled because it was previously approved from 03/17/2019 to 06/13/2021. [id number]

## 2019-04-18 NOTE — Telephone Encounter (Signed)
PA initiated via [email or web address] for Victoza 1.8mg  daily injections.  Todd Gaines (Key: [id number]) Rx #: [id number] Victoza 18MG pen-injectors   Form [facility] Electronic Prior Authorization Form (2017 [employer]CP[employer]) Created 3 days ago Sent to Plan 2 minutes ago Plan Response 1 minute ago Submit Clinical Questions less than a minute ago Determination Wait for Determination Please wait for [facility] 2017 [employer]CP[employer] to return a determination.

## 2019-05-08 ENCOUNTER — Other Ambulatory Visit: Payer: Self-pay | Admitting: Endocrinology

## 2019-05-10 ENCOUNTER — Other Ambulatory Visit: Payer: Self-pay | Admitting: Family Medicine

## 2019-06-07 ENCOUNTER — Other Ambulatory Visit: Payer: Self-pay | Admitting: Endocrinology

## 2019-06-09 ENCOUNTER — Other Ambulatory Visit: Payer: 59

## 2019-06-15 ENCOUNTER — Ambulatory Visit: Payer: 59 | Admitting: Endocrinology

## 2019-06-27 ENCOUNTER — Other Ambulatory Visit: Payer: Self-pay

## 2019-06-27 ENCOUNTER — Ambulatory Visit (INDEPENDENT_AMBULATORY_CARE_PROVIDER_SITE_OTHER): Payer: 59 | Admitting: Family Medicine

## 2019-06-27 ENCOUNTER — Encounter: Payer: Self-pay | Admitting: Family Medicine

## 2019-06-27 VITALS — BP 134/80 | HR 90 | Temp 97.8°F | Ht 72.5 in | Wt 266.4 lb

## 2019-06-27 DIAGNOSIS — E1165 Type 2 diabetes mellitus with hyperglycemia: Secondary | ICD-10-CM | POA: Diagnosis not present

## 2019-06-27 DIAGNOSIS — E1169 Type 2 diabetes mellitus with other specified complication: Secondary | ICD-10-CM | POA: Diagnosis not present

## 2019-06-27 DIAGNOSIS — I10 Essential (primary) hypertension: Secondary | ICD-10-CM

## 2019-06-27 DIAGNOSIS — E66812 Obesity, class 2: Secondary | ICD-10-CM

## 2019-06-27 DIAGNOSIS — Z6835 Body mass index (BMI) 35.0-35.9, adult: Secondary | ICD-10-CM

## 2019-06-27 DIAGNOSIS — E785 Hyperlipidemia, unspecified: Secondary | ICD-10-CM

## 2019-06-27 DIAGNOSIS — R41840 Attention and concentration deficit: Secondary | ICD-10-CM

## 2019-06-27 MED ORDER — ATORVASTATIN CALCIUM 10 MG PO TABS: 10.0000 mg | ORAL_TABLET | Freq: Every day | ORAL | 11 refills | Status: DC

## 2019-06-27 NOTE — Assessment & Plan Note (Signed)
Lifelong but now causing problems with work  Manufacturing systems engineer complete tasks  Difficulty with on the job training  Mind wanders /scatter brained and daydreams Child has ADD He would like testing Denies s/s of dep or anxiety  Ref done to psychology for testing Will review results and discuss tx in the future Recommend exercise/good self care and good org skills

## 2019-06-27 NOTE — Assessment & Plan Note (Signed)
Discussed how this problem influences overall health and the risks it imposes  °Reviewed plan for weight loss with lower calorie diet (via better food choices and also portion control or program like weight watchers) and exercise building up to or more than 30 minutes 5 days per week including some aerobic activity  ° °Commended wt loss so far  °

## 2019-06-27 NOTE — Assessment & Plan Note (Signed)
bp in fair control at this time  BP Readings from Last 1 Encounters:  06/27/19 134/80   No changes needed Most recent labs reviewed  Disc lifstyle change with low sodium diet and exercise  Per pt no longer on lisinopril

## 2019-06-27 NOTE — Progress Notes (Signed)
Subjective:    Patient ID: Todd Gaines, male    DOB: 02/29/1976, 44 y.o.   MRN: [id number]  This visit occurred during the SARS-CoV-2 public health emergency.  Safety protocols were in place, including screening questions prior to the visit, additional usage of staff PPE, and extensive cleaning of exam room while observing appropriate contact time as indicated for disinfecting solutions.    HPI Pt presents for f/u of chronic health problems   Wt Readings from Last 3 Encounters:  06/27/19 266 lb 7 oz (120.9 kg)  04/26/18 273 lb 12.8 oz (124.2 kg)  02/14/18 268 lb 12.8 oz (121.9 kg)   35.64 kg/m   Had managed to keep working throughout the pandemic  Had his covid immunizations (moderna)   Coaching travel softball- active in general  A little bit of weight lifting     In general feels pretty good  More tired the past 6-7 months  Some loss of motivation but not depressed at all   Libido is fine  Does not suspect low testosterone    Does not snore (used to when 300 lb)   bp is stable today  No cp or palpitations or headaches or edema  No side effects to medicines  BP Readings from Last 3 Encounters:  06/27/19 134/80  04/26/18 118/83  02/14/18 132/82     Pulse Readings from Last 3 Encounters:  06/27/19 90  04/26/18 (!) 106  02/14/18 97   is off lisinopril from endocrinology   DM2 Lab Results  Component Value Date   HGBA1C 9.5 (H) 12/16/2018   Lab Results  Component Value Date   MICROALBUR <0.7 12/16/2018   MICROALBUR 3.8 (H) 02/09/2018   victoza Metformin  Insulin   Cheating more on diet lately  Eats more in the winter   Sees endocrinology -Dr Dwyane Dee Last eye exam - thinks December   Hyperlipidemia  Lab Results  Component Value Date   CHOL 125 12/16/2018   HDL 29.10 (L) 12/16/2018   LDLCALC 79 12/16/2018   LDLDIRECT 89.0 06/23/2017   TRIG 84.0 12/16/2018   CHOLHDL 4 12/16/2018   Taking atorvastatin 10 mg daily   Struggling with  attentiveness  Cannot stay on task  Misplacing things  Day dreams  Struggled in school Doing more on the job training  Child with ADD He wants to be tested   Patient Active Problem List   Diagnosis Date Noted  . Routine general medical examination at a health care facility 01/26/2018  . Screening for HIV without presence of risk factors 06/23/2017  . Attention or concentration deficit 04/08/2016  . Essential hypertension, benign 03/05/2015  . Obesity 04/19/2014  . Diabetes mellitus type 2, uncontrolled ([place]) 04/18/2014  . Hyperlipidemia associated with type 2 diabetes mellitus ([place]) 04/18/2014  . Psoriasis 04/18/2014   Past Medical History:  Diagnosis Date  . Diabetes mellitus without complication ([place])   . Elevated cholesterol   . Psoriasis    History reviewed. No pertinent surgical history. Social History   Tobacco Use  . Smoking status: Former Research scientist (life sciences)  . Smokeless tobacco: Never Used  Substance Use Topics  . Alcohol use: No    Alcohol/week: 0.0 standard drinks  . Drug use: No   Family History  Problem Relation Age of Onset  . Diabetes Mother   . Diabetes Paternal Grandfather   . Hypertension Paternal Grandfather   . Heart disease Neg Hx   . Cancer Neg Hx    No Known Allergies Current Outpatient  Medications on File Prior to Visit  Medication Sig Dispense Refill  . BD PEN NEEDLE NANO U/F 32G X 4 MM MISC USE TO INJECT INSULIN ONCE A DAY 100 each 1  . Blood Glucose Monitoring Suppl (ONE TOUCH ULTRA 2) w/Device KIT Check fasting blood sugar and 2 hour post prandial BS in afternoon or evening. E11.9. 1 kit 0  . glucose blood (ONE TOUCH ULTRA TEST) test strip Use as instructed 100 each 0  . Insulin Glargine (LANTUS SOLOSTAR) 100 UNIT/ML Solostar Pen Inject 40 Units into the skin daily. Replacing Basaglar 5 pen 2  . insulin lispro (HUMALOG KWIKPEN) 100 UNIT/ML KiwkPen 15-20 ac tid (Patient taking differently: 2 (two) times daily. Inject 10-14 units under the skin before  supper.) 15 mL 1  . Insulin Pen Needle (NOVOTWIST) 32G X 5 MM MISC Use one per day to inject victoza 50 each 3  . INVOKANA 300 MG TABS tablet TAKE ONE TABLET BY MOUTH DAILY BEFORE BREAKFAST 30 tablet 2  . metFORMIN (GLUCOPHAGE-XR) 500 MG 24 hr tablet TAKE FOUR TABLETS BY MOUTH DAILY 120 tablet 0  . ONETOUCH DELICA LANCETS 36O MISC CHECK FASTING BLOOD SUGAR AND 2 HOUR POST PRANDIAL IN AFTERNOON OR EVENING. 100 each 1  . Ustekinumab (STELARA [place]) Inject into the skin.    Marland Kitchen VICTOZA 18 MG/3ML SOPN Inject 1.'8mg'$  unde the skin once daily at the same time. 9 mL 2   No current facility-administered medications on file prior to visit.     Review of Systems  Constitutional: Negative for activity change, appetite change, fatigue, fever and unexpected weight change.  HENT: Negative for congestion, rhinorrhea, sore throat and trouble swallowing.   Eyes: Negative for pain, redness, itching and visual disturbance.  Respiratory: Negative for cough, chest tightness, shortness of breath and wheezing.   Cardiovascular: Negative for chest pain and palpitations.  Gastrointestinal: Negative for abdominal pain, blood in stool, constipation, diarrhea and nausea.  Endocrine: Negative for cold intolerance, heat intolerance, polydipsia and polyuria.  Genitourinary: Negative for difficulty urinating, dysuria, frequency and urgency.  Musculoskeletal: Negative for arthralgias, joint swelling and myalgias.  Skin: Negative for pallor and rash.  Neurological: Negative for dizziness, tremors, weakness, numbness and headaches.  Hematological: Negative for adenopathy. Does not bruise/bleed easily.  Psychiatric/Behavioral: Positive for decreased concentration. Negative for dysphoric mood. The patient is not nervous/anxious.        Objective:   Physical Exam Constitutional:      General: He is not in acute distress.    Appearance: Normal appearance. He is well-developed. He is obese. He is not ill-appearing.  HENT:      Head: Normocephalic and atraumatic.  Eyes:     Conjunctiva/sclera: Conjunctivae normal.     Pupils: Pupils are equal, round, and reactive to light.  Neck:     Thyroid: No thyromegaly.     Vascular: No carotid bruit or JVD.  Cardiovascular:     Rate and Rhythm: Normal rate and regular rhythm.     Heart sounds: Normal heart sounds. No gallop.   Pulmonary:     Effort: Pulmonary effort is normal. No respiratory distress.     Breath sounds: Normal breath sounds. No wheezing or rales.  Abdominal:     General: Bowel sounds are normal. There is no distension or abdominal bruit.     Palpations: Abdomen is soft. There is no mass.     Tenderness: There is no abdominal tenderness.  Musculoskeletal:     Cervical back: Normal range of motion  and neck supple.  Lymphadenopathy:     Cervical: No cervical adenopathy.  Skin:    General: Skin is warm and dry.     Findings: No rash.  Neurological:     Mental Status: He is alert.     Sensory: No sensory deficit.     Coordination: Coordination normal.     Deep Tendon Reflexes: Reflexes are normal and symmetric. Reflexes normal.  Psychiatric:        Attention and Perception: Attention normal.        Mood and Affect: Mood normal.        Cognition and Memory: Cognition and memory normal.           Assessment & Plan:   Problem List Items Addressed This Visit      Cardiovascular and Mediastinum   Essential hypertension, benign    bp in fair control at this time  BP Readings from Last 1 Encounters:  06/27/19 134/80   No changes needed Most recent labs reviewed  Disc lifstyle change with low sodium diet and exercise  Per pt no longer on lisinopril      Relevant Medications   atorvastatin (LIPITOR) 10 MG tablet   Other Relevant Orders   CBC with Differential/Platelet   TSH   Comprehensive metabolic panel   Lipid panel     Endocrine   Diabetes mellitus type 2, uncontrolled ([place])    Under care of Dr Dwyane Dee  With insulin  Diet is  fair-enc him to do better Continues to loose weight  Nl foot exam  Sent for last dm eye exam for the chart       Relevant Medications   atorvastatin (LIPITOR) 10 MG tablet   Hyperlipidemia associated with type 2 diabetes mellitus ([place]) - Primary    Disc goals for lipids and reasons to control them Rev last labs with pt Rev low sat fat diet in detail Lab today-on statin  Sub optimal diet lately      Relevant Medications   atorvastatin (LIPITOR) 10 MG tablet   Other Relevant Orders   Lipid panel     Other   Obesity    Discussed how this problem influences overall health and the risks it imposes  Reviewed plan for weight loss with lower calorie diet (via better food choices and also portion control or program like weight watchers) and exercise building up to or more than 30 minutes 5 days per week including some aerobic activity   Commended wt loss so far      Attention or concentration deficit    Lifelong but now causing problems with work  Research scientist (medical) complete tasks  Difficulty with on the job training  Mind wanders /scatter brained and daydreams Child has ADD He would like testing Denies s/s of dep or anxiety  Ref done to psychology for testing Will review results and discuss tx in the future Recommend exercise/good self care and good org skills       Relevant Orders   Ambulatory referral to Psychology

## 2019-06-27 NOTE — Assessment & Plan Note (Signed)
Under care of Dr Lucianne Muss  With insulin  Diet is fair-enc him to do better Continues to loose weight  Nl foot exam  Sent for last dm eye exam for the chart

## 2019-06-27 NOTE — Assessment & Plan Note (Signed)
Disc goals for lipids and reasons to control them Rev last labs with pt Rev low sat fat diet in detail Lab today-on statin  Sub optimal diet lately

## 2019-06-27 NOTE — Patient Instructions (Addendum)
Try to get most of your carbohydrates from produce (with the exception of white potatoes)  Eat less bread/pasta/rice/snack foods/cereals/sweets and other items from the middle of the grocery store (processed carbs)  Take care of yourself  Keep working on incorporating exercise   Keep working on weight loss   I will place a referral to see someone for testing for ADD    Labs today

## 2019-06-28 LAB — COMPREHENSIVE METABOLIC PANEL
ALT: 11 U/L (ref 0–53)
AST: 14 U/L (ref 0–37)
Albumin: 4.1 g/dL (ref 3.5–5.2)
Alkaline Phosphatase: 87 U/L (ref 39–117)
BUN: 17 mg/dL (ref 6–23)
CO2: 29 mEq/L (ref 19–32)
Calcium: 9.2 mg/dL (ref 8.4–10.5)
Chloride: 101 mEq/L (ref 96–112)
Creatinine, Ser: 0.93 mg/dL (ref 0.40–1.50)
GFR: 88.27 mL/min (ref >60.00)
Glucose, Bld: 145 mg/dL — ABNORMAL HIGH (ref 70–99)
Potassium: 4.5 mEq/L (ref 3.5–5.1)
Sodium: 137 mEq/L (ref 135–145)
Total Bilirubin: 0.4 mg/dL (ref 0.2–1.2)
Total Protein: 6.8 g/dL (ref 6.0–8.3)

## 2019-06-28 LAB — CBC WITH DIFFERENTIAL/PLATELET
Basophils Absolute: 0.1 10*3/uL (ref 0.0–0.1)
Basophils Relative: 0.8 % (ref 0.0–3.0)
Eosinophils Absolute: 0.6 10*3/uL (ref 0.0–0.7)
Eosinophils Relative: 5.2 % — ABNORMAL HIGH (ref 0.0–5.0)
HCT: 43.8 % (ref 39.0–52.0)
Hemoglobin: 14.7 g/dL (ref 13.0–17.0)
Lymphocytes Relative: 16.6 % (ref 12.0–46.0)
Lymphs Abs: 1.8 10*3/uL (ref 0.7–4.0)
MCHC: 33.6 g/dL (ref 30.0–36.0)
MCV: 86.2 fl (ref 78.0–100.0)
Monocytes Absolute: 0.7 10*3/uL (ref 0.1–1.0)
Monocytes Relative: 6.7 % (ref 3.0–12.0)
Neutro Abs: 7.6 10*3/uL (ref 1.4–7.7)
Neutrophils Relative %: 70.7 % (ref 43.0–77.0)
Platelets: 260 10*3/uL (ref 150.0–400.0)
RBC: 5.09 Mil/uL (ref 4.22–5.81)
RDW: 13.2 % (ref 11.5–15.5)
WBC: 10.7 10*3/uL — ABNORMAL HIGH (ref 4.0–10.5)

## 2019-06-28 LAB — LIPID PANEL
Cholesterol: 148 mg/dL (ref 0–200)
HDL: 27.6 mg/dL — ABNORMAL LOW (ref >39.00)
NonHDL: 120.84
Total CHOL/HDL Ratio: 5
Triglycerides: 289 mg/dL — ABNORMAL HIGH (ref 0.0–149.0)
VLDL: 57.8 mg/dL — ABNORMAL HIGH (ref 0.0–40.0)

## 2019-06-28 LAB — LDL CHOLESTEROL, DIRECT: Direct LDL: 90 mg/dL

## 2019-06-28 LAB — TSH: TSH: 1.06 u[IU]/mL (ref 0.35–4.50)

## 2019-06-29 ENCOUNTER — Encounter: Payer: Self-pay | Admitting: Family Medicine

## 2019-07-03 ENCOUNTER — Other Ambulatory Visit (INDEPENDENT_AMBULATORY_CARE_PROVIDER_SITE_OTHER): Payer: 59

## 2019-07-03 ENCOUNTER — Other Ambulatory Visit: Payer: Self-pay

## 2019-07-03 DIAGNOSIS — E1165 Type 2 diabetes mellitus with hyperglycemia: Secondary | ICD-10-CM | POA: Diagnosis not present

## 2019-07-03 DIAGNOSIS — Z794 Long term (current) use of insulin: Secondary | ICD-10-CM

## 2019-07-03 LAB — BASIC METABOLIC PANEL
BUN: 16 mg/dL (ref 6–23)
CO2: 31 mEq/L (ref 19–32)
Calcium: 9.1 mg/dL (ref 8.4–10.5)
Chloride: 101 mEq/L (ref 96–112)
Creatinine, Ser: 0.85 mg/dL (ref 0.40–1.50)
GFR: 97.92 mL/min (ref >60.00)
Glucose, Bld: 134 mg/dL — ABNORMAL HIGH (ref 70–99)
Potassium: 4.3 mEq/L (ref 3.5–5.1)
Sodium: 138 mEq/L (ref 135–145)

## 2019-07-03 LAB — HEMOGLOBIN A1C: Hgb A1c MFr Bld: 8.7 % — ABNORMAL HIGH (ref 4.6–6.5)

## 2019-07-06 ENCOUNTER — Other Ambulatory Visit: Payer: Self-pay

## 2019-07-06 ENCOUNTER — Encounter: Payer: Self-pay | Admitting: Endocrinology

## 2019-07-06 ENCOUNTER — Ambulatory Visit (INDEPENDENT_AMBULATORY_CARE_PROVIDER_SITE_OTHER): Payer: 59 | Admitting: Endocrinology

## 2019-07-06 VITALS — BP 132/74 | Ht 72.5 in | Wt 272.0 lb

## 2019-07-06 DIAGNOSIS — E782 Mixed hyperlipidemia: Secondary | ICD-10-CM | POA: Diagnosis not present

## 2019-07-06 DIAGNOSIS — E1165 Type 2 diabetes mellitus with hyperglycemia: Secondary | ICD-10-CM | POA: Diagnosis not present

## 2019-07-06 DIAGNOSIS — Z794 Long term (current) use of insulin: Secondary | ICD-10-CM | POA: Diagnosis not present

## 2019-07-06 DIAGNOSIS — Z6836 Body mass index (BMI) 36.0-36.9, adult: Secondary | ICD-10-CM | POA: Diagnosis not present

## 2019-07-06 LAB — GLUCOSE, POCT (MANUAL RESULT ENTRY): POC Glucose: 281 mg/dl — AB (ref 70–99)

## 2019-07-06 MED ORDER — OMNIPOD DASH PODS (GEN 4) MISC: 0 refills | Status: DC

## 2019-07-06 NOTE — Patient Instructions (Signed)
lantus 44 units daily

## 2019-07-06 NOTE — Progress Notes (Signed)
Patient ID: Todd Gaines, male   DOB: Jun 18, 1975, 44 y.o.   MRN: [id number]           Reason for Appointment: Follow-up for Type 2 Diabetes  Referring physician: [facility]   History of Present Illness:          Date of diagnosis of type 2 diabetes mellitus: 2012       Background history:   He believes her A1c was 12% when he was first diagnosed to have diabetes and was having symptoms of feeling very tired. Initially he made changes in his lifestyle and was able to lose some weight also He was probably treated with metformin and may have been given Tradjenta subsequently Previous records are not available He thinks his A1c had been around 7-7.5% for some time until last year He was started on basal insulin in 9/16 when his A1c was 9.9 He did not use the V-go pump because of lack of adequate insurance coverage and out-of-pocket expense, this was improving his control  Recent history:   INSULIN dosage: lantus 40 units in the evening, Humalog before  dinner usually 10-12 units  Non-insulin hypoglycemic drugs: Metformin ER 2000 mg a day, Victoza 1.8 mg daily, Invokana 300 mg daily  His A1c is 8.7, previously 9.5 and 9%  Current blood sugar patterns and problems identified:   He has not been seen in follow-up since 12/2018  He now says that he is irregular with taking all his insulin and Victoza doses on a daily basis  He was told to take Humalog with him to work when he can cover his lunch meal but he still does not do so  Sometimes will miss his evening Lantus and Victoza  Today he had [place] food and did not take any insulin for lunch and blood sugar is 281  He does not adjust his evening Humalog when he takes it based on what he is eating and he thinks sometimes blood sugar may be higher overnight from eating foods like pasta  Not clear what his blood sugar patterns are and not aware of how often he is checking his blood sugars  Again checking blood sugars mostly in the  mornings and these are ranging from 130-200, frequently over 200  He cannot afford the co-pay for the freestyle libre and has not checked again about the Dexcom device  His main exercise is just walking at work  Exercise: active at work   Side effects from medications have been: Transient nausea from Trulicity  Compliance with the medical regimen: Fair    Glucose monitoring:  0-1 times a day         Glucometer:  One Touch Blood Glucose readings as above  Previous readings:  PRE-MEAL Fasting Lunch Dinner Bedtime Overall  Glucose range:  121-172   194-242    Mean/median:     ?   POST-MEAL PC Breakfast PC Lunch PC Dinner  Glucose range:    198-274  Mean/median:       Self-care:  Typical meal intake: Breakfast is eggs, sausage sometimes, otherwise may have breakfast burrito  Snacks will be peanut butter crackers, chips, pretzels and peanut butter               Dietician visit, most recent: 10/16                Weight history: 275-298 lbs  Wt Readings from Last 3 Encounters:  07/06/19 272 lb (123.4 kg)  06/27/19 266 lb  7 oz (120.9 kg)  04/26/18 273 lb 12.8 oz (124.2 kg)    Glycemic control:   Lab Results  Component Value Date   HGBA1C 8.7 (H) 07/03/2019   HGBA1C 9.5 (H) 12/16/2018   HGBA1C 9.0 (H) 09/09/2018   Lab Results  Component Value Date   MICROALBUR <0.7 12/16/2018   LDLCALC 79 12/16/2018   CREATININE 0.85 07/03/2019    Office Visit on 07/06/2019  Component Date Value Ref Range Status   POC Glucose 07/06/2019 281* 70 - 99 mg/dl Final  Lab on 07/03/2019  Component Date Value Ref Range Status   Sodium 07/03/2019 138  135 - 145 mEq/L Final   Potassium 07/03/2019 4.3  3.5 - 5.1 mEq/L Final   Chloride 07/03/2019 101  96 - 112 mEq/L Final   CO2 07/03/2019 31  19 - 32 mEq/L Final   Glucose, Bld 07/03/2019 134* 70 - 99 mg/dL Final   BUN 07/03/2019 16  6 - 23 mg/dL Final   Creatinine, Ser 07/03/2019 0.85  0.40 - 1.50 mg/dL Final   GFR 07/03/2019  97.92  >60.00 mL/min Final   Calcium 07/03/2019 9.1  8.4 - 10.5 mg/dL Final   Hgb A1c MFr Bld 07/03/2019 8.7* 4.6 - 6.5 % Final   Glycemic Control Guidelines for People with Diabetes:Non Diabetic:  <6%Goal of Therapy: <7%Additional Action Suggested:  >8%       Allergies as of 07/06/2019   No Known Allergies     Medication List       Accurate as of July 06, 2019  3:25 PM. If you have any questions, ask your nurse or doctor.        atorvastatin 10 MG tablet Commonly known as: LIPITOR Take 1 tablet (10 mg total) by mouth daily.   insulin lispro 100 UNIT/ML KiwkPen Commonly known as: HumaLOG KwikPen 15-20 ac tid What changed:   when to take this  additional instructions   Insulin Pen Needle 32G X 5 MM Misc Commonly known as: NovoTwist Use one per day to inject victoza   BD Pen Needle Nano U/F 32G X 4 MM Misc Generic drug: Insulin Pen Needle USE TO INJECT INSULIN ONCE A DAY   Invokana 300 MG Tabs tablet Generic drug: canagliflozin TAKE ONE TABLET BY MOUTH DAILY BEFORE BREAKFAST   Lantus SoloStar 100 UNIT/ML Solostar Pen Generic drug: insulin glargine Inject 40 Units into the skin daily. Replacing Basaglar   metFORMIN 500 MG 24 hr tablet Commonly known as: GLUCOPHAGE-XR TAKE FOUR TABLETS BY MOUTH DAILY   ONE TOUCH ULTRA 2 w/Device Kit Check fasting blood sugar and 2 hour post prandial BS in afternoon or evening. E11.9.   ONE TOUCH ULTRA TEST test strip Generic drug: glucose blood Use as instructed   OneTouch Delica Lancets 82N Misc CHECK FASTING BLOOD SUGAR AND 2 HOUR POST PRANDIAL IN AFTERNOON OR EVENING.   STELARA [place] Inject into the skin.   Victoza 18 MG/3ML Sopn Generic drug: liraglutide Inject 1.'8mg'$  unde the skin once daily at the same time.       Allergies: No Known Allergies  Past Medical History:  Diagnosis Date   Diabetes mellitus without complication ([facility])    Elevated cholesterol    Psoriasis     History reviewed. No pertinent  surgical history.  Family History  Problem Relation Age of Onset   Diabetes Mother    Diabetes Paternal Grandfather    Hypertension Paternal Grandfather    Heart disease Neg Hx    Cancer Neg Hx  Social History:  reports that he has quit smoking. He has never used smokeless tobacco. He reports that he does not drink alcohol or use drugs.    Review of Systems    Lipid history: On treatment with Lipitor 10 mg as before with adequate control Has persistently low HDL   Lab Results  Component Value Date   CHOL 148 06/27/2019   HDL 27.60 (L) 06/27/2019   LDLCALC 79 12/16/2018   LDLDIRECT 90.0 06/27/2019   TRIG 289.0 (H) 06/27/2019   CHOLHDL 5 06/27/2019             Blood pressure previously was treated with lisinopril 10 mg but this was stopped with starting Invokana No renal dysfunction with Invokana  BP Readings from Last 3 Encounters:  07/06/19 132/74  06/27/19 134/80  04/26/18 118/83    Lab Results  Component Value Date   CREATININE 0.85 07/03/2019   CREATININE 0.93 06/27/2019   CREATININE 0.86 12/16/2018        LABS:  Office Visit on 07/06/2019  Component Date Value Ref Range Status   POC Glucose 07/06/2019 281* 70 - 99 mg/dl Final  Lab on 07/03/2019  Component Date Value Ref Range Status   Sodium 07/03/2019 138  135 - 145 mEq/L Final   Potassium 07/03/2019 4.3  3.5 - 5.1 mEq/L Final   Chloride 07/03/2019 101  96 - 112 mEq/L Final   CO2 07/03/2019 31  19 - 32 mEq/L Final   Glucose, Bld 07/03/2019 134* 70 - 99 mg/dL Final   BUN 07/03/2019 16  6 - 23 mg/dL Final   Creatinine, Ser 07/03/2019 0.85  0.40 - 1.50 mg/dL Final   GFR 07/03/2019 97.92  >60.00 mL/min Final   Calcium 07/03/2019 9.1  8.4 - 10.5 mg/dL Final   Hgb A1c MFr Bld 07/03/2019 8.7* 4.6 - 6.5 % Final   Glycemic Control Guidelines for People with Diabetes:Non Diabetic:  <6%Goal of Therapy: <7%Additional Action Suggested:  >8%     Physical Examination:  BP 132/74 (BP  Location: Left Arm, Patient Position: Sitting, Cuff Size: Normal)    Ht 6' 0.5" (1.842 m)    Wt 272 lb (123.4 kg)    BMI 36.38 kg/m        ASSESSMENT:  Diabetes type 2, uncontrolled with obesity  See history of present illness for detailed discussion of his current management, blood sugar patterns and problems identified  His A1c has been persistently high and now 8.7  He is insulin deficient and needs basal bolus insulin regimen He is unable to comply with the regimen for mealtime insulin especially and also sometimes will miss his basal insulin and Victoza in the evenings Blood sugar monitoring is inadequate and mostly in the morning Previously had been using this freestyle libre but cannot afford the co-pay for this Blood sugar is 281 today postprandially  Discussed importance of long-term better control He says he is interested in the OmniPod pump if it is affordable, previously could not afford the V-go pump also  LIPIDS: Well controlled   PLAN:    Discussed how the OmniPod insulin pump would work and help her with compliance with his insulin regimen and afford more convenience especially with his eating out at lunch frequently He will be given a prescription to pick up at the drugstore and if able to afford it he will schedule the training Meantime he needs to try and take his Humalog with him at any meal and keep it at room temperature unless  it is unusually hot or cold Cut back on high carbohydrate foods like pasta and pizza Check blood sugars consistently after lunch and dinner Increase evening Humalog up to 20 units for larger meals and adjust based on postprandial readings    Patient Instructions  lantus 44 units daily       Elayne Snare 07/06/2019, 3:25 PM   Note: This office note was prepared with Dragon voice recognition system technology. Any transcriptional errors that result from this process are unintentional.

## 2019-07-07 ENCOUNTER — Telehealth: Payer: Self-pay

## 2019-07-07 ENCOUNTER — Other Ambulatory Visit: Payer: Self-pay | Admitting: Endocrinology

## 2019-07-07 NOTE — Telephone Encounter (Signed)
Called pt and advised him that we needed him to come by this office at some point and sign a release form in order to send to [employer]. Pt verbalized understanding and stated that he would stop by before his lunch break today and sign the form. Once signed, the form will be faxed to [employer] to complete verification of coverage.

## 2019-07-07 NOTE — Telephone Encounter (Signed)
PA initiated via [email or web address] for Victoza.   Todd Gaines (Key: [id number]) Rx #: [id number] Victoza 18MG pen-injectors   Form [facility] Electronic Prior Authorization Form (2017 NCPDP) Created 2 hours ago Sent to Plan 1 minute ago Plan Response 1 minute ago Submit Clinical Questions less than a minute ago Determination Wait for Determination Please wait for [employer] 2017 NCPDP to return a determination.  [employer] is reviewing your PA request. Typically an electronic response will be received within 72 hours. To check for an update later, open this request from your dashboard.  You may close this dialog and return to your dashboard to perform other tasks.

## 2019-07-10 NOTE — Telephone Encounter (Signed)
Received fax from [facility] stating that the pt has been approved for Victoza.   Approval is good until 07/06/2020.

## 2019-07-17 ENCOUNTER — Telehealth: Payer: Self-pay

## 2019-07-17 ENCOUNTER — Other Ambulatory Visit: Payer: Self-pay

## 2019-07-17 MED ORDER — OMNIPOD DASH PODS (GEN 4) MISC: 1 refills | Status: DC

## 2019-07-17 NOTE — Telephone Encounter (Signed)
Rx sent 

## 2019-07-17 NOTE — Telephone Encounter (Signed)
Please send [employer] dash in [employer].  Todd Gaines is having trouble getting them.   I put the pharmacy into the chart.

## 2019-07-20 NOTE — Telephone Encounter (Signed)
[employer] called checking up and making sure we received fax yesterday for the prescription form. [phone]

## 2019-07-21 NOTE — Telephone Encounter (Signed)
Kirsten with [employer] ph# [phone] called to make sure that Fax for [facility] has been received. The above was faxed to our office on 07/19/19. Kirsten states that patient already has the pods.

## 2019-07-27 NOTE — Telephone Encounter (Signed)
Paperwork is in Writer. Please fill out and sign as soon as possible, as not to delay further.

## 2019-07-27 NOTE — Telephone Encounter (Signed)
Kristen with [employer] calling again to check up on the [employer].

## 2019-08-03 ENCOUNTER — Other Ambulatory Visit: Payer: Self-pay | Admitting: Endocrinology

## 2019-08-03 DIAGNOSIS — Z794 Long term (current) use of insulin: Secondary | ICD-10-CM

## 2019-08-07 ENCOUNTER — Other Ambulatory Visit: Payer: Self-pay | Admitting: Endocrinology

## 2019-08-07 ENCOUNTER — Other Ambulatory Visit: Payer: Self-pay | Admitting: Family Medicine

## 2019-08-07 ENCOUNTER — Telehealth: Payer: Self-pay | Admitting: Nutrition

## 2019-08-15 ENCOUNTER — Encounter: Payer: 59 | Attending: Endocrinology | Admitting: Nutrition

## 2019-08-15 ENCOUNTER — Other Ambulatory Visit: Payer: Self-pay

## 2019-08-15 DIAGNOSIS — E1165 Type 2 diabetes mellitus with hyperglycemia: Secondary | ICD-10-CM | POA: Insufficient documentation

## 2019-08-15 NOTE — Progress Notes (Signed)
Patient ID: Todd Gaines, male   DOB: 1975/09/18, 44 y.o.   MRN: [id number]           Reason for Appointment: Follow-up for Type 2 Diabetes  Referring physician: [facility]   History of Present Illness:          Date of diagnosis of type 2 diabetes mellitus: 2012       Background history:   He believes her A1c was 12% when he was first diagnosed to have diabetes and was having symptoms of feeling very tired. Initially he made changes in his lifestyle and was able to lose some weight also He was probably treated with metformin and may have been given Tradjenta subsequently Previous records are not available He thinks his A1c had been around 7-7.5% for some time until last year He was started on basal insulin in 9/16 when his A1c was 9.9 He did not use the V-go pump because of lack of adequate insurance coverage and out-of-pocket expense, this was improving his control  Recent history:     OMNIPOD PUMP SETTINGS: Basal rate 1.0 midnight-6 AM and then 1.2 Carbohydrate ratio 1:1, correction 1: 50 and target 120-140, active insulin 4 hours  INSULIN regimen previously: lantus 40 units in the evening, Humalog before  dinner usually 10-12 units  Non-insulin hypoglycemic drugs: Metformin ER 2000 mg a day, Victoza 1.8 mg daily, Invokana 300 mg daily  His A1c is last 8.7, previously 9.5 and 9%   Current blood sugar patterns and problems identified:    He started the OmniPod pump yesterday after instructions from diabetes educator  Had not taken his Lantus the night before and his blood sugar was 155 midday  Although previously had been on a total of 40 units Lantus basal insulin currently is about 28 units  Blood sugar was higher at about 4 PM at 201 92.6 units bolus  Although he had passed at dinnertime he only took 1 unit of bolus with his blood sugar 167  Subsequently took 3.1 units for a snack when blood sugar was 212 postprandially  He misunderstood and did not take his  Victoza last night and his blood sugar this morning was 196 fasting  However he took 6 units of bolus for breakfast and blood sugar in the office now is only 96  He has been shown how to do the extended bolus but he did that last night even though it was not a high-fat meal  He feels comfortable working with the pump as well as making changes in the settings  He has been told how to do the temporary basal settings for increased physical activities especially at work  Currently still using the One Touch meter and is waiting for the Contour  Exercise: active at work   Side effects from medications have been: Transient nausea from Trulicity  Compliance with the medical regimen: Fair    Glucose monitoring:  0-1 times a day         Glucometer:  One Touch Blood Glucose readings as above  Previous readings:  PRE-MEAL Fasting Lunch Dinner Bedtime Overall  Glucose range:  121-172   194-242    Mean/median:     ?   POST-MEAL PC Breakfast PC Lunch PC Dinner  Glucose range:    198-274  Mean/median:       Self-care:  Typical meal intake: Breakfast is eggs, sausage sometimes, otherwise may have breakfast burrito  Snacks will be peanut butter crackers, chips, pretzels and peanut  butter               Dietician visit, most recent: 10/16                Weight history: 275-298 lbs  Wt Readings from Last 3 Encounters:  08/16/19 265 lb (120.2 kg)  07/06/19 272 lb (123.4 kg)  06/27/19 266 lb 7 oz (120.9 kg)    Glycemic control:   Lab Results  Component Value Date   HGBA1C 8.7 (H) 07/03/2019   HGBA1C 9.5 (H) 12/16/2018   HGBA1C 9.0 (H) 09/09/2018   Lab Results  Component Value Date   MICROALBUR <0.7 12/16/2018   LDLCALC 79 12/16/2018   CREATININE 0.85 07/03/2019    No visits with results within 1 Week(s) from this visit.  Latest known visit with results is:  Office Visit on 07/06/2019  Component Date Value Ref Range Status  . POC Glucose 07/06/2019 281* 70 - 99 mg/dl Final        Allergies as of 08/16/2019   No Known Allergies     Medication List       Accurate as of August 16, 2019 10:50 AM. If you have any questions, ask your nurse or doctor.        atorvastatin 10 MG tablet Commonly known as: LIPITOR Take 1 tablet (10 mg total) by mouth daily.   insulin lispro 100 UNIT/ML KiwkPen Commonly known as: HumaLOG KwikPen 15-20 ac tid   Insulin Pen Needle 32G X 5 MM Misc Commonly known as: NovoTwist Use one per day to inject victoza   BD Pen Needle Nano U/F 32G X 4 MM Misc Generic drug: Insulin Pen Needle USE TO INJECT INSULIN ONCE A DAY   Invokana 300 MG Tabs tablet Generic drug: canagliflozin TAKE ONE TABLET BY MOUTH DAILY BEFORE BREAKFAST   Lantus SoloStar 100 UNIT/ML Solostar Pen Generic drug: insulin glargine Inject 40 Units into the skin daily. Replacing Basaglar   metFORMIN 500 MG 24 hr tablet Commonly known as: GLUCOPHAGE-XR TAKE FOUR TABLETS BY MOUTH DAILY   OmniPod Dash 5 Pack Pods Misc Apply one pod to body once every 3 days for insulin delivery.   ONE TOUCH ULTRA 2 w/Device Kit Check fasting blood sugar and 2 hour post prandial BS in afternoon or evening. E11.9.   ONE TOUCH ULTRA TEST test strip Generic drug: glucose blood Use as instructed   OneTouch Delica Lancets 15B Misc CHECK FASTING BLOOD SUGAR AND 2 HOUR POST PRANDIAL IN AFTERNOON OR EVENING.   STELARA [place] Inject into the skin.   Victoza 18 MG/3ML Sopn Generic drug: liraglutide DIAL AND INJECT UNDER THE SKIN 1.8 MG DAILY       Allergies: No Known Allergies  Past Medical History:  Diagnosis Date  . Diabetes mellitus without complication ([place])   . Elevated cholesterol   . Psoriasis     No past surgical history on file.  Family History  Problem Relation Age of Onset  . Diabetes Mother   . Diabetes Paternal Grandfather   . Hypertension Paternal Grandfather   . Heart disease Neg Hx   . Cancer Neg Hx     Social History:  reports that he has quit  smoking. He has never used smokeless tobacco. He reports that he does not drink alcohol or use drugs.    Review of Systems    Lipid history: On treatment with Lipitor 10 mg as before with adequate control of LDL  Has persistently low HDL and high triglycerides  Lab Results  Component Value Date   CHOL 148 06/27/2019   HDL 27.60 (L) 06/27/2019   LDLCALC 79 12/16/2018   LDLDIRECT 90.0 06/27/2019   TRIG 289.0 (H) 06/27/2019   CHOLHDL 5 06/27/2019             Blood pressure previously was treated with lisinopril 10 mg, subsequently normal with Invokana alone  No renal dysfunction with Invokana  BP Readings from Last 3 Encounters:  08/16/19 110/70  07/06/19 132/74  06/27/19 134/80    Lab Results  Component Value Date   CREATININE 0.85 07/03/2019   CREATININE 0.93 06/27/2019   CREATININE 0.86 12/16/2018        LABS:  No visits with results within 1 Week(s) from this visit.  Latest known visit with results is:  Office Visit on 07/06/2019  Component Date Value Ref Range Status  . POC Glucose 07/06/2019 281* 70 - 99 mg/dl Final    Physical Examination:  BP 110/70   Pulse 82   Ht 6' 0.5" (1.842 m)   Wt 265 lb (120.2 kg)   SpO2 97%   BMI 35.45 kg/m        ASSESSMENT:  Diabetes type 2, uncontrolled with obesity  See history of present illness for detailed discussion of his current management, blood sugar patterns and problems identified  His A1c has been persistently high and last 8.7  He is finally able to start using the OmniPod insulin pump, previously unable to comply with his basal bolus insulin regimen with injections Currently has not been able to get the CGM also  He is appearing to require less insulin compared to the injectable insulin especially bolus insulins and blood sugar not significantly high even with taking only 1-3 units yesterday Today with only 6 units of insulin his blood sugar is down to about 100 postprandially Also blood sugar  may have been higher this morning from not taking Victoza last night which he misunderstood    PLAN:     Discussed adjustment of basal insulin based on [place] blood sugars  Also discussed need to adequately cover her meals based on his meal size  He will likely do well with only about half of the recommended dose of mealtime insulin using 3 to 4 units for breakfast and lunch and 5-7 at dinnertime  He can add extra for higher fat meals which he would otherwise like to avoid  He will reduce the extended bolus only for higher fat meals and not routinely  BASAL will be increased to 1.15 overnight and 1.35 at 6 AM.  Bolus settings were changed in the office under supervision and he appears to be able to learn how to do this himself now  No change in bolus settings as yet  Discussed that he needs to bolus before starting.  To start using the Contour meter when available  Use temporary basal of 45 to 50% when exercising or doing a lot of walking Call if having any low sugars   Patient Instructions  3-4 for bfst and lunch and 5-7 at dinner      Elayne Snare 08/16/2019, 10:50 AM   Note: This office note was prepared with Dragon voice recognition system technology. Any transcriptional errors that result from this process are unintentional.

## 2019-08-16 ENCOUNTER — Other Ambulatory Visit: Payer: Self-pay

## 2019-08-16 ENCOUNTER — Ambulatory Visit (INDEPENDENT_AMBULATORY_CARE_PROVIDER_SITE_OTHER): Payer: 59 | Admitting: Endocrinology

## 2019-08-16 ENCOUNTER — Encounter: Payer: Self-pay | Admitting: Endocrinology

## 2019-08-16 VITALS — BP 110/70 | HR 82 | Ht 72.5 in | Wt 265.0 lb

## 2019-08-16 DIAGNOSIS — Z794 Long term (current) use of insulin: Secondary | ICD-10-CM

## 2019-08-16 DIAGNOSIS — E1165 Type 2 diabetes mellitus with hyperglycemia: Secondary | ICD-10-CM

## 2019-08-16 NOTE — Progress Notes (Signed)
Todd Gaines was identified by Name and DOB.  He was trained on how to use the Dash pump system.  Settings were put into the pump by the patient:  Basal rate: MN: 1.0, 6AM: 1.2,  I/C:1 (patient does not want to count carbs, he prefers units of insulin) ( He will put in units of insulin into the carb section of his bolus wizard, so corrections will work) ISF: 50, timing 4 hours, Max basal: 3.0, max Bolus:20, target: 120 with corrections over 140. He was shown how to bolus and did this X2 without difficulty.  He filled a pod with Humalog insulin, Lot: [id number]   Exp.:1/23      He attached the pod to his left abdomen without difficulty, and his pod was started at 11:15 AM.  He did not take his long acting insulin this morning.  We reviewed all topics on the pump checklist, how/when to use the extended bolus, start/stopping the pump and we reviewed and he was given handouts on high blood sugar protocol, sick days, reasons for high/low blood sugars,  emergency/supply kit needs, how to give a bolus, change the pod, and how to change the pump settings. He had no questions and did not want a call this evening from me.   He was told to test his blood sugars before meals and at bedtime, and to do a correction dose if blood sugars are over 250 at HS.  He agreed to do this. He was not put into the computer, because I did not have an updated password.  I have contacted Olen Cordial for this.

## 2019-08-16 NOTE — Patient Instructions (Signed)
3-4 for bfst and lunch and 5-7 at dinner

## 2019-08-16 NOTE — Patient Instructions (Addendum)
Review handouts given and call if questions. Read over manual Call help line if questions.

## 2019-08-17 ENCOUNTER — Ambulatory Visit (INDEPENDENT_AMBULATORY_CARE_PROVIDER_SITE_OTHER): Payer: 59 | Admitting: Endocrinology

## 2019-08-17 ENCOUNTER — Encounter: Payer: Self-pay | Admitting: Endocrinology

## 2019-08-17 ENCOUNTER — Other Ambulatory Visit: Payer: Self-pay

## 2019-08-17 DIAGNOSIS — E1165 Type 2 diabetes mellitus with hyperglycemia: Secondary | ICD-10-CM

## 2019-08-17 DIAGNOSIS — Z794 Long term (current) use of insulin: Secondary | ICD-10-CM | POA: Diagnosis not present

## 2019-08-17 MED ORDER — GLUCOSE BLOOD VI STRP: ORAL_STRIP | 2 refills | Status: DC

## 2019-08-17 NOTE — Patient Instructions (Signed)
Bolus 3 to 4 units for breakfast and lunch and 4-6 at dinnertime  Check blood sugars on waking up 6-7 days a week  Also check blood sugars about 2 hours after meals and do this after different meals by rotation  Recommended blood sugar levels on waking up are 90-130 and about 2 hours after meal is 130-160

## 2019-08-17 NOTE — Progress Notes (Signed)
Patient ID: Todd Gaines, male   DOB: [birth year], 44 y.o.   MRN: [id number]           Reason for Appointment: Follow-up for Type 2 Diabetes  Referring physician: [facility]   History of Present Illness:          Date of diagnosis of type 2 diabetes mellitus: 2012       Background history:   He believes her A1c was 12% when he was first diagnosed to have diabetes and was having symptoms of feeling very tired. Initially he made changes in his lifestyle and was able to lose some weight also He was probably treated with metformin and may have been given Tradjenta subsequently Previous records are not available He thinks his A1c had been around 7-7.5% for some time until last year He was started on basal insulin in 9/16 when his A1c was 9.9 He did not use the V-go pump because of lack of adequate insurance coverage and out-of-pocket expense, this was improving his control  Recent history:     OMNIPOD PUMP SETTINGS: Basal rate 1.15 midnight-6 AM and then 1.35 Carbohydrate ratio 1:1, correction 1: 50 and target 120-140, active insulin 4 hours  INSULIN regimen previously: lantus 40 units in the evening, Humalog before  dinner usually 10-12 units  Non-insulin hypoglycemic drugs: Metformin ER 2000 mg a day, Victoza 1.8 mg daily, Invokana 300 mg daily  His A1c is last 8.7, previously 9.5 and 9%  Current blood sugar patterns and problems identified:    He has improved fasting blood sugar today at 125  With increasing his basal rate yesterday his blood sugars have been overall better  However he still not understanding how to bolus and how much to calculate for meals  He was told to put in the actual units that he wants to bolus without carb counting but is only putting in 1 unit for each bolus  Yesterday he went out to eat [place] food and did not bolus until he came back home and blood sugar was 169  Also today after breakfast with only 1 unit boluses blood sugar is 237; yesterday  he took 6 units with better results.  Today he had a peanut butter toast for breakfast  Yesterday afternoon even though he was walking more his blood sugar was 150 around 2:30 PM; did not check blood sugar before dinner  No hypoglycemia  Currently still using the One Touch meter and is waiting for the Contour  Exercise: active at work   Side effects from medications have been: Transient nausea from Trulicity  Compliance with the medical regimen: Fair    Glucose monitoring:  0-1 times a day         Glucometer:  One Touch Blood Glucose readings as above  Previous readings:  PRE-MEAL Fasting Lunch Dinner Bedtime Overall  Glucose range:  121-172   194-242    Mean/median:     ?   POST-MEAL PC Breakfast PC Lunch PC Dinner  Glucose range:    198-274  Mean/median:       Self-care:  Typical meal intake: Breakfast is eggs, sausage sometimes, otherwise may have breakfast burrito  Snacks will be peanut butter crackers, chips, pretzels and peanut butter               Dietician visit, most recent: 10/16                Weight history: 275-298 lbs  Wt Readings from Last 3  Encounters:  08/16/19 265 lb (120.2 kg)  07/06/19 272 lb (123.4 kg)  06/27/19 266 lb 7 oz (120.9 kg)    Glycemic control:   Lab Results  Component Value Date   HGBA1C 8.7 (H) 07/03/2019   HGBA1C 9.5 (H) 12/16/2018   HGBA1C 9.0 (H) 09/09/2018   Lab Results  Component Value Date   MICROALBUR <0.7 12/16/2018   LDLCALC 79 12/16/2018   CREATININE 0.85 07/03/2019    No visits with results within 1 Week(s) from this visit.  Latest known visit with results is:  Office Visit on 07/06/2019  Component Date Value Ref Range Status  . POC Glucose 07/06/2019 281* 70 - 99 mg/dl Final      Allergies as of 08/17/2019   No Known Allergies     Medication List       Accurate as of August 17, 2019 11:31 AM. If you have any questions, ask your nurse or doctor.        STOP taking these medications   Lantus SoloStar  100 UNIT/ML Solostar Pen Generic drug: insulin glargine Stopped by: Elayne Snare, MD   OneTouch Delica Lancets 93Z Misc Stopped by: Elayne Snare, MD     TAKE these medications   atorvastatin 10 MG tablet Commonly known as: LIPITOR Take 1 tablet (10 mg total) by mouth daily.   Contour Next One Kit 1 each by Does not apply route in the morning, at noon, in the evening, and at bedtime. Use [employer] Contour Next One meter to check blood sugar 4 times daily. What changed: Another medication with the same name was removed. Continue taking this medication, and follow the directions you see here. Changed by: Elayne Snare, MD   glucose blood test strip Use Contour Next test strips as instructed to check blood sugar 4 times daily. What changed:   how much to take  how to take this  when to take this  Another medication with the same name was removed. Continue taking this medication, and follow the directions you see here. Changed by: Jayme Cloud, LPN   insulin lispro 100 UNIT/ML KiwkPen Commonly known as: HumaLOG KwikPen 15-20 ac tid   Insulin Pen Needle 32G X 5 MM Misc Commonly known as: NovoTwist Use one per day to inject victoza What changed: Another medication with the same name was removed. Continue taking this medication, and follow the directions you see here. Changed by: Elayne Snare, MD   Invokana 300 MG Tabs tablet Generic drug: canagliflozin TAKE ONE TABLET BY MOUTH DAILY BEFORE BREAKFAST   metFORMIN 500 MG 24 hr tablet Commonly known as: GLUCOPHAGE-XR Take 1,500 mg by mouth daily with breakfast. Take 3 tablet ('1500mg'$  total) by mouth daily. What changed: Another medication with the same name was removed. Continue taking this medication, and follow the directions you see here. Changed by: Elayne Snare, MD   OmniPod Dash 5 Pack Pods Misc Apply one pod to body once every 3 days for insulin delivery.   STELARA [place] Inject into the skin.   Victoza 18 MG/3ML Sopn Generic drug:  liraglutide DIAL AND INJECT UNDER THE SKIN 1.8 MG DAILY       Allergies: No Known Allergies  Past Medical History:  Diagnosis Date  . Diabetes mellitus without complication ([place])   . Elevated cholesterol   . Psoriasis     History reviewed. No pertinent surgical history.  Family History  Problem Relation Age of Onset  . Diabetes Mother   . Diabetes Paternal Grandfather   .  Hypertension Paternal Grandfather   . Heart disease Neg Hx   . Cancer Neg Hx     Social History:  reports that he has quit smoking. He has never used smokeless tobacco. He reports that he does not drink alcohol or use drugs.    Review of Systems    Lipid history: On treatment with Lipitor 10 mg as before with adequate control of LDL  Has persistently low HDL and high triglycerides   Lab Results  Component Value Date   CHOL 148 06/27/2019   HDL 27.60 (L) 06/27/2019   LDLCALC 79 12/16/2018   LDLDIRECT 90.0 06/27/2019   TRIG 289.0 (H) 06/27/2019   CHOLHDL 5 06/27/2019             Blood pressure previously was treated with lisinopril 10 mg, subsequently normal with Invokana alone  No renal dysfunction with Invokana  BP Readings from Last 3 Encounters:  08/16/19 110/70  07/06/19 132/74  06/27/19 134/80    Lab Results  Component Value Date   CREATININE 0.85 07/03/2019   CREATININE 0.93 06/27/2019   CREATININE 0.86 12/16/2018       Physical Examination:  There were no vitals taken for this visit.       ASSESSMENT:  Diabetes type 2, uncontrolled with obesity  See history of present illness for detailed discussion of his current management, blood sugar patterns and problems identified  His A1c has been persistently high and last 8.7  He is requiring much less insulin with the pump Also surprisingly does not appear to have much hypoglycemia after meals especially dinner with only minimal boluses Appears to be needing more insulin at breakfast since his postprandial reading was  237 He did resume Victoza last night as directed    PLAN:    Needs to put in his preset boluses for meals using 3 to 4 units in the morning and lunchtime and 4-6 units at dinnertime based on his diet Needs to check blood sugar before and after meals for now again As before he can use temporary basal when very active No change in basal rate as yet unless blood sugars are consistently high in the afternoon   Patient Instructions  Bolus 3 to 4 units for breakfast and lunch and 4-6 at dinnertime  Check blood sugars on waking up 6-7 days a week  Also check blood sugars about 2 hours after meals and do this after different meals by rotation  Recommended blood sugar levels on waking up are 90-130 and about 2 hours after meal is 130-160            Elayne Snare 08/17/2019, 11:31 AM   Note: This office note was prepared with Dragon voice recognition system technology. Any transcriptional errors that result from this process are unintentional.

## 2019-08-18 ENCOUNTER — Ambulatory Visit: Payer: 59 | Admitting: Endocrinology

## 2019-08-22 ENCOUNTER — Telehealth: Payer: Self-pay | Admitting: Nutrition

## 2019-08-22 NOTE — Telephone Encounter (Signed)
Opened in error

## 2019-08-22 NOTE — Telephone Encounter (Signed)
Message left on machine to call me concerning his insulin pump.  Doing a 1 week checkin/checkup and asked him to call me.

## 2019-08-29 ENCOUNTER — Ambulatory Visit (INDEPENDENT_AMBULATORY_CARE_PROVIDER_SITE_OTHER): Payer: 59 | Admitting: Psychology

## 2019-08-29 DIAGNOSIS — F909 Attention-deficit hyperactivity disorder, unspecified type: Secondary | ICD-10-CM | POA: Diagnosis not present

## 2019-08-30 ENCOUNTER — Other Ambulatory Visit: Payer: Self-pay

## 2019-08-30 ENCOUNTER — Other Ambulatory Visit (INDEPENDENT_AMBULATORY_CARE_PROVIDER_SITE_OTHER): Payer: 59

## 2019-08-30 DIAGNOSIS — E1165 Type 2 diabetes mellitus with hyperglycemia: Secondary | ICD-10-CM | POA: Diagnosis not present

## 2019-08-30 DIAGNOSIS — Z794 Long term (current) use of insulin: Secondary | ICD-10-CM | POA: Diagnosis not present

## 2019-08-30 LAB — BASIC METABOLIC PANEL
BUN: 17 mg/dL (ref 6–23)
CO2: 33 mEq/L — ABNORMAL HIGH (ref 19–32)
Calcium: 9.4 mg/dL (ref 8.4–10.5)
Chloride: 102 mEq/L (ref 96–112)
Creatinine, Ser: 0.92 mg/dL (ref 0.40–1.50)
GFR: 89.31 mL/min (ref >60.00)
Glucose, Bld: 128 mg/dL — ABNORMAL HIGH (ref 70–99)
Potassium: 4.5 mEq/L (ref 3.5–5.1)
Sodium: 138 mEq/L (ref 135–145)

## 2019-08-31 LAB — FRUCTOSAMINE: Fructosamine: 258 umol/L (ref 0–285)

## 2019-09-04 ENCOUNTER — Ambulatory Visit (INDEPENDENT_AMBULATORY_CARE_PROVIDER_SITE_OTHER): Payer: 59 | Admitting: Endocrinology

## 2019-09-04 ENCOUNTER — Encounter: Payer: Self-pay | Admitting: Endocrinology

## 2019-09-04 ENCOUNTER — Other Ambulatory Visit: Payer: Self-pay

## 2019-09-04 VITALS — BP 130/82 | HR 103 | Ht 72.5 in | Wt 270.0 lb

## 2019-09-04 DIAGNOSIS — E1165 Type 2 diabetes mellitus with hyperglycemia: Secondary | ICD-10-CM

## 2019-09-04 DIAGNOSIS — Z794 Long term (current) use of insulin: Secondary | ICD-10-CM | POA: Diagnosis not present

## 2019-09-04 MED ORDER — NOVOTWIST 32G X 5 MM MISC: 3 refills | Status: DC

## 2019-09-04 MED ORDER — ONETOUCH VERIO VI STRP: 1.0000 | ORAL_STRIP | Freq: Four times a day (QID) | 2 refills | Status: DC

## 2019-09-04 NOTE — Progress Notes (Signed)
Patient ID: Todd Gaines, male   DOB: 1976/01/01, 45 y.o.   MRN: [id number]           Reason for Appointment: Follow-up for Type 2 Diabetes  Referring physician: Tower   History of Present Illness:          Date of diagnosis of type 2 diabetes mellitus: 2012       Background history:   He believes her A1c was 12% when he was first diagnosed to have diabetes and was having symptoms of feeling very tired. Initially he made changes in his lifestyle and was able to lose some weight also He was probably treated with metformin and may have been given Tradjenta subsequently Previous records are not available He thinks his A1c had been around 7-7.5% for some time until last year He was started on basal insulin in 9/16 when his A1c was 9.9 He did not use the V-go pump because of lack of adequate insurance coverage and out-of-pocket expense, this was improving his control  Recent history:   OMNIPOD PUMP SETTINGS: Basal rate 1.15 midnight-6 AM and then 1.35 Carbohydrate ratio 1:1, correction 1: 50 and target 120-140, active insulin 4 hours  Total insulin use recently about 37 units with 24% in boluses  INSULIN regimen previously: lantus 40 units in the evening, Humalog before  dinner usually 10-12 units  Non-insulin hypoglycemic drugs: Metformin ER 2000 mg a day, Victoza 1.8 mg daily, Invokana 300 mg daily  His A1c is last 8.7, previously 9.5 However fructosamine is excellent at 258 compared to previous readings  Current blood sugar patterns and problems identified:    He has overall high blood sugars at home  Although he is using the pump he is still having difficulty remembering to do his boluses before meals and frequently missing them at dinnertime  He is trying to keep his PDM with him at work for lunch coverage but does not appear to be having boluses on some days otherwise  He will sometimes wait till later in the evening to check his blood sugar and then do a blood sugar  for correction only  He was also advised to go up on his boluses to at least 3 to 4 units in the morning and 46 at lunch and dinner but is not always increasing his bolus appropriately  Today he only took 2 units for 2 slices of bread and his blood sugar is over 200 after eating  No hypoglycemia, lowest blood sugar recently 115 before dinnertime and he has not felt his sugars getting low  Currently he is not able to get insurance coverage for his Contour which is more expensive than One Touch  Exercise: active at work   Side effects from medications have been: Transient nausea from Trulicity  Compliance with the medical regimen: Fair    Glucose monitoring:  0-1 times a day         Glucometer:  One Touch Blood Glucose readings as above   PRE-MEAL Fasting Lunch Dinner Bedtime Overall  Glucose range:  126-188  129-207  115-291  137-269  115-301  Mean/median:  160  165  235  208  182    Previous readings:  PRE-MEAL Fasting Lunch Dinner Bedtime Overall  Glucose range:  121-172   194-242    Mean/median:     ?   POST-MEAL PC Breakfast PC Lunch PC Dinner  Glucose range:    198-274  Mean/median:       Self-care:  Typical meal intake: Breakfast is eggs, sausage sometimes, otherwise may have breakfast burrito  Snacks will be peanut butter crackers, chips, pretzels and peanut butter               Dietician visit, most recent: 10/16                Weight history: 275-298 lbs  Wt Readings from Last 3 Encounters:  09/04/19 270 lb (122.5 kg)  08/16/19 265 lb (120.2 kg)  07/06/19 272 lb (123.4 kg)    Glycemic control:   Lab Results  Component Value Date   HGBA1C 8.7 (H) 07/03/2019   HGBA1C 9.5 (H) 12/16/2018   HGBA1C 9.0 (H) 09/09/2018   Lab Results  Component Value Date   MICROALBUR <0.7 12/16/2018   LDLCALC 79 12/16/2018   CREATININE 0.92 08/30/2019    Lab on 08/30/2019  Component Date Value Ref Range Status  . Fructosamine 08/30/2019 258  0 - 285 umol/L Final    Comment: Published reference interval for apparently healthy subjects between age 70 and 69 is 61 - 285 umol/L and in a poorly controlled diabetic population is 228 - 563 umol/L with a mean of 396 umol/L.   Marland Kitchen Sodium 08/30/2019 138  135 - 145 mEq/L Final  . Potassium 08/30/2019 4.5  3.5 - 5.1 mEq/L Final  . Chloride 08/30/2019 102  96 - 112 mEq/L Final  . CO2 08/30/2019 33* 19 - 32 mEq/L Final  . Glucose, Bld 08/30/2019 128* 70 - 99 mg/dL Final  . BUN 08/30/2019 17  6 - 23 mg/dL Final  . Creatinine, Ser 08/30/2019 0.92  0.40 - 1.50 mg/dL Final  . GFR 08/30/2019 89.31  >60.00 mL/min Final  . Calcium 08/30/2019 9.4  8.4 - 10.5 mg/dL Final      Allergies as of 09/04/2019   No Known Allergies     Medication List       Accurate as of September 04, 2019  8:40 AM. If you have any questions, ask your nurse or doctor.        atorvastatin 10 MG tablet Commonly known as: LIPITOR Take 1 tablet (10 mg total) by mouth daily.   Contour Next One Kit 1 each by Does not apply route in the morning, at noon, in the evening, and at bedtime. Use Bayer Contour Next One meter to check blood sugar 4 times daily.   glucose blood test strip Use Contour Next test strips as instructed to check blood sugar 4 times daily.   insulin lispro 100 UNIT/ML KiwkPen Commonly known as: HumaLOG KwikPen 15-20 ac tid   Insulin Pen Needle 32G X 5 MM Misc Commonly known as: NovoTwist Use one per day to inject victoza   Invokana 300 MG Tabs tablet Generic drug: canagliflozin TAKE ONE TABLET BY MOUTH DAILY BEFORE BREAKFAST   metFORMIN 500 MG 24 hr tablet Commonly known as: GLUCOPHAGE-XR Take 1,500 mg by mouth daily with breakfast. Take 3 tablet ('1500mg'$  total) by mouth daily.   OmniPod Dash 5 Pack Pods Misc Apply one pod to body once every 3 days for insulin delivery.   STELARA [place] Inject into the skin.   Victoza 18 MG/3ML Sopn Generic drug: liraglutide DIAL AND INJECT UNDER THE SKIN 1.8 MG DAILY        Allergies: No Known Allergies  Past Medical History:  Diagnosis Date  . Diabetes mellitus without complication ([place])   . Elevated cholesterol   . Psoriasis     No past surgical history on  file.  Family History  Problem Relation Age of Onset  . Diabetes Mother   . Diabetes Paternal Grandfather   . Hypertension Paternal Grandfather   . Heart disease Neg Hx   . Cancer Neg Hx     Social History:  reports that he has quit smoking. He has never used smokeless tobacco. He reports that he does not drink alcohol and does not use drugs.    Review of Systems    Lipid history: On treatment with Lipitor 10 mg as before with adequate control of LDL  Has persistently low HDL and high triglycerides   Lab Results  Component Value Date   CHOL 148 06/27/2019   HDL 27.60 (L) 06/27/2019   LDLCALC 79 12/16/2018   LDLDIRECT 90.0 06/27/2019   TRIG 289.0 (H) 06/27/2019   CHOLHDL 5 06/27/2019             Blood pressure previously was treated with lisinopril 10 mg, subsequently normal with Invokana alone  No renal dysfunction with Invokana 300 mg  BP Readings from Last 3 Encounters:  09/04/19 130/82  08/16/19 110/70  07/06/19 132/74    Lab Results  Component Value Date   CREATININE 0.92 08/30/2019   CREATININE 0.85 07/03/2019   CREATININE 0.93 06/27/2019       Physical Examination:  BP 130/82   Pulse (!) 103   Ht 6' 0.5" (1.842 m)   Wt 270 lb (122.5 kg)   SpO2 97%   BMI 36.12 kg/m        ASSESSMENT:  Diabetes type 2, with obesity  See history of present illness for detailed discussion of his current management, blood sugar patterns and problems identified  His A1c has been persistently high and last 8.7 Fructosamine is 258  Although initially his blood sugars were improving there are averaging over 180 now at home  His insulin pump download, blood sugar patterns and day-to-day management was discussed in detail  He is still requiring relatively low doses  of boluses compared to when he was on injection However he does not appear to be getting enough insulin to cover his meals although not checking enough readings postprandially He is tending to forget to bolus before meals frequently especially in the evenings now Did not understand that he needs to bolus before eating especially with carbohydrates, will need to do this even if he has not checked his sugar at the mealtime Explained to him that he can do more postprandial readings to help adjust his boluses  Currently regular with his Victoza and Invokana   PLAN:    Increase boluses for meals again recommending 3 to 4 units in the morning and lunchtime and 4-6 units at dinnertime based on his portions and calorie intake We will try to bolus ahead of time as discussed above As before he can use temporary basal when more active than usual We will go up on his basal rate up to 1.25 at midnight and 1.45 at 6 AM Call if he is getting consistently high readings He can go back to using the One Touch test strips but enter blood sugars manually   There are no Patient Instructions on file for this visit.     Elayne Snare 09/04/2019, 8:40 AM   Note: This office note was prepared with Dragon voice recognition system technology. Any transcriptional errors that result from this process are unintentional.

## 2019-09-07 ENCOUNTER — Emergency Department: Payer: 59

## 2019-09-07 ENCOUNTER — Other Ambulatory Visit: Payer: Self-pay

## 2019-09-07 ENCOUNTER — Inpatient Hospital Stay
Admission: EM | Admit: 2019-09-07 | Discharge: 2019-09-09 | DRG: 247 | Disposition: A | Discharge status: Home / Self Care | Payer: 59 | Attending: Hospitalist | Admitting: Hospitalist

## 2019-09-07 ENCOUNTER — Telehealth: Payer: Self-pay

## 2019-09-07 DIAGNOSIS — E1165 Type 2 diabetes mellitus with hyperglycemia: Secondary | ICD-10-CM | POA: Diagnosis present

## 2019-09-07 DIAGNOSIS — E1169 Type 2 diabetes mellitus with other specified complication: Secondary | ICD-10-CM | POA: Diagnosis present

## 2019-09-07 DIAGNOSIS — Z833 Family history of diabetes mellitus: Secondary | ICD-10-CM

## 2019-09-07 DIAGNOSIS — I2510 Atherosclerotic heart disease of native coronary artery without angina pectoris: Secondary | ICD-10-CM | POA: Diagnosis present

## 2019-09-07 DIAGNOSIS — Z794 Long term (current) use of insulin: Secondary | ICD-10-CM

## 2019-09-07 DIAGNOSIS — Z87891 Personal history of nicotine dependence: Secondary | ICD-10-CM

## 2019-09-07 DIAGNOSIS — Z8249 Family history of ischemic heart disease and other diseases of the circulatory system: Secondary | ICD-10-CM | POA: Diagnosis not present

## 2019-09-07 DIAGNOSIS — IMO0002 Reserved for concepts with insufficient information to code with codable children: Secondary | ICD-10-CM | POA: Diagnosis present

## 2019-09-07 DIAGNOSIS — I10 Essential (primary) hypertension: Secondary | ICD-10-CM | POA: Diagnosis present

## 2019-09-07 DIAGNOSIS — L409 Psoriasis, unspecified: Secondary | ICD-10-CM | POA: Diagnosis present

## 2019-09-07 DIAGNOSIS — E785 Hyperlipidemia, unspecified: Secondary | ICD-10-CM | POA: Diagnosis present

## 2019-09-07 DIAGNOSIS — Z20822 Contact with and (suspected) exposure to covid-19: Secondary | ICD-10-CM | POA: Diagnosis present

## 2019-09-07 DIAGNOSIS — Z79899 Other long term (current) drug therapy: Secondary | ICD-10-CM

## 2019-09-07 DIAGNOSIS — I214 Non-ST elevation (NSTEMI) myocardial infarction: Secondary | ICD-10-CM | POA: Diagnosis present

## 2019-09-07 LAB — CBC
HCT: 45.6 % (ref 39.0–52.0)
Hemoglobin: 15.3 g/dL (ref 13.0–17.0)
MCH: 28.1 pg (ref 26.0–34.0)
MCHC: 33.6 g/dL (ref 30.0–36.0)
MCV: 83.8 fL (ref 80.0–100.0)
Platelets: 282 10*3/uL (ref 150–400)
RBC: 5.44 MIL/uL (ref 4.22–5.81)
RDW: 12.7 % (ref 11.5–15.5)
WBC: 12.2 10*3/uL — ABNORMAL HIGH (ref 4.0–10.5)
nRBC: 0 % (ref 0.0–0.2)

## 2019-09-07 LAB — APTT: aPTT: 31 seconds (ref 24–36)

## 2019-09-07 LAB — SARS CORONAVIRUS 2 BY RT PCR (HOSPITAL ORDER, PERFORMED IN ~~LOC~~ HOSPITAL LAB): SARS Coronavirus 2: NEGATIVE

## 2019-09-07 LAB — BASIC METABOLIC PANEL
Anion gap: 12 (ref 5–15)
BUN: 16 mg/dL (ref 6–20)
CO2: 27 mmol/L (ref 22–32)
Calcium: 9.7 mg/dL (ref 8.9–10.3)
Chloride: 100 mmol/L (ref 98–111)
Creatinine, Ser: 0.86 mg/dL (ref 0.61–1.24)
GFR calc Af Amer: >60 mL/min (ref >60)
GFR calc non Af Amer: >60 mL/min (ref >60)
Glucose, Bld: 120 mg/dL — ABNORMAL HIGH (ref 70–99)
Potassium: 4.4 mmol/L (ref 3.5–5.1)
Sodium: 139 mmol/L (ref 135–145)

## 2019-09-07 LAB — HEPATIC FUNCTION PANEL
ALT: 11 U/L (ref 0–44)
AST: 26 U/L (ref 15–41)
Albumin: 3.9 g/dL (ref 3.5–5.0)
Alkaline Phosphatase: 74 U/L (ref 38–126)
Bilirubin, Direct: <0.1 mg/dL (ref 0.0–0.2)
Total Bilirubin: 0.8 mg/dL (ref 0.3–1.2)
Total Protein: 7 g/dL (ref 6.5–8.1)

## 2019-09-07 LAB — TROPONIN I (HIGH SENSITIVITY)
Troponin I (High Sensitivity): 1129 ng/L (ref <18)
Troponin I (High Sensitivity): 1557 ng/L (ref <18)

## 2019-09-07 LAB — LIPASE, BLOOD: Lipase: 25 U/L (ref 11–51)

## 2019-09-07 LAB — PROTIME-INR
INR: 1 (ref 0.8–1.2)
Prothrombin Time: 12.6 seconds (ref 11.4–15.2)

## 2019-09-07 MED ORDER — ONDANSETRON HCL 4 MG/2ML IJ SOLN: 4.0000 mg | Freq: Four times a day (QID) | INTRAMUSCULAR | Status: DC | PRN

## 2019-09-07 MED ORDER — ASPIRIN 81 MG PO CHEW
324.0000 mg | CHEWABLE_TABLET | Freq: Once | ORAL | Status: AC
  Administered 2019-09-07: 324 mg via ORAL
  Filled 2019-09-07: qty 4

## 2019-09-07 MED ORDER — INSULIN ASPART 100 UNIT/ML ~~LOC~~ SOLN
0.0000 [IU] | Freq: Three times a day (TID) | SUBCUTANEOUS | Status: DC
  Administered 2019-09-08: 2 [IU] via SUBCUTANEOUS
  Administered 2019-09-08: 3 [IU] via SUBCUTANEOUS
  Filled 2019-09-07 (×2): qty 1

## 2019-09-07 MED ORDER — ACETAMINOPHEN 325 MG PO TABS
650.0000 mg | ORAL_TABLET | ORAL | Status: DC | PRN
  Administered 2019-09-08: 650 mg via ORAL

## 2019-09-07 MED ORDER — INSULIN LISPRO 100 UNIT/ML ~~LOC~~ SOLN: SUBCUTANEOUS | 2 refills | Status: DC

## 2019-09-07 MED ORDER — ASPIRIN EC 81 MG PO TBEC
81.0000 mg | DELAYED_RELEASE_TABLET | Freq: Every day | ORAL | Status: DC
  Administered 2019-09-08: 81 mg via ORAL
  Filled 2019-09-07: qty 1

## 2019-09-07 MED ORDER — NITROGLYCERIN 0.4 MG SL SUBL: 0.4000 mg | SUBLINGUAL_TABLET | SUBLINGUAL | Status: DC | PRN

## 2019-09-07 MED ORDER — HEPARIN BOLUS VIA INFUSION
4000.0000 [IU] | Freq: Once | INTRAVENOUS | Status: AC
  Administered 2019-09-07: 4000 [IU] via INTRAVENOUS
  Filled 2019-09-07: qty 4000

## 2019-09-07 MED ORDER — HEPARIN (PORCINE) 25000 UT/250ML-% IV SOLN
1500.0000 [IU]/h | INTRAVENOUS | Status: DC
  Administered 2019-09-07: 1300 [IU]/h via INTRAVENOUS
  Filled 2019-09-07: qty 250

## 2019-09-07 MED ORDER — SODIUM CHLORIDE 0.9% FLUSH
3.0000 mL | Freq: Once | INTRAVENOUS | Status: AC
  Administered 2019-09-07: 3 mL via INTRAVENOUS

## 2019-09-07 MED ORDER — ATORVASTATIN CALCIUM 10 MG PO TABS
10.0000 mg | ORAL_TABLET | Freq: Every day | ORAL | Status: DC
  Administered 2019-09-07: 10 mg via ORAL
  Filled 2019-09-07: qty 1

## 2019-09-07 NOTE — Telephone Encounter (Addendum)
Pt said this morning at 4:30 pt got up to get ready for work; pt said he noticed some chest tightness in center of his chest; when taking a deep breath caused pt to have mid dull achy CP on and off; pt said he did not notice radiation of pain into his neck,jaw, shoulder or arm but pt did feel like he had a dull soreness or pain in lt shoulder & arm that only lasted few seconds. The chest tightness and pain are new symptoms. For a while pt having on and off H/A. Pt said this morning had H/A in top of head that was dull pain and after taking ibuprofen H/A stopped until after lunch and H/A started again in top of head. Pt took ibuprofen and H/A went away. Pt said for few wks having H/As on and off.  Pt said on and off has blurred vision due to contacts and pt had that this morning but thinks not chest related but contacts related per pt. No dizziness. Pt is diabetic. So right now no H/A but continues with tightness and CP on and off and since new symptom pt will go to [facility] in [place] for eval since no available appts at [facility] this afternoon. FYI to Dr Milinda Antis as PCP and Allayne Gitelman NP that is covering for Dr Milinda Antis. Pt went to [facility] and pt was advised to go to ED; pt said UC did not ck him or do vitals or EKG. Pt said he will either go to [facility] in [place] or [facility] ED.

## 2019-09-07 NOTE — ED Triage Notes (Signed)
Pt comes via POV from home with c/o CP that started early this am the patient states he thought it was heartburn. Pt states it has just been constant and not gone away.  Pt states headache. Pt states midsternal chest pain. Pt denies any radiation. Pt states heaviness. Pt states pain when taking deep breath.

## 2019-09-07 NOTE — Telephone Encounter (Signed)
I will watch for correspondence, thanks  

## 2019-09-07 NOTE — ED Notes (Signed)
Pt given a sandwich tray ?

## 2019-09-07 NOTE — Consult Note (Signed)
ANTICOAGULATION CONSULT NOTE - Initial Consult  Pharmacy Consult for Heparin Infusion Indication: chest pain/ACS  No Known Allergies  Patient Measurements: Height: 6' 0.5" (184.2 cm) Weight: 122.5 kg IBW/kg (Calculated) : 78.75 Heparin Dosing Weight: 105.6 kg  Vital Signs: Temp: 97.7 F (36.5 C) (06/24 1819) BP: 123/79 (06/24 1819) Pulse Rate: 62 (06/24 1819)  Labs: Recent Labs    09/07/19 1815  HGB 15.3  HCT 45.6  PLT 282  CREATININE 0.86  TROPONINIHS 1,129*    Estimated Creatinine Clearance: 149.3 mL/min (by C-G formula based on SCr of 0.86 mg/dL).   Medical History: Past Medical History:  Diagnosis Date  . Diabetes mellitus without complication (HCC)   . Elevated cholesterol   . Psoriasis     Medications:  (Not in a hospital admission)  Scheduled:  . aspirin  324 mg Oral Once  . heparin  4,000 Units Intravenous Once  . sodium chloride flush  3 mL Intravenous Once   Infusions:  . heparin     PRN:  Anti-infectives (From admission, onward)   None      Assessment: Pharmacy has been consulted to initiate heparin infusion in 44yo patient with no history of anticoagulant use. Presented to ED with chest pain and new onset of chest tightness. Troponin level of 1,129. Baseline labs have been ordered and are pending.  Goal of Therapy:  Heparin level 0.3-0.7 units/ml Monitor platelets by anticoagulation protocol: Yes   Plan:  Give 4000 units bolus x 1 Start heparin infusion at 1300 units/hr Check anti-Xa level in 6 hours and daily while on heparin Continue to monitor H&H and platelets  Mercedes Valeriano A Shanta Dorvil 09/07/2019,7:33 PM

## 2019-09-07 NOTE — ED Provider Notes (Signed)
[facility] Emergency Department Provider Note  ____________________________________________   First MD Initiated Contact with Patient 09/07/19 1910     (approximate)  I have reviewed the triage vital signs and the nursing notes.   HISTORY  Chief Complaint Chest Pain    HPI NEWMAN WAREN is a 44 y.o. male with diabetes, hyperlipidemia who comes in with chest pain.  Patient has chest pain that started early this morning.  Patient states that started around 430.  Initially had a headache took some ibuprofen and the headache went away.  However then he started having a chest pressure sensation in the middle of his chest that was moderate, constant, nothing made it better, nothing made it worse.  Denies any radiation of the pain or shortness of breath.  He states that the pain continued even after the headache resolved.  He states that he thought it could just be acid reflux and took some Tums with minimal to no relief.  He he states that he took the softball team and is worried that he could have Covid and did not want to actually expose anybody so that is why he came to the emergency room.  He denies any abdominal pain.  Currently the pain is much better is only a 2 out of 10.  States that he can barely feel it.  He denies any shortness of breath, leg swelling, risk factors for PE.  He otherwise is very healthy.  No cardiac disease previously.          Past Medical History:  Diagnosis Date  . Diabetes mellitus without complication ([place])   . Elevated cholesterol   . Psoriasis     Patient Active Problem List   Diagnosis Date Noted  . Routine general medical examination at a health care facility 01/26/2018  . Screening for HIV without presence of risk factors 06/23/2017  . Attention or concentration deficit 04/08/2016  . Essential hypertension, benign 03/05/2015  . Obesity 04/19/2014  . Diabetes mellitus type 2, uncontrolled ([place]) 04/18/2014  .  Hyperlipidemia associated with type 2 diabetes mellitus ([place]) 04/18/2014  . Psoriasis 04/18/2014    History reviewed. No pertinent surgical history.  Prior to Admission medications   Medication Sig Start Date End Date Taking? Authorizing Provider  atorvastatin (LIPITOR) 10 MG tablet Take 1 tablet (10 mg total) by mouth daily. 06/27/19   Tower, Wynelle Fanny, MD  Blood Glucose Monitoring Suppl (CONTOUR NEXT ONE) KIT 1 each by Does not apply route in the morning, at noon, in the evening, and at bedtime. Use Bayer Contour Next One meter to check blood sugar 4 times daily.    [provider]  glucose blood (ONETOUCH VERIO) test strip 1 each by Other route 4 (four) times daily. E11.65 09/04/19   Elayne Snare, MD  Insulin Disposable Pump (OMNIPOD DASH 5 PACK PODS) MISC Apply one pod to body once every 3 days for insulin delivery. 07/17/19   Elayne Snare, MD  insulin lispro (HUMALOG) 100 UNIT/ML injection Use max of 55 units daily via insulin pump. 09/07/19   Elayne Snare, MD  Insulin Pen Needle (NOVOTWIST) 32G X 5 MM MISC Use one per day to inject victoza 09/04/19   Elayne Snare, MD  INVOKANA 300 MG TABS tablet TAKE ONE TABLET BY MOUTH DAILY BEFORE BREAKFAST 07/07/19   Elayne Snare, MD  metFORMIN (GLUCOPHAGE-XR) 500 MG 24 hr tablet Take 1,500 mg by mouth daily with breakfast. Take 3 tablet ('1500mg'$  total) by mouth daily.  [provider]  Ustekinumab (STELARA [place]) Inject into the skin.    [provider]  VICTOZA 18 MG/3ML SOPN DIAL AND INJECT UNDER THE SKIN 1.8 MG DAILY 07/07/19   Elayne Snare, MD    Allergies Patient has no known allergies.  Family History  Problem Relation Age of Onset  . Diabetes Mother   . Diabetes Paternal Grandfather   . Hypertension Paternal Grandfather   . Heart disease Neg Hx   . Cancer Neg Hx     Social History Social History   Tobacco Use  . Smoking status: Former Research scientist (life sciences)  . Smokeless tobacco: Never Used  Substance Use Topics  . Alcohol use: No     Alcohol/week: 0.0 standard drinks  . Drug use: No      Review of Systems Constitutional: No fever/chills Eyes: No visual changes. ENT: No sore throat. Cardiovascular: Positive chest pain Respiratory: Denies shortness of breath. Gastrointestinal: No abdominal pain.  No nausea, no vomiting.  No diarrhea.  No constipation. Genitourinary: Negative for dysuria. Musculoskeletal: Negative for back pain. Skin: Negative for rash. Neurological: Negative for headaches, focal weakness or numbness. All other ROS negative ____________________________________________   PHYSICAL EXAM:  VITAL SIGNS: ED Triage Vitals  Enc Vitals Group     BP 09/07/19 1819 123/79     Pulse Rate 09/07/19 1819 62     Resp 09/07/19 1819 17     Temp 09/07/19 1819 97.7 F (36.5 C)     Temp src --      SpO2 09/07/19 1819 98 %     Weight 09/07/19 1814 270 lb (122.5 kg)     Height 09/07/19 1814 6' 0.5" (1.842 m)     Head Circumference --      Peak Flow --      Pain Score 09/07/19 1814 3     Pain Loc --      Pain Edu? --      Excl. in [place]? --     Constitutional: Alert and oriented. Well appearing and in no acute distress. Eyes: Conjunctivae are normal. EOMI. Head: Atraumatic. Nose: No congestion/rhinnorhea. Mouth/Throat: Mucous membranes are moist.   Neck: No stridor. Trachea Midline. FROM Cardiovascular: Normal rate, regular rhythm. Grossly normal heart sounds.  Good peripheral circulation. Respiratory: Normal respiratory effort.  No retractions. Lungs CTAB. Gastrointestinal: Soft and nontender. No distention. No abdominal bruits.  Musculoskeletal: No lower extremity tenderness nor edema.  No joint effusions. Neurologic:  Normal speech and language. No gross focal neurologic deficits are appreciated.  Skin:  Skin is warm, dry and intact. No rash noted. Psychiatric: Mood and affect are normal. Speech and behavior are normal. GU: Deferred   ____________________________________________   LABS (all labs  ordered are listed, but only abnormal results are displayed)  Labs Reviewed  BASIC METABOLIC PANEL - Abnormal; Notable for the following components:      Result Value   Glucose, Bld 120 (*)    All other components within normal limits  CBC - Abnormal; Notable for the following components:   WBC 12.2 (*)    All other components within normal limits  TROPONIN I (HIGH SENSITIVITY) - Abnormal; Notable for the following components:   Troponin I (High Sensitivity) 1,129 (*)    All other components within normal limits  SARS CORONAVIRUS 2 BY RT PCR (HOSPITAL ORDER, [facility])  HEPATIC FUNCTION PANEL  LIPASE, BLOOD   ____________________________________________   ED ECG REPORT I, Vanessa [place], the attending physician, personally  viewed and interpreted this ECG.  EKG is sinus rate of 84, no ST elevation, T wave inversion in lead III, normal intervals ____________________________________________  RADIOLOGY Robert Bellow, personally viewed and evaluated these images (plain radiographs) as part of my medical decision making, as well as reviewing the written report by the radiologist.  ED MD interpretation: No pneumonia  Official radiology report(s): DG Chest 2 View  Result Date: 09/07/2019 CLINICAL DATA:  Chest pain. EXAM: CHEST - 2 VIEW COMPARISON:  None. FINDINGS: The heart size and mediastinal contours are within normal limits. Both lungs are clear. The visualized skeletal structures are unremarkable. IMPRESSION: Normal exam. Electronically Signed   By: Lorriane Shire M.D.   On: 09/07/2019 18:49    ____________________________________________   PROCEDURES  Procedure(s) performed (including Critical Care):  .Critical Care Performed by: Vanessa [place], MD Authorized by: Vanessa Pinole, MD   Critical care provider statement:    Critical care time (minutes):  35   Critical care was necessary to treat or prevent imminent or life-threatening  deterioration of the following conditions: NSTEMI requiring heparin    Critical care was time spent personally by me on the following activities:  Discussions with consultants, evaluation of patient's response to treatment, examination of patient, ordering and performing treatments and interventions, ordering and review of laboratory studies, ordering and review of radiographic studies, pulse oximetry, re-evaluation of patient's condition, obtaining history from patient or surrogate and review of old charts .1-3 Lead EKG Interpretation Performed by: Vanessa [place], MD Authorized by: Vanessa [place], MD     Interpretation: normal     ECG rate:  60s    ECG rate assessment: normal     Rhythm: sinus rhythm     Ectopy: none     Conduction: normal       ____________________________________________   INITIAL IMPRESSION / ASSESSMENT AND PLAN / ED COURSE   ABDULAZIZ TOMAN was evaluated in Emergency Department on 09/07/2019 for the symptoms described in the history of present illness. He was evaluated in the context of the global COVID-19 pandemic, which necessitated consideration that the patient might be at risk for infection with the SARS-CoV-2 virus that causes COVID-19. Institutional protocols and algorithms that pertain to the evaluation of patients at risk for COVID-19 are in a state of rapid change based on information released by regulatory bodies including the CDC and federal and state organizations. These policies and algorithms were followed during the patient's care in the ED.    Most Likely DDx:  - ACS given risk factors/age.  Given the report of chest pain will keep patient on the cardiac monitor to make sure no evidence of arrhythmia.  DDx that was also considered d/t potential to cause harm, but was found less likely based on history and physical (as detailed above): -PNA (no fevers, cough but CXR to evaluate) -PNX (reassured with equal b/l breath sounds, CXR to  evaluate) -Symptomatic anemia (will get H&H) -Pulmonary embolism as no sob at rest, not pleuritic in nature, no hypoxia -Aortic Dissection as no tearing pain and no radiation to the mid back, pulses equal -Pericarditis no rub on exam, EKG changes or hx to suggest dx -Tamponade (no notable SOB, tachycardic, hypotensive) -Esophageal rupture (no h/o diffuse vomitting/no crepitus)  White count slightly elevated but no infectious symptoms Chest x-ray no pneumonia No evidence of anemia. Troponin noted to be 1129  No evidence of arrhythmia on the monitor.  Given significant troponin elevation will give patient  aspirin, heparin and let cardiology know about patient and discussed the hospital team for admission for NSTEMI.  Patient has remained on the cardiac monitor and has had no arrhythmias  ____________________________________________   FINAL CLINICAL IMPRESSION(S) / ED DIAGNOSES   Final diagnoses:  NSTEMI (non-ST elevated myocardial infarction) ([place])     MEDICATIONS GIVEN DURING THIS VISIT:  Medications  sodium chloride flush (NS) 0.9 % injection 3 mL (has no administration in time range)  aspirin chewable tablet 324 mg (has no administration in time range)     ED Discharge Orders    None       Note:  This document was prepared using Dragon voice recognition software and may include unintentional dictation errors.   Vanessa Marshall, MD 09/08/19 1147

## 2019-09-07 NOTE — H&P (Signed)
History and Physical    Todd Gaines:[id number] DOB: 1975/08/07 DOA: 09/07/2019  PCP: Abner Greenspan, MD  Patient coming from: Home  I have personally briefly reviewed patient's old medical records in [place]  Chief Complaint: Chest pain  HPI: Todd Gaines is a 44 y.o. male with medical history significant for insulin-dependent type 2 diabetes, hypertension, hyperlipidemia, and psoriasis who presents to the ED for evaluation of chest pain.  Patient states he was in his usual state of health until morning of 09/07/2019 when he woke with mild headache and midsternal chest discomfort.  Chest discomfort was described as a pressure/heavy-like sensation without radiation to his arms, neck, jaw, or back.  Chest pain has been constant without obvious exacerbating or relieving factors including exertion or positional changes.  He had no associated dyspnea, palpitations, diaphoresis, fevers, chills, nausea, vomiting, cough, lightheadedness/dizziness, or numbness/tingling.  He denies any obvious bleeding.  He took ibuprofen with relief of his headache.  His chest discomfort was not improving and he tried Tums thinking this was related to heartburn but did not have further relief.  He came to the ED for further evaluation.  At this time he says his chest discomfort is improving, 2/10 in intensity at time of admission.  He reports a remote history of mild tobacco use years ago.  He denies any significant alcohol or illicit drug use.  He reports a history of diabetes in his mother.  ED Course:  Initial vitals showed BP 123/79, pulse 62, RR 17, temp 97.7 Fahrenheit, SPO2 98% on room air.  Labs are notable for high-sensitivity troponin I 1129, WBC 12.2, hemoglobin 15.3, platelets 282,000, sodium 139, potassium 4.4, bicarb 27, BUN 16, creatinine 0.86, serum glucose 120.  SARS-CoV-2 PCR is obtained and pending.  2 view chest x-ray is negative for focal consolidation, edema, or  effusion.  Initial EKG showed sinus rhythm with sinus arrhythmia.  Repeat EKG showed sinus rhythm with low voltage, T wave inversion lead III and aVF, T wave flattening in lead II and lateral leads.  No prior EKGs for comparison.  Patient was given aspirin 324 mg and started on IV heparin infusion.  Cardiology were notified.  The hospitalist service was consulted for further evaluation and management.  Review of Systems: All systems reviewed and are negative except as documented in history of present illness above.   Past Medical History:  Diagnosis Date  . Diabetes mellitus without complication ([place])   . Elevated cholesterol   . Psoriasis     History reviewed. No pertinent surgical history.  Social History:  reports that he has quit smoking. He has never used smokeless tobacco. He reports that he does not drink alcohol and does not use drugs.  No Known Allergies  Family History  Problem Relation Age of Onset  . Diabetes Mother   . Diabetes Paternal Grandfather   . Hypertension Paternal Grandfather   . Heart disease Neg Hx   . Cancer Neg Hx      Prior to Admission medications   Medication Sig Start Date End Date Taking? Authorizing Provider  atorvastatin (LIPITOR) 10 MG tablet Take 1 tablet (10 mg total) by mouth daily. 06/27/19   Tower, Wynelle Fanny, MD  Blood Glucose Monitoring Suppl (CONTOUR NEXT ONE) KIT 1 each by Does not apply route in the morning, at noon, in the evening, and at bedtime. Use Bayer Contour Next One meter to check blood sugar 4 times daily.    [provider]  glucose blood (ONETOUCH VERIO) test strip 1 each by Other route 4 (four) times daily. E11.65 09/04/19   Elayne Snare, MD  Insulin Disposable Pump (OMNIPOD DASH 5 PACK PODS) MISC Apply one pod to body once every 3 days for insulin delivery. 07/17/19   Elayne Snare, MD  insulin lispro (HUMALOG) 100 UNIT/ML injection Use max of 55 units daily via insulin pump. 09/07/19   Elayne Snare, MD  Insulin Pen  Needle (NOVOTWIST) 32G X 5 MM MISC Use one per day to inject victoza 09/04/19   Elayne Snare, MD  INVOKANA 300 MG TABS tablet TAKE ONE TABLET BY MOUTH DAILY BEFORE BREAKFAST 07/07/19   Elayne Snare, MD  metFORMIN (GLUCOPHAGE-XR) 500 MG 24 hr tablet Take 1,500 mg by mouth daily with breakfast. Take 3 tablet ('1500mg'$  total) by mouth daily.    [provider]  Ustekinumab (STELARA [place]) Inject into the skin.    [provider]  VICTOZA 18 MG/3ML SOPN DIAL AND INJECT UNDER THE SKIN 1.8 MG DAILY 07/07/19   Elayne Snare, MD    Physical Exam: Vitals:   09/07/19 1814 09/07/19 1819  BP:  123/79  Pulse:  62  Resp:  17  Temp:  97.7 F (36.5 C)  SpO2:  98%  Weight: 122.5 kg   Height: 6' 0.5" (1.842 m)    Constitutional: NAD, calm, comfortable Eyes: PERRL, lids and conjunctivae normal ENMT: Mucous membranes are moist. Posterior pharynx clear of any exudate or lesions.Normal dentition.  Neck: normal, supple, no masses. Respiratory: clear to auscultation bilaterally, no wheezing, no crackles. Normal respiratory effort. No accessory muscle use.  Cardiovascular: Regular rate and rhythm, no murmurs / rubs / gallops. No extremity edema. 2+ pedal pulses. Abdomen: no tenderness, no masses palpated. No hepatosplenomegaly. Bowel sounds positive.  Musculoskeletal: no clubbing / cyanosis. No joint deformity upper and lower extremities. Good ROM, no contractures. Normal muscle tone.  Skin: no rashes, lesions, ulcers. No induration Neurologic: CN 2-12 grossly intact. Sensation intact, Strength 5/5 in all 4.  Psychiatric: Normal judgment and insight. Alert and oriented x 3. Normal mood.   Labs on Admission: I have personally reviewed following labs and imaging studies  CBC: Recent Labs  Lab 09/07/19 1815  WBC 12.2*  HGB 15.3  HCT 45.6  MCV 83.8  PLT 387   Basic Metabolic Panel: Recent Labs  Lab 09/07/19 1815  NA 139  K 4.4  CL 100  CO2 27  GLUCOSE 120*  BUN 16  CREATININE 0.86   CALCIUM 9.7   GFR: Estimated Creatinine Clearance: 149.3 mL/min (by C-G formula based on SCr of 0.86 mg/dL). Liver Function Tests: No results for input(s): AST, ALT, ALKPHOS, BILITOT, PROT, ALBUMIN in the last 168 hours. No results for input(s): LIPASE, AMYLASE in the last 168 hours. No results for input(s): AMMONIA in the last 168 hours. Coagulation Profile: No results for input(s): INR, PROTIME in the last 168 hours. Cardiac Enzymes: No results for input(s): CKTOTAL, CKMB, CKMBINDEX, TROPONINI in the last 168 hours. BNP (last 3 results) No results for input(s): PROBNP in the last 8760 hours. HbA1C: No results for input(s): HGBA1C in the last 72 hours. CBG: No results for input(s): GLUCAP in the last 168 hours. Lipid Profile: No results for input(s): CHOL, HDL, LDLCALC, TRIG, CHOLHDL, LDLDIRECT in the last 72 hours. Thyroid Function Tests: No results for input(s): TSH, T4TOTAL, FREET4, T3FREE, THYROIDAB in the last 72 hours. Anemia Panel: No results for input(s): VITAMINB12, FOLATE, FERRITIN, TIBC, IRON, RETICCTPCT in the last 72  hours. Urine analysis:    Component Value Date/Time   COLORURINE YELLOW 02/16/2017 1611   APPEARANCEUR CLEAR 02/16/2017 1611   LABSPEC >=1.030 (A) 02/16/2017 1611   PHURINE 6.0 02/16/2017 1611   GLUCOSEU 500 (A) 02/16/2017 1611   HGBUR NEGATIVE 02/16/2017 1611   BILIRUBINUR NEGATIVE 02/16/2017 1611   KETONESUR NEGATIVE 02/16/2017 1611   UROBILINOGEN 0.2 02/16/2017 1611   NITRITE NEGATIVE 02/16/2017 1611   LEUKOCYTESUR NEGATIVE 02/16/2017 1611    Radiological Exams on Admission: DG Chest 2 View  Result Date: 09/07/2019 CLINICAL DATA:  Chest pain. EXAM: CHEST - 2 VIEW COMPARISON:  None. FINDINGS: The heart size and mediastinal contours are within normal limits. Both lungs are clear. The visualized skeletal structures are unremarkable. IMPRESSION: Normal exam. Electronically Signed   By: Lorriane Shire M.D.   On: 09/07/2019 18:49    EKG:  Independently reviewed.  Initial EKG showed sinus arrhythmia without acute ischemic changes.  Repeat EKG showed sinus rhythm with low voltage, T wave inversion lead III and aVF, T wave flattening in lead II and lateral leads.  No prior EKGs for comparison.  Assessment/Plan Principal Problem:   NSTEMI (non-ST elevated myocardial infarction) ([place]) Active Problems:   Diabetes mellitus type 2, uncontrolled ([place])   Hyperlipidemia associated with type 2 diabetes mellitus ([place])   Essential hypertension, benign  Todd Gaines is a 44 y.o. male with medical history significant for insulin-dependent type 2 diabetes, hypertension, hyperlipidemia, and psoriasis who is admitted for evaluation of NSTEMI.  NSTEMI: Patient with new onset pressure-like midsternal chest pain with elevated high-sensitivity troponin 1129.  T wave changes noted in the inferior leads on repeat EKG.  Currently hemodynamically stable without significant chest pain. -Continue heparin infusion -Aspirin 81 mg daily -Monitor on telemetry, repeat EKG in a.m. and as needed -Continue home atorvastatin -Sublingual nitroglycerin as needed -Obtain echocardiogram -Cardiology aware and to follow, will keep n.p.o. at midnight  Insulin-dependent type 2 diabetes: Last A1c 8.7% on 07/03/2019.  Home regimen includes Metformin, Invokana, Victoza, and recently started insulin pump.  Will hold home oral meds and place on moderate sliding scale insulin, adjust as needed.  Hypertension: Previously on lisinopril which was discontinued due to improved control after starting Invokana.  Holding Invokana at this time.  Blood pressure current stable, continue monitor.  Hyperlipidemia: Continue atorvastatin.  DVT prophylaxis: Heparin gtt Code Status: Full code, confirmed with patient Family Communication: Discussed with patient's mother at bedside Disposition Plan: From home and likely discharge home pending further NSTEMI work-up Consults called:  Cardiology Admission status:  Status is: Inpatient  Remains inpatient appropriate because:Ongoing diagnostic testing needed not appropriate for outpatient work up and IV treatments appropriate due to intensity of illness or inability to take PO  Dispo: The patient is from: Home              Anticipated d/c is to: Home              Anticipated d/c date is: 2 days pending NSTEMI work-up.              Patient currently is not medically stable to d/c.  Zada Finders MD [facility]  If 7PM-7AM, please contact night-coverage [email or web address]  09/07/2019, 7:46 PM

## 2019-09-07 NOTE — ED Notes (Signed)
Iv attempts by 2 nurses, IVs infiltrated quickly. IV team consult placed.

## 2019-09-07 NOTE — Telephone Encounter (Signed)
Pt went to [facility] and BP 140/80; pt said he will go to either [facility] or [facility] ED since pt has new onset of chest tightness, CP with deep breath and is a diabetic.

## 2019-09-08 ENCOUNTER — Other Ambulatory Visit: Payer: Self-pay

## 2019-09-08 ENCOUNTER — Encounter
Admission: EM | Disposition: A | Discharge status: Home / Self Care | Payer: Self-pay | Source: Home / Self Care | Attending: Hospitalist

## 2019-09-08 ENCOUNTER — Inpatient Hospital Stay (HOSPITAL_COMMUNITY)
Admit: 2019-09-08 | Discharge: 2019-09-08 | Disposition: A | Discharge status: Home / Self Care | Payer: 59 | Attending: Internal Medicine | Admitting: Internal Medicine

## 2019-09-08 DIAGNOSIS — I214 Non-ST elevation (NSTEMI) myocardial infarction: Secondary | ICD-10-CM

## 2019-09-08 DIAGNOSIS — I2510 Atherosclerotic heart disease of native coronary artery without angina pectoris: Secondary | ICD-10-CM

## 2019-09-08 DIAGNOSIS — E1165 Type 2 diabetes mellitus with hyperglycemia: Secondary | ICD-10-CM

## 2019-09-08 DIAGNOSIS — E785 Hyperlipidemia, unspecified: Secondary | ICD-10-CM

## 2019-09-08 HISTORY — PX: LEFT HEART CATH AND CORONARY ANGIOGRAPHY: CATH118249

## 2019-09-08 HISTORY — PX: CORONARY STENT INTERVENTION: CATH118234

## 2019-09-08 LAB — POCT ACTIVATED CLOTTING TIME
Activated Clotting Time: 274 seconds
Activated Clotting Time: 279 seconds
Activated Clotting Time: 290 seconds

## 2019-09-08 LAB — CBC
HCT: 43.3 % (ref 39.0–52.0)
Hemoglobin: 14.7 g/dL (ref 13.0–17.0)
MCH: 28.5 pg (ref 26.0–34.0)
MCHC: 33.9 g/dL (ref 30.0–36.0)
MCV: 84.1 fL (ref 80.0–100.0)
Platelets: 242 10*3/uL (ref 150–400)
RBC: 5.15 MIL/uL (ref 4.22–5.81)
RDW: 12.6 % (ref 11.5–15.5)
WBC: 9.8 10*3/uL (ref 4.0–10.5)
nRBC: 0 % (ref 0.0–0.2)

## 2019-09-08 LAB — BASIC METABOLIC PANEL
Anion gap: 10 (ref 5–15)
BUN: 16 mg/dL (ref 6–20)
CO2: 28 mmol/L (ref 22–32)
Calcium: 8.8 mg/dL — ABNORMAL LOW (ref 8.9–10.3)
Chloride: 102 mmol/L (ref 98–111)
Creatinine, Ser: 0.96 mg/dL (ref 0.61–1.24)
GFR calc Af Amer: >60 mL/min (ref >60)
GFR calc non Af Amer: >60 mL/min (ref >60)
Glucose, Bld: 141 mg/dL — ABNORMAL HIGH (ref 70–99)
Potassium: 4 mmol/L (ref 3.5–5.1)
Sodium: 140 mmol/L (ref 135–145)

## 2019-09-08 LAB — TROPONIN I (HIGH SENSITIVITY)
Troponin I (High Sensitivity): 2057 ng/L (ref <18)
Troponin I (High Sensitivity): 2831 ng/L (ref <18)

## 2019-09-08 LAB — GLUCOSE, CAPILLARY
Glucose-Capillary: 112 mg/dL — ABNORMAL HIGH (ref 70–99)
Glucose-Capillary: 119 mg/dL — ABNORMAL HIGH (ref 70–99)
Glucose-Capillary: 128 mg/dL — ABNORMAL HIGH (ref 70–99)
Glucose-Capillary: 136 mg/dL — ABNORMAL HIGH (ref 70–99)
Glucose-Capillary: 146 mg/dL — ABNORMAL HIGH (ref 70–99)
Glucose-Capillary: 175 mg/dL — ABNORMAL HIGH (ref 70–99)

## 2019-09-08 LAB — LIPID PANEL
Cholesterol: 123 mg/dL (ref 0–200)
HDL: 27 mg/dL — ABNORMAL LOW (ref >40)
LDL Cholesterol: 50 mg/dL (ref 0–99)
Total CHOL/HDL Ratio: 4.6 RATIO
Triglycerides: 229 mg/dL — ABNORMAL HIGH (ref <150)
VLDL: 46 mg/dL — ABNORMAL HIGH (ref 0–40)

## 2019-09-08 LAB — HEPARIN LEVEL (UNFRACTIONATED): Heparin Unfractionated: 0.15 IU/mL — ABNORMAL LOW (ref 0.30–0.70)

## 2019-09-08 LAB — ECHOCARDIOGRAM COMPLETE
Height: 72 in
Weight: 4254 oz

## 2019-09-08 LAB — HIV ANTIBODY (ROUTINE TESTING W REFLEX): HIV Screen 4th Generation wRfx: NONREACTIVE

## 2019-09-08 SURGERY — LEFT HEART CATH AND CORONARY ANGIOGRAPHY
Anesthesia: Moderate Sedation

## 2019-09-08 MED ORDER — VERAPAMIL HCL 2.5 MG/ML IV SOLN
INTRAVENOUS | Status: AC
  Filled 2019-09-08: qty 2

## 2019-09-08 MED ORDER — SODIUM CHLORIDE 0.9 % IV SOLN: INTRAVENOUS | Status: AC

## 2019-09-08 MED ORDER — PRASUGREL HCL 10 MG PO TABS
ORAL_TABLET | ORAL | Status: AC
  Filled 2019-09-08: qty 6

## 2019-09-08 MED ORDER — VERAPAMIL HCL 2.5 MG/ML IV SOLN
INTRAVENOUS | Status: DC | PRN
  Administered 2019-09-08: 2.5 mg via INTRA_ARTERIAL

## 2019-09-08 MED ORDER — HEPARIN (PORCINE) IN NACL 1000-0.9 UT/500ML-% IV SOLN
INTRAVENOUS | Status: AC
  Filled 2019-09-08: qty 1000

## 2019-09-08 MED ORDER — IOHEXOL 300 MG/ML  SOLN
INTRAMUSCULAR | Status: DC | PRN
  Administered 2019-09-08: 130 mL

## 2019-09-08 MED ORDER — HYDRALAZINE HCL 20 MG/ML IJ SOLN: 10.0000 mg | INTRAMUSCULAR | Status: AC | PRN

## 2019-09-08 MED ORDER — HEPARIN SODIUM (PORCINE) 1000 UNIT/ML IJ SOLN
INTRAMUSCULAR | Status: DC | PRN
  Administered 2019-09-08: 5000 [IU] via INTRAVENOUS
  Administered 2019-09-08: 7000 [IU] via INTRAVENOUS
  Administered 2019-09-08 (×2): 3000 [IU] via INTRAVENOUS

## 2019-09-08 MED ORDER — SODIUM CHLORIDE 0.9 % IV SOLN: 250.0000 mL | INTRAVENOUS | Status: DC | PRN

## 2019-09-08 MED ORDER — PRASUGREL HCL 10 MG PO TABS
10.0000 mg | ORAL_TABLET | Freq: Every day | ORAL | Status: DC
  Administered 2019-09-09: 10 mg via ORAL
  Filled 2019-09-08: qty 1

## 2019-09-08 MED ORDER — HEPARIN (PORCINE) IN NACL 1000-0.9 UT/500ML-% IV SOLN
INTRAVENOUS | Status: DC | PRN
  Administered 2019-09-08: 1000 mL

## 2019-09-08 MED ORDER — SODIUM CHLORIDE 0.9 % WEIGHT BASED INFUSION: 1.0000 mL/kg/h | INTRAVENOUS | Status: DC

## 2019-09-08 MED ORDER — SODIUM CHLORIDE 0.9% FLUSH: 3.0000 mL | Freq: Two times a day (BID) | INTRAVENOUS | Status: DC

## 2019-09-08 MED ORDER — MIDAZOLAM HCL 2 MG/2ML IJ SOLN
INTRAMUSCULAR | Status: DC | PRN
  Administered 2019-09-08 (×2): 1 mg via INTRAVENOUS

## 2019-09-08 MED ORDER — HEPARIN SODIUM (PORCINE) 1000 UNIT/ML IJ SOLN
INTRAMUSCULAR | Status: AC
  Filled 2019-09-08: qty 1

## 2019-09-08 MED ORDER — FENTANYL CITRATE (PF) 100 MCG/2ML IJ SOLN
INTRAMUSCULAR | Status: DC | PRN
  Administered 2019-09-08 (×2): 25 ug via INTRAVENOUS

## 2019-09-08 MED ORDER — FENTANYL CITRATE (PF) 100 MCG/2ML IJ SOLN
INTRAMUSCULAR | Status: AC
  Filled 2019-09-08: qty 2

## 2019-09-08 MED ORDER — SODIUM CHLORIDE 0.9% FLUSH: 3.0000 mL | INTRAVENOUS | Status: DC | PRN

## 2019-09-08 MED ORDER — HEPARIN BOLUS VIA INFUSION
2000.0000 [IU] | Freq: Once | INTRAVENOUS | Status: AC
  Administered 2019-09-08: 2000 [IU] via INTRAVENOUS
  Filled 2019-09-08: qty 2000

## 2019-09-08 MED ORDER — NITROGLYCERIN 1 MG/10 ML FOR IR/CATH LAB
INTRA_ARTERIAL | Status: DC | PRN
  Administered 2019-09-08 (×2): 200 ug via INTRACORONARY

## 2019-09-08 MED ORDER — ENOXAPARIN SODIUM 40 MG/0.4ML ~~LOC~~ SOLN
40.0000 mg | SUBCUTANEOUS | Status: DC
  Filled 2019-09-08: qty 0.4

## 2019-09-08 MED ORDER — SODIUM CHLORIDE 0.9 % WEIGHT BASED INFUSION
3.0000 mL/kg/h | INTRAVENOUS | Status: DC
  Administered 2019-09-08: 3 mL/kg/h via INTRAVENOUS

## 2019-09-08 MED ORDER — MIDAZOLAM HCL 2 MG/2ML IJ SOLN
INTRAMUSCULAR | Status: AC
  Filled 2019-09-08: qty 2

## 2019-09-08 MED ORDER — LIDOCAINE HCL (PF) 1 % IJ SOLN
INTRAMUSCULAR | Status: DC | PRN
  Administered 2019-09-08: 2 mL via SUBCUTANEOUS

## 2019-09-08 MED ORDER — LABETALOL HCL 5 MG/ML IV SOLN: 10.0000 mg | INTRAVENOUS | Status: AC | PRN

## 2019-09-08 MED ORDER — ASPIRIN EC 81 MG PO TBEC
81.0000 mg | DELAYED_RELEASE_TABLET | Freq: Every day | ORAL | Status: DC
  Administered 2019-09-09: 81 mg via ORAL
  Filled 2019-09-08: qty 1

## 2019-09-08 MED ORDER — SODIUM CHLORIDE 0.9% FLUSH
3.0000 mL | INTRAVENOUS | Status: DC | PRN
  Administered 2019-09-08: 3 mL via INTRAVENOUS

## 2019-09-08 MED ORDER — SODIUM CHLORIDE 0.9 % WEIGHT BASED INFUSION: 3.0000 mL/kg/h | INTRAVENOUS | Status: DC

## 2019-09-08 MED ORDER — SODIUM CHLORIDE 0.9% FLUSH
3.0000 mL | Freq: Two times a day (BID) | INTRAVENOUS | Status: DC
  Administered 2019-09-08 (×2): 3 mL via INTRAVENOUS

## 2019-09-08 MED ORDER — ACETAMINOPHEN 325 MG PO TABS
ORAL_TABLET | ORAL | Status: AC
  Filled 2019-09-08: qty 2

## 2019-09-08 MED ORDER — PRASUGREL HCL 10 MG PO TABS
ORAL_TABLET | ORAL | Status: DC | PRN
  Administered 2019-09-08: 60 mg via ORAL

## 2019-09-08 MED ORDER — ATORVASTATIN CALCIUM 80 MG PO TABS
80.0000 mg | ORAL_TABLET | Freq: Every day | ORAL | Status: DC
  Administered 2019-09-08 – 2019-09-09 (×2): 80 mg via ORAL
  Filled 2019-09-08 (×2): qty 1

## 2019-09-08 SURGICAL SUPPLY — 22 items
BALLN TREK RX 2.25X12 (BALLOONS) ×3
BALLN WOLVERINE 2.75X15 (BALLOONS) ×3
BALLN ~~LOC~~ TREK RX 3.5X15 (BALLOONS) ×3
BALLOON TREK RX 2.25X12 (BALLOONS) IMPLANT
BALLOON WOLVERINE 2.75X15 (BALLOONS) IMPLANT
BALLOON ~~LOC~~ TREK RX 3.5X15 (BALLOONS) IMPLANT
CATH 5F 110X4 TIG (CATHETERS) ×2 IMPLANT
CATH INFINITI JR4 5F (CATHETERS) ×2 IMPLANT
CATH LAUNCHER 6FR JR4 (CATHETERS) ×2 IMPLANT
DEVICE INFLAT 30 PLUS (MISCELLANEOUS) ×2 IMPLANT
DEVICE RAD COMP TR BAND LRG (VASCULAR PRODUCTS) ×2 IMPLANT
GLIDESHEATH SLEND SS 6F .021 (SHEATH) ×2 IMPLANT
GUIDEWIRE INQWIRE 1.5J.035X260 (WIRE) IMPLANT
INQWIRE 1.5J .035X260CM (WIRE) ×3
KIT MANI 3VAL PERCEP (MISCELLANEOUS) ×3 IMPLANT
NDL PERC 18GX7CM (NEEDLE) IMPLANT
NEEDLE PERC 18GX7CM (NEEDLE) IMPLANT
PACK CARDIAC CATH (CUSTOM PROCEDURE TRAY) ×3 IMPLANT
SHEATH AVANTI 5FR X 11CM (SHEATH) IMPLANT
STENT RESOLUTE ONYX3.0X38 (Permanent Stent) ×2 IMPLANT
WIRE GUIDERIGHT .035X150 (WIRE) IMPLANT
WIRE RUNTHROUGH .014X180CM (WIRE) ×2 IMPLANT

## 2019-09-08 NOTE — H&P (View-Only) (Signed)
Cardiology Consultation:   Patient ID: Todd Gaines; [id number]; [birth year]   Admit date: 09/07/2019 Date of Consult: 09/08/2019  Primary Care Provider: Abner Greenspan, MD Primary Cardiologist: New to [facility] - consult by End Primary Electrophysiologist:  None   Patient Profile:   Todd Gaines is a 44 y.o. male with a hx of IDDM with last A1c of 8.7 from 06/2019, HLD, and psoriasis who is being seen today for the evaluation of NSTEMI at the request of Dr. Posey Pronto.  History of Present Illness:   Todd Gaines has no previously known cardiac history.  He has a remote history of mild tobacco use in his teenage years.  He was in his usual state of health until the morning of 6/24 when he was woken up by a headache.  He got up to take some ibuprofen and noted that he had substernal chest pressure without radiation.  Initially, he thought this was related to reflux as he had had some fried chicke late for dinner the night prior.  He did take some Tums and belched wi some mild relief though symptoms of chest pressure returned.  At its worst chest pressure was rated a 4 or 5 out of 10.  There were no associated symptoms.  His chest pressure was constant.  The ibuprofen did help with his headache though did not improve his chest pressure.  Due to persistent symptoms he presented to [facility] on the evening of 6/24 where he was noted to have stable vital signs.  Initial EKG shows sinus arrhythmia, 84 bpm, early repolarization normality, poor R wave progression along the precordial leads, otherwise no acute ST-T changes.  Initial high-sensitivity troponin of 1129 with a delta of 1557 currently trended to 2831.  Chest x-ray nonacute.  Initial WBC 12.2 subsequently improved to 9.8.  Hgb 15.3.  PLT 282.  BUN/SCr 16/0.96.  Potassium 4.4.  TC 123, TG 229, HDL 27, LDL 50.  Lipase and LFT normal.  COVID-19 negative.  In the ED he was given ASA 324 mg x 1, loaded with heparin and placed on heparin drip.  Currently, he  continues to note mild chest pressure rated a 2 out of 10.   Past Medical History:  Diagnosis Date  . Diabetes mellitus without complication ([place])   . Elevated cholesterol   . Psoriasis     History reviewed. No pertinent surgical history.   Home Meds: Prior to Admission medications   Medication Sig Start Date End Date Taking? Authorizing Provider  atorvastatin (LIPITOR) 10 MG tablet Take 1 tablet (10 mg total) by mouth daily. 06/27/19  Yes Tower, Wynelle Fanny, MD  insulin lispro (HUMALOG) 100 UNIT/ML injection Use max of 55 units daily via insulin pump. 09/07/19  Yes Elayne Snare, MD  INVOKANA 300 MG TABS tablet TAKE ONE TABLET BY MOUTH DAILY BEFORE BREAKFAST Patient taking differently: Take 300 mg by mouth daily before breakfast.  07/07/19  Yes Elayne Snare, MD  metFORMIN (GLUCOPHAGE-XR) 500 MG 24 hr tablet Take 1,500 mg by mouth daily with breakfast. Take 3 tablet (1528m total) by mouth daily.   Yes [provider]  Ustekinumab (STELARA [place]) Inject into the skin.   Yes [provider]  VICTOZA 18 MG/3ML SOPN DIAL AND INJECT UNDER THE SKIN 1.8 MG DAILY 07/07/19  Yes KElayne Snare MD  Blood Glucose Monitoring Suppl (CONTOUR NEXT ONE) KIT 1 each by Does not apply route in the morning, at noon, in the evening, and at bedtime. Use  Bayer Contour Next One meter to check blood sugar 4 times daily.    [provider]  glucose blood (ONETOUCH VERIO) test strip 1 each by Other route 4 (four) times daily. E11.65 09/04/19   Kumar, Ajay, MD  Insulin Disposable Pump (OMNIPOD DASH 5 PACK PODS) MISC Apply one pod to body once every 3 days for insulin delivery. 07/17/19   Kumar, Ajay, MD  Insulin Pen Needle (NOVOTWIST) 32G X 5 MM MISC Use one per day to inject victoza 09/04/19   Kumar, Ajay, MD    Inpatient Medications: Scheduled Meds: . aspirin EC  81 mg Oral Daily  . atorvastatin  10 mg Oral Daily  . insulin aspart  0-15 Units Subcutaneous TID WC  . sodium chloride flush  3 mL  Intravenous Q12H   Continuous Infusions: . sodium chloride    . [START ON 09/09/2019] sodium chloride     Followed by  . [START ON 09/09/2019] sodium chloride    . heparin 1,500 Units/hr (09/08/19 0515)   PRN Meds: sodium chloride, acetaminophen, nitroGLYCERIN, ondansetron (ZOFRAN) IV, sodium chloride flush  Allergies:  No Known Allergies  Social History:   Social History   Socioeconomic History  . Marital status: Married    Spouse name: Not on file  . Number of children: Not on file  . Years of education: Not on file  . Highest education level: Not on file  Occupational History  . Not on file  Tobacco Use  . Smoking status: Former Smoker  . Smokeless tobacco: Never Used  Substance and Sexual Activity  . Alcohol use: No    Alcohol/week: 0.0 standard drinks  . Drug use: No  . Sexual activity: Yes  Other Topics Concern  . Not on file  Social History Narrative  . Not on file   Social Determinants of Health   Financial Resource Strain:   . Difficulty of Paying Living Expenses:   Food Insecurity:   . Worried About Running Out of Food in the Last Year:   . Ran Out of Food in the Last Year:   Transportation Needs:   . Lack of Transportation (Medical):   . Lack of Transportation (Non-Medical):   Physical Activity:   . Days of Exercise per Week:   . Minutes of Exercise per Session:   Stress:   . Feeling of Stress :   Social Connections:   . Frequency of Communication with Friends and Family:   . Frequency of Social Gatherings with Friends and Family:   . Attends Religious Services:   . Active Member of Clubs or Organizations:   . Attends Club or Organization Meetings:   . Marital Status:   Intimate Partner Violence:   . Fear of Current or Ex-Partner:   . Emotionally Abused:   . Physically Abused:   . Sexually Abused:      Family History:   Family History  Problem Relation Age of Onset  . Diabetes Mother   . Diabetes Paternal Grandfather   . Hypertension  Paternal Grandfather   . Heart disease Neg Hx   . Cancer Neg Hx     ROS:  Review of Systems  Constitutional: Negative for chills, diaphoresis, fever, malaise/fatigue and weight loss.  HENT: Negative for congestion.   Eyes: Negative for discharge and redness.  Respiratory: Negative for cough, hemoptysis, sputum production, shortness of breath and wheezing.   Cardiovascular: Positive for chest pain. Negative for palpitations, orthopnea, claudication, leg swelling and PND.         Substernal chest pressure  Gastrointestinal: Positive for heartburn. Negative for abdominal pain, blood in stool, melena, nausea and vomiting.  Genitourinary: Negative for hematuria.  Musculoskeletal: Negative for falls and myalgias.  Skin: Negative for rash.  Neurological: Positive for headaches. Negative for dizziness, tingling, tremors, sensory change, speech change, focal weakness, loss of consciousness and weakness.  Endo/Heme/Allergies: Does not bruise/bleed easily.  Psychiatric/Behavioral: Negative for substance abuse. The patient is not nervous/anxious.   All other systems reviewed and are negative.     Physical Exam/Data:   Vitals:   09/08/19 0129 09/08/19 0131 09/08/19 0526 09/08/19 0737  BP: 127/83  111/73   Pulse: 74  83   Resp: 16     Temp: 98.6 F (37 C)  98.7 F (37.1 C)   TempSrc: Oral     SpO2: 99%  97%   Weight:  120.6 kg  120.6 kg  Height:  6' (1.829 m)      Intake/Output Summary (Last 24 hours) at 09/08/2019 0813 Last data filed at 09/08/2019 0603 Gross per 24 hour  Intake 122.22 ml  Output 0 ml  Net 122.22 ml   Filed Weights   09/07/19 1814 09/08/19 0131 09/08/19 0737  Weight: 122.5 kg 120.6 kg 120.6 kg   Body mass index is 36.06 kg/m.   Physical Exam: General: Well developed, well nourished, in no acute distress. Head: Normocephalic, atraumatic, sclera non-icteric, no xanthomas, nares without discharge.  Neck: Negative for carotid bruits. JVD not elevated. Lungs: Clear  bilaterally to auscultation without wheezes, rales, or rhonchi. Breathing is unlabored. Heart: RRR with S1 S2. No murmurs, rubs, or gallops appreciated. Abdomen: Soft, non-tender, non-distended with normoactive bowel sounds. No hepatomegaly. No rebound/guarding. No obvious abdominal masses. Msk:  Strength and tone appear normal for age. Extremities: No clubbing or cyanosis. No edema. Distal pedal pulses are 2+ and equal bilaterally. Neuro: Alert and oriented X 3. No facial asymmetry. No focal deficit. Moves all extremities spontaneously. Psych:  Responds to questions appropriately with a normal affect.   EKG:  The EKG was personally reviewed and demonstrates: sinus arrhythmia, 84 bpm, early repolarization normality, poor R wave progression along the precordial leads, otherwise no acute ST-T changes Telemetry:  Telemetry was personally reviewed and demonstrates: SR  Weights: Filed Weights   09/07/19 1814 09/08/19 0131 09/08/19 0737  Weight: 122.5 kg 120.6 kg 120.6 kg    Relevant CV Studies: None to date.   Laboratory Data:  Chemistry Recent Labs  Lab 09/07/19 1815 09/08/19 0249  NA 139 140  K 4.4 4.0  CL 100 102  CO2 27 28  GLUCOSE 120* 141*  BUN 16 16  CREATININE 0.86 0.96  CALCIUM 9.7 8.8*  GFRNONAA >60 >60  GFRAA >60 >60  ANIONGAP 12 10    Recent Labs  Lab 09/07/19 1815  PROT 7.0  ALBUMIN 3.9  AST 26  ALT 11  ALKPHOS 74  BILITOT 0.8   Hematology Recent Labs  Lab 09/07/19 1815 09/08/19 0249  WBC 12.2* 9.8  RBC 5.44 5.15  HGB 15.3 14.7  HCT 45.6 43.3  MCV 83.8 84.1  MCH 28.1 28.5  MCHC 33.6 33.9  RDW 12.7 12.6  PLT 282 242   Cardiac EnzymesNo results for input(s): TROPONINI in the last 168 hours. No results for input(s): TROPIPOC in the last 168 hours.  BNPNo results for input(s): BNP, PROBNP in the last 168 hours.  DDimer No results for input(s): DDIMER in the last 168 hours.  Radiology/Studies:  DG Chest 2 View    Result Date:  09/07/2019 IMPRESSION: Normal exam. Electronically Signed   By: James  Maxwell M.D.   On: 09/07/2019 18:49    Assessment and Plan:   1.  NSTEMI: -Continues to note mild chest pressure -Initial high-sensitivity troponin 1129 with a delta of 1557 currently trended to 2831 -ASA -Heparin drip -As needed SL NTG and morphine -N.p.o. -Echo pending -LHC today -Risks and benefits of cardiac catheterization have been discussed with the patient including risks of bleeding, bruising, infection, kidney damage, stroke, heart attack, urgent need for bypass, injury to limb, and death. The patient understands these risks and is willing to proceed with the procedure. All questions have been answered and concerns listened to  2.  HLD: -LDL this admission of 50 with goal less than 70 -Titrate atorvastatin to 80 mg daily given NSTEMI  3.  IDDM: -Last A1c 8.7 from 06/2019 -SSI per IM   For questions or updates, please contact [facility] Please consult [email or web address] for contact info under Cardiology/STEMI.   Signed, Jahziel Sinn, PA-C [facility] Pager: [phone] 09/08/2019, 8:13 AM    

## 2019-09-08 NOTE — Progress Notes (Signed)
*  PRELIMINARY RESULTS* Echocardiogram 2D Echocardiogram has been performed.  Joanette Gula Jasani Lengel 09/08/2019, 9:37 AM

## 2019-09-08 NOTE — Consult Note (Signed)
Cardiology Consultation:   Patient ID: Todd Gaines; [id number]; [birth year]   Admit date: 09/07/2019 Date of Consult: 09/08/2019  Primary Care Provider: Abner Greenspan, MD Primary Cardiologist: New to [facility] - consult by End Primary Electrophysiologist:  None   Patient Profile:   Todd Gaines is a 44 y.o. male with a hx of IDDM with last A1c of 8.7 from 06/2019, HLD, and psoriasis who is being seen today for the evaluation of NSTEMI at the request of Dr. Posey Pronto.  History of Present Illness:   Todd Gaines has no previously known cardiac history.  He has a remote history of mild tobacco use in his teenage years.  He was in his usual state of health until the morning of 6/24 when he was woken up by a headache.  He got up to take some ibuprofen and noted that he had substernal chest pressure without radiation.  Initially, he thought this was related to reflux as he had had some fried chicke late for dinner the night prior.  He did take some Tums and belched wi some mild relief though symptoms of chest pressure returned.  At its worst chest pressure was rated a 4 or 5 out of 10.  There were no associated symptoms.  His chest pressure was constant.  The ibuprofen did help with his headache though did not improve his chest pressure.  Due to persistent symptoms he presented to [facility] on the evening of 6/24 where he was noted to have stable vital signs.  Initial EKG shows sinus arrhythmia, 84 bpm, early repolarization normality, poor R wave progression along the precordial leads, otherwise no acute ST-T changes.  Initial high-sensitivity troponin of 1129 with a delta of 1557 currently trended to 2831.  Chest x-ray nonacute.  Initial WBC 12.2 subsequently improved to 9.8.  Hgb 15.3.  PLT 282.  BUN/SCr 16/0.96.  Potassium 4.4.  TC 123, TG 229, HDL 27, LDL 50.  Lipase and LFT normal.  COVID-19 negative.  In the ED he was given ASA 324 mg x 1, loaded with heparin and placed on heparin drip.  Currently, he  continues to note mild chest pressure rated a 2 out of 10.   Past Medical History:  Diagnosis Date  . Diabetes mellitus without complication ([place])   . Elevated cholesterol   . Psoriasis     History reviewed. No pertinent surgical history.   Home Meds: Prior to Admission medications   Medication Sig Start Date End Date Taking? Authorizing Provider  atorvastatin (LIPITOR) 10 MG tablet Take 1 tablet (10 mg total) by mouth daily. 06/27/19  Yes Tower, Wynelle Fanny, MD  insulin lispro (HUMALOG) 100 UNIT/ML injection Use max of 55 units daily via insulin pump. 09/07/19  Yes Elayne Snare, MD  INVOKANA 300 MG TABS tablet TAKE ONE TABLET BY MOUTH DAILY BEFORE BREAKFAST Patient taking differently: Take 300 mg by mouth daily before breakfast.  07/07/19  Yes Elayne Snare, MD  metFORMIN (GLUCOPHAGE-XR) 500 MG 24 hr tablet Take 1,500 mg by mouth daily with breakfast. Take 3 tablet (1528m total) by mouth daily.   Yes [provider]  Ustekinumab (STELARA [place]) Inject into the skin.   Yes [provider]  VICTOZA 18 MG/3ML SOPN DIAL AND INJECT UNDER THE SKIN 1.8 MG DAILY 07/07/19  Yes KElayne Snare MD  Blood Glucose Monitoring Suppl (CONTOUR NEXT ONE) KIT 1 each by Does not apply route in the morning, at noon, in the evening, and at bedtime. Use  Bayer Contour Next One meter to check blood sugar 4 times daily.    [provider]  glucose blood (ONETOUCH VERIO) test strip 1 each by Other route 4 (four) times daily. E11.65 09/04/19   Kumar, Ajay, MD  Insulin Disposable Pump (OMNIPOD DASH 5 PACK PODS) MISC Apply one pod to body once every 3 days for insulin delivery. 07/17/19   Kumar, Ajay, MD  Insulin Pen Needle (NOVOTWIST) 32G X 5 MM MISC Use one per day to inject victoza 09/04/19   Kumar, Ajay, MD    Inpatient Medications: Scheduled Meds: . aspirin EC  81 mg Oral Daily  . atorvastatin  10 mg Oral Daily  . insulin aspart  0-15 Units Subcutaneous TID WC  . sodium chloride flush  3 mL  Intravenous Q12H   Continuous Infusions: . sodium chloride    . [START ON 09/09/2019] sodium chloride     Followed by  . [START ON 09/09/2019] sodium chloride    . heparin 1,500 Units/hr (09/08/19 0515)   PRN Meds: sodium chloride, acetaminophen, nitroGLYCERIN, ondansetron (ZOFRAN) IV, sodium chloride flush  Allergies:  No Known Allergies  Social History:   Social History   Socioeconomic History  . Marital status: Married    Spouse name: Not on file  . Number of children: Not on file  . Years of education: Not on file  . Highest education level: Not on file  Occupational History  . Not on file  Tobacco Use  . Smoking status: Former Smoker  . Smokeless tobacco: Never Used  Substance and Sexual Activity  . Alcohol use: No    Alcohol/week: 0.0 standard drinks  . Drug use: No  . Sexual activity: Yes  Other Topics Concern  . Not on file  Social History Narrative  . Not on file   Social Determinants of Health   Financial Resource Strain:   . Difficulty of Paying Living Expenses:   Food Insecurity:   . Worried About Running Out of Food in the Last Year:   . Ran Out of Food in the Last Year:   Transportation Needs:   . Lack of Transportation (Medical):   . Lack of Transportation (Non-Medical):   Physical Activity:   . Days of Exercise per Week:   . Minutes of Exercise per Session:   Stress:   . Feeling of Stress :   Social Connections:   . Frequency of Communication with Friends and Family:   . Frequency of Social Gatherings with Friends and Family:   . Attends Religious Services:   . Active Member of Clubs or Organizations:   . Attends Club or Organization Meetings:   . Marital Status:   Intimate Partner Violence:   . Fear of Current or Ex-Partner:   . Emotionally Abused:   . Physically Abused:   . Sexually Abused:      Family History:   Family History  Problem Relation Age of Onset  . Diabetes Mother   . Diabetes Paternal Grandfather   . Hypertension  Paternal Grandfather   . Heart disease Neg Hx   . Cancer Neg Hx     ROS:  Review of Systems  Constitutional: Negative for chills, diaphoresis, fever, malaise/fatigue and weight loss.  HENT: Negative for congestion.   Eyes: Negative for discharge and redness.  Respiratory: Negative for cough, hemoptysis, sputum production, shortness of breath and wheezing.   Cardiovascular: Positive for chest pain. Negative for palpitations, orthopnea, claudication, leg swelling and PND.         Substernal chest pressure  Gastrointestinal: Positive for heartburn. Negative for abdominal pain, blood in stool, melena, nausea and vomiting.  Genitourinary: Negative for hematuria.  Musculoskeletal: Negative for falls and myalgias.  Skin: Negative for rash.  Neurological: Positive for headaches. Negative for dizziness, tingling, tremors, sensory change, speech change, focal weakness, loss of consciousness and weakness.  Endo/Heme/Allergies: Does not bruise/bleed easily.  Psychiatric/Behavioral: Negative for substance abuse. The patient is not nervous/anxious.   All other systems reviewed and are negative.     Physical Exam/Data:   Vitals:   09/08/19 0129 09/08/19 0131 09/08/19 0526 09/08/19 0737  BP: 127/83  111/73   Pulse: 74  83   Resp: 16     Temp: 98.6 F (37 C)  98.7 F (37.1 C)   TempSrc: Oral     SpO2: 99%  97%   Weight:  120.6 kg  120.6 kg  Height:  6' (1.829 m)      Intake/Output Summary (Last 24 hours) at 09/08/2019 0813 Last data filed at 09/08/2019 0603 Gross per 24 hour  Intake 122.22 ml  Output 0 ml  Net 122.22 ml   Filed Weights   09/07/19 1814 09/08/19 0131 09/08/19 0737  Weight: 122.5 kg 120.6 kg 120.6 kg   Body mass index is 36.06 kg/m.   Physical Exam: General: Well developed, well nourished, in no acute distress. Head: Normocephalic, atraumatic, sclera non-icteric, no xanthomas, nares without discharge.  Neck: Negative for carotid bruits. JVD not elevated. Lungs: Clear  bilaterally to auscultation without wheezes, rales, or rhonchi. Breathing is unlabored. Heart: RRR with S1 S2. No murmurs, rubs, or gallops appreciated. Abdomen: Soft, non-tender, non-distended with normoactive bowel sounds. No hepatomegaly. No rebound/guarding. No obvious abdominal masses. Msk:  Strength and tone appear normal for age. Extremities: No clubbing or cyanosis. No edema. Distal pedal pulses are 2+ and equal bilaterally. Neuro: Alert and oriented X 3. No facial asymmetry. No focal deficit. Moves all extremities spontaneously. Psych:  Responds to questions appropriately with a normal affect.   EKG:  The EKG was personally reviewed and demonstrates: sinus arrhythmia, 84 bpm, early repolarization normality, poor R wave progression along the precordial leads, otherwise no acute ST-T changes Telemetry:  Telemetry was personally reviewed and demonstrates: SR  Weights: Filed Weights   09/07/19 1814 09/08/19 0131 09/08/19 0737  Weight: 122.5 kg 120.6 kg 120.6 kg    Relevant CV Studies: None to date.   Laboratory Data:  Chemistry Recent Labs  Lab 09/07/19 1815 09/08/19 0249  NA 139 140  K 4.4 4.0  CL 100 102  CO2 27 28  GLUCOSE 120* 141*  BUN 16 16  CREATININE 0.86 0.96  CALCIUM 9.7 8.8*  GFRNONAA >60 >60  GFRAA >60 >60  ANIONGAP 12 10    Recent Labs  Lab 09/07/19 1815  PROT 7.0  ALBUMIN 3.9  AST 26  ALT 11  ALKPHOS 74  BILITOT 0.8   Hematology Recent Labs  Lab 09/07/19 1815 09/08/19 0249  WBC 12.2* 9.8  RBC 5.44 5.15  HGB 15.3 14.7  HCT 45.6 43.3  MCV 83.8 84.1  MCH 28.1 28.5  MCHC 33.6 33.9  RDW 12.7 12.6  PLT 282 242   Cardiac EnzymesNo results for input(s): TROPONINI in the last 168 hours. No results for input(s): TROPIPOC in the last 168 hours.  BNPNo results for input(s): BNP, PROBNP in the last 168 hours.  DDimer No results for input(s): DDIMER in the last 168 hours.  Radiology/Studies:  DG Chest 2 View    Result Date:  09/07/2019 IMPRESSION: Normal exam. Electronically Signed   By: Lorriane Shire M.D.   On: 09/07/2019 18:49    Assessment and Plan:   1.  NSTEMI: -Continues to note mild chest pressure -Initial high-sensitivity troponin 1129 with a delta of 1557 currently trended to 2831 -ASA -Heparin drip -As needed SL NTG and morphine -N.p.o. -Echo pending -LHC today -Risks and benefits of cardiac catheterization have been discussed with the patient including risks of bleeding, bruising, infection, kidney damage, stroke, heart attack, urgent need for bypass, injury to limb, and death. The patient understands these risks and is willing to proceed with the procedure. All questions have been answered and concerns listened to  2.  HLD: -LDL this admission of 50 with goal less than 70 -Titrate atorvastatin to 80 mg daily given NSTEMI  3.  IDDM: -Last A1c 8.7 from 06/2019 -SSI per IM   For questions or updates, please contact [place] Please consult [email or web address] for contact info under Cardiology/STEMI.   Signed, Christell Faith, PA-C [place] Pager: [phone] 09/08/2019, 8:13 AM

## 2019-09-08 NOTE — Telephone Encounter (Signed)
Noted, patient currently admitted to [facility] for NSTEMI.

## 2019-09-08 NOTE — Brief Op Note (Signed)
BRIEF CARDIAC CATHETERIZATION NOTE  DATE: 09/08/2019  TIME: 11:16 AM  PATIENT:  Cathie Beams  44 y.o. male  PRE-OPERATIVE DIAGNOSIS:  non ST segment myocardial infarction  POST-OPERATIVE DIAGNOSIS:  Same  PROCEDURE:  Procedure(s): LEFT HEART CATH AND CORONARY ANGIOGRAPHY (N/A) CORONARY STENT INTERVENTION (N/A)  SURGEON:  Surgeon(s) and Role:    * Haven Pylant, Cristal Deer, MD - Primary  FINDINGS: 1. Severe single-vessel CAD with thrombotic occlusion of the distal RCA. 2. Moderate mid LCx stenosis. 3. Mildly elevated left ventricular filling pressure. 4. Successful PCI to distal RCA using Resolute Onyx 3.0 x 38 mm drug-eluting stent with 0% residual stenosis and TIMI-3 flow.  RECOMMENDATIONS: 1. Dual antiplatelet therapy with aspirin and prasugrel for 12 months. 2. Medical therapy of moderate LCx stenosis.  Consider functional study as an outpatient if patient has recurrent chest pain. 3. Aggressive secondary prevention.  Yvonne Kendall, MD [facility]

## 2019-09-08 NOTE — Consult Note (Addendum)
ANTICOAGULATION CONSULT NOTE - Initial Consult  Pharmacy Consult for Heparin Infusion Indication: chest pain/ACS  No Known Allergies  Patient Measurements: Height: 6' (182.9 cm) Weight: 120.6 kg (265 lb 12.8 oz) IBW/kg (Calculated) : 77.6 Heparin Dosing Weight: 105.6 kg  Vital Signs: Temp: 98.6 F (37 C) (06/25 0129) Temp Source: Oral (06/25 0129) BP: 127/83 (06/25 0129) Pulse Rate: 74 (06/25 0129)  Labs: Recent Labs    09/07/19 1815 09/07/19 1949 09/07/19 2037 09/07/19 2331 09/08/19 0249  HGB 15.3  --   --   --  14.7  HCT 45.6  --   --   --  43.3  PLT 282  --   --   --  242  APTT  --  31  --   --   --   LABPROT  --  12.6  --   --   --   INR  --  1.0  --   --   --   HEPARINUNFRC  --   --   --   --  0.15*  CREATININE 0.86  --   --   --  0.96  TROPONINIHS 1,129*  --  1,557* 2,057*  --     Estimated Creatinine Clearance: 131.7 mL/min (by C-G formula based on SCr of 0.96 mg/dL).   Medical History: Past Medical History:  Diagnosis Date  . Diabetes mellitus without complication ([place])   . Elevated cholesterol   . Psoriasis     Medications:  Medications Prior to Admission  Medication Sig Dispense Refill Last Dose  . atorvastatin (LIPITOR) 10 MG tablet Take 1 tablet (10 mg total) by mouth daily. 30 tablet 11 09/06/2019 at 2000  . insulin lispro (HUMALOG) 100 UNIT/ML injection Use max of 55 units daily via insulin pump. 20 mL 2 09/07/2019 at 0800  . INVOKANA 300 MG TABS tablet TAKE ONE TABLET BY MOUTH DAILY BEFORE BREAKFAST (Patient taking differently: Take 300 mg by mouth daily before breakfast. ) 30 tablet 1 09/06/2019 at 2000  . metFORMIN (GLUCOPHAGE-XR) 500 MG 24 hr tablet Take 1,500 mg by mouth daily with breakfast. Take 3 tablet ('1500mg'$  total) by mouth daily.   09/06/2019 at 2000  . Ustekinumab (STELARA [place]) Inject into the skin.   Past Month at Unknown time  . VICTOZA 18 MG/3ML SOPN DIAL AND INJECT UNDER THE SKIN 1.8 MG DAILY 9 mL 1 09/07/2019 at 0800  . Blood  Glucose Monitoring Suppl (CONTOUR NEXT ONE) KIT 1 each by Does not apply route in the morning, at noon, in the evening, and at bedtime. Use Bayer Contour Next One meter to check blood sugar 4 times daily.     Marland Kitchen glucose blood (ONETOUCH VERIO) test strip 1 each by Other route 4 (four) times daily. E11.65 360 each 2   . Insulin Disposable Pump (OMNIPOD DASH 5 PACK PODS) MISC Apply one pod to body once every 3 days for insulin delivery. 30 each 1   . Insulin Pen Needle (NOVOTWIST) 32G X 5 MM MISC Use one per day to inject victoza 50 each 3    Scheduled:  . aspirin EC  81 mg Oral Daily  . atorvastatin  10 mg Oral Daily  . insulin aspart  0-15 Units Subcutaneous TID WC   Infusions:  . heparin 1,300 Units/hr (09/08/19 0300)   PRN:  Anti-infectives (From admission, onward)   None      Assessment: Pharmacy has been consulted to initiate heparin infusion in 44yo patient with no history of anticoagulant  use. Presented to ED with chest pain and new onset of chest tightness. Troponin level of 1,129. Baseline labs have been ordered and are pending.  0625 0249 HL 0.15, subtherapeutic, CBC stable.   Goal of Therapy:  Heparin level 0.3-0.7 units/ml Monitor platelets by anticoagulation protocol: Yes   Plan:  Give 4000 units bolus x 1 Start heparin infusion at 1300 units/hr Check anti-Xa level in 6 hours and daily while on heparin Continue to monitor H&H and platelets   HL subtherapeutic, will rebolus w/ 2000 units x 1 and increase heparin infusion to 1500 units/hr.  Recheck HL 6 hours after rate increase.  Hart Robinsons A 09/08/2019,4:08 AM

## 2019-09-08 NOTE — Interval H&P Note (Signed)
History and Physical Interval Note:  09/08/2019 10:16 AM  Todd Gaines  has presented today for surgery, with the diagnosis of non ST segment myocardial infarction.  The various methods of treatment have been discussed with the patient and family. After consideration of risks, benefits and other options for treatment, the patient has consented to  Procedure(s): LEFT HEART CATH AND CORONARY ANGIOGRAPHY (N/A) as a surgical intervention.  The patient's history has been reviewed, patient examined, no change in status, stable for surgery.  I have reviewed the patient's chart and labs.  Questions were answered to the patient's satisfaction.    Cath Lab Visit (complete for each Cath Lab visit)  Clinical Evaluation Leading to the Procedure:   ACS: Yes.    Non-ACS:  N/A  Todd Gaines

## 2019-09-08 NOTE — Progress Notes (Signed)
Received patient from ED via stretcher at approximately 0115. Patient awake, alert and oriented, and in NAD. Denies pain; heparin infusing at 1300 units per hour. Oriented to room and standing weight obtained. Pt advised to remain NPO per note of Dr. Allena Katz. Admission profile was completed.

## 2019-09-09 ENCOUNTER — Encounter: Payer: Self-pay | Admitting: Physician Assistant

## 2019-09-09 LAB — GLUCOSE, CAPILLARY: Glucose-Capillary: 120 mg/dL — ABNORMAL HIGH (ref 70–99)

## 2019-09-09 LAB — BASIC METABOLIC PANEL
Anion gap: 9 (ref 5–15)
BUN: 16 mg/dL (ref 6–20)
CO2: 28 mmol/L (ref 22–32)
Calcium: 8.7 mg/dL — ABNORMAL LOW (ref 8.9–10.3)
Chloride: 103 mmol/L (ref 98–111)
Creatinine, Ser: 0.91 mg/dL (ref 0.61–1.24)
GFR calc Af Amer: >60 mL/min (ref >60)
GFR calc non Af Amer: >60 mL/min (ref >60)
Glucose, Bld: 140 mg/dL — ABNORMAL HIGH (ref 70–99)
Potassium: 4.2 mmol/L (ref 3.5–5.1)
Sodium: 140 mmol/L (ref 135–145)

## 2019-09-09 LAB — CBC
HCT: 44.7 % (ref 39.0–52.0)
Hemoglobin: 15 g/dL (ref 13.0–17.0)
MCH: 28.4 pg (ref 26.0–34.0)
MCHC: 33.6 g/dL (ref 30.0–36.0)
MCV: 84.7 fL (ref 80.0–100.0)
Platelets: 260 10*3/uL (ref 150–400)
RBC: 5.28 MIL/uL (ref 4.22–5.81)
RDW: 12.7 % (ref 11.5–15.5)
WBC: 9.4 10*3/uL (ref 4.0–10.5)
nRBC: 0 % (ref 0.0–0.2)

## 2019-09-09 LAB — MAGNESIUM: Magnesium: 2.1 mg/dL (ref 1.7–2.4)

## 2019-09-09 MED ORDER — ASPIRIN 81 MG PO TBEC: 81.0000 mg | DELAYED_RELEASE_TABLET | Freq: Every day | ORAL | 11 refills | Status: AC

## 2019-09-09 MED ORDER — NITROGLYCERIN 0.4 MG SL SUBL: 0.4000 mg | SUBLINGUAL_TABLET | SUBLINGUAL | 12 refills | Status: AC | PRN

## 2019-09-09 MED ORDER — METOPROLOL TARTRATE 25 MG PO TABS
12.5000 mg | ORAL_TABLET | Freq: Two times a day (BID) | ORAL | Status: DC
  Administered 2019-09-09: 12.5 mg via ORAL
  Filled 2019-09-09: qty 1

## 2019-09-09 MED ORDER — METOPROLOL TARTRATE 25 MG PO TABS: 25.0000 mg | ORAL_TABLET | Freq: Two times a day (BID) | ORAL | 2 refills | Status: DC

## 2019-09-09 MED ORDER — PRASUGREL HCL 10 MG PO TABS: 10.0000 mg | ORAL_TABLET | Freq: Every day | ORAL | 11 refills | Status: DC

## 2019-09-09 MED ORDER — ATORVASTATIN CALCIUM 80 MG PO TABS: 80.0000 mg | ORAL_TABLET | Freq: Every day | ORAL | 2 refills | Status: DC

## 2019-09-09 NOTE — Discharge Summary (Signed)
Physician Discharge Summary   TARUS BRISKI  male DOB: [birth year]  ZRA:[id number]  PCP: Abner Greenspan, MD  Admit date: 09/07/2019 Discharge date: 09/09/2019  Admitted From: home Disposition:  home CODE STATUS: Full code  Discharge Instructions    AMB Referral to Cardiac Rehabilitation - Phase II   Complete by: As directed    Diagnosis:  Coronary Stents NSTEMI     After initial evaluation and assessments completed: Virtual Based Care may be provided alone or in conjunction with Phase 2 Cardiac Rehab based on patient barriers.: Yes   Discharge instructions   Complete by: As directed    Please take Aspirin 81 mg and Effient together for 1 year, last day 09/08/20, to keep your stent open.  Dr. Enzo Bi [facility] Course:  For full details, please see H&P, progress notes, consult notes and ancillary notes.  Briefly,  ODIE EDMONDS is a 44 y.o. Caucasian male with medical history significant for insulin-dependent type 2 diabetes, hypertension, hyperlipidemia, and psoriasis who presented to the ED for evaluation of chest pain.  NSTEMI Patient with new onset pressure-like midsternal chest pain with elevated high-sensitivity troponin 1129.  T wave changes noted in the inferior leads on repeat EKG.  Pt was started on heparin gtt and cardiology consulted who took pt for LHC, and received PCI/DES to the RCA with residual LCx disease to be medically managed.  Pt was started on metop and will take ASA 81 and Effient for 1 year without interruption.  LDL 50 during this admission, pt was already on Lipitor, however, given NSTEMI, lipitor was increased to 80 mg daily per cards.  Pt will follow up with cardiology 7-14 days after discharge.    Insulin-dependent type 2 diabetes: Last A1c 8.7% on 07/03/2019.  Home regimen includes Metformin, Invokana, Victoza, and recently started insulin pump.  Pt discharged back on home regimen.  Hypertension: Previously on lisinopril which was  discontinued due to improved control after starting Invokana.  Pt was started on metop during this admission, discharged on 25 mg BID.  Hyperlipidemia: Continued atorvastatin at higher dose of 80 mg daily   Discharge Diagnoses:  Principal Problem:   NSTEMI (non-ST elevated myocardial infarction) ([place]) Active Problems:   Diabetes mellitus type 2, uncontrolled ([place])   Hyperlipidemia associated with type 2 diabetes mellitus ([place])   Essential hypertension, benign    Discharge Instructions:  Allergies as of 09/09/2019   No Known Allergies     Medication List    TAKE these medications   aspirin 81 MG EC tablet Take 1 tablet (81 mg total) by mouth daily. Swallow whole. Start taking on: September 10, 2019   atorvastatin 80 MG tablet Commonly known as: LIPITOR Take 1 tablet (80 mg total) by mouth daily. What changed:   medication strength  how much to take   Contour Next One Kit 1 each by Does not apply route in the morning, at noon, in the evening, and at bedtime. Use Bayer Contour Next One meter to check blood sugar 4 times daily.   insulin lispro 100 UNIT/ML injection Commonly known as: HumaLOG Use max of 55 units daily via insulin pump.   Invokana 300 MG Tabs tablet Generic drug: canagliflozin TAKE ONE TABLET BY MOUTH DAILY BEFORE BREAKFAST What changed: See the new instructions.   metFORMIN 500 MG 24 hr tablet Commonly known as: GLUCOPHAGE-XR Take 1,500 mg by mouth daily with breakfast. Take 3 tablet (1563m total) by  mouth daily.   metoprolol tartrate 25 MG tablet Commonly known as: LOPRESSOR Take 1 tablet (25 mg total) by mouth 2 (two) times daily.   nitroGLYCERIN 0.4 MG SL tablet Commonly known as: NITROSTAT Place 1 tablet (0.4 mg total) under the tongue every 5 (five) minutes as needed for chest pain.   NovoTwist 32G X 5 MM Misc Generic drug: Insulin Pen Needle Use one per day to inject victoza   OmniPod Dash 5 Pack Pods Misc Apply one pod to body once  every 3 days for insulin delivery.   OneTouch Verio test strip Generic drug: glucose blood 1 each by Other route 4 (four) times daily. E11.65   prasugrel 10 MG Tabs tablet Commonly known as: EFFIENT Take 1 tablet (10 mg total) by mouth daily. Last day 09/08/20. Start taking on: September 10, 2019   [facility] Inject into the skin.   Victoza 18 MG/3ML Sopn Generic drug: liraglutide DIAL AND INJECT UNDER THE SKIN 1.8 MG DAILY        Follow-up Information    Tower, Wynelle Fanny, MD. Schedule an appointment as soon as possible for a visit in 1 week(s).   Specialties: Family Medicine, Radiology Contact information: [place] [address] [phone]        Nelva Bush, MD. Schedule an appointment as soon as possible for a visit in 1 week(s).   Specialty: Cardiology Contact information: [place] [address] [phone]               No Known Allergies   The results of significant diagnostics from this hospitalization (including imaging, microbiology, ancillary and laboratory) are listed below for reference.   Consultations:   Procedures/Studies: DG Chest 2 View  Result Date: 09/07/2019 CLINICAL DATA:  Chest pain. EXAM: CHEST - 2 VIEW COMPARISON:  None. FINDINGS: The heart size and mediastinal contours are within normal limits. Both lungs are clear. The visualized skeletal structures are unremarkable. IMPRESSION: Normal exam. Electronically Signed   By: Lorriane Shire M.D.   On: 09/07/2019 18:49   CARDIAC CATHETERIZATION  Result Date: 09/08/2019 Conclusions: 1. Two-vessel coronary artery disease with 60% distal LCx stenosis and thrombotic occlusion of the distal RCA. 2. Mildly elevated left ventricular filling pressure (LVEDP ~20 mmHg). 3. Successful PCI to distal RCA using resolute Onyx 3.0 x 38 mm drug-eluting stent with 0% residual stenosis and TIMI-3 flow. Recommendations: 1. Dual antiplatelet therapy with aspirin  and prasugrel for 12 months. 2. Aggressive secondary prevention. 3. Favor medical therapy of moderate distal LCx disease.  If Mr. Pellum has recurrent chest pain, functional assessment of this lesion and PCI, if hemodynamically significant, will need to be considered. Nelva Bush, MD [facility]   ECHOCARDIOGRAM COMPLETE  Result Date: 09/08/2019    ECHOCARDIOGRAM REPORT   Patient Name:   FREDRIC SLABACH [facility] Date of Exam: 09/08/2019 Medical Rec #:  [id number]       Height:       72.0 in Accession #:    [id number]      Weight:       265.9 lb Date of Birth:  [birth year]       BSA:          2.405 m Patient Age:    44 years        BP:           121/77 mmHg Patient Gender: M  HR:           68 bpm. Exam Location:  [facility] Procedure: 2D Echo, Color Doppler and Cardiac Doppler Indications:     I21.4 NSTEMI  History:         Patient has no prior history of Echocardiogram examinations.                  Risk Factors:HCL and Diabetes.  Sonographer:     Charmayne Sheer RDCS (AE) Referring Phys:  [id number] Cleaster Corin PATEL Diagnosing Phys: Nelva Bush MD IMPRESSIONS  1. Left ventricular ejection fraction, by estimation, is 55 to 60%. The left ventricle has normal function. The left ventricle demonstrates regional wall motion abnormalities (see scoring diagram/findings for description). There is mild left ventricular  hypertrophy. Left ventricular diastolic parameters were normal. There is severe hypokinesis of the left ventricular, basal-mid inferior wall and inferoseptal wall.  2. Right ventricular systolic function is normal. The right ventricular size is normal. Tricuspid regurgitation signal is inadequate for assessing PA pressure.  3. The mitral valve is grossly normal. No evidence of mitral valve regurgitation. No evidence of mitral stenosis.  4. The aortic valve has an indeterminant number of cusps. Aortic valve regurgitation is not visualized. No aortic stenosis is present.  5. Aortic dilatation noted. There is  mild dilatation of the aortic root measuring 40 mm.  6. The inferior vena cava is normal in size with <50% respiratory variability, suggesting right atrial pressure of 8 mmHg. FINDINGS  Left Ventricle: Left ventricular ejection fraction, by estimation, is 55 to 60%. The left ventricle has normal function. The left ventricle demonstrates regional wall motion abnormalities. Severe hypokinesis of the left ventricular, basal-mid inferior wall and inferoseptal wall. The left ventricular internal cavity size was normal in size. There is mild left ventricular hypertrophy. Left ventricular diastolic parameters were normal. Right Ventricle: The right ventricular size is normal. No increase in right ventricular wall thickness. Right ventricular systolic function is normal. Tricuspid regurgitation signal is inadequate for assessing PA pressure. Left Atrium: Left atrial size was normal in size. Right Atrium: Right atrial size was normal in size. Pericardium: There is no evidence of pericardial effusion. Mitral Valve: The mitral valve is grossly normal. No evidence of mitral valve regurgitation. No evidence of mitral valve stenosis. MV peak gradient, 2.8 mmHg. The mean mitral valve gradient is 1.0 mmHg. Tricuspid Valve: The tricuspid valve is normal in structure. Tricuspid valve regurgitation is not demonstrated. Aortic Valve: The aortic valve has an indeterminant number of cusps. Aortic valve regurgitation is not visualized. No aortic stenosis is present. Aortic valve mean gradient measures 3.0 mmHg. Aortic valve peak gradient measures 6.5 mmHg. Aortic valve area, by VTI measures 3.89 cm. Pulmonic Valve: The pulmonic valve was normal in structure. Pulmonic valve regurgitation is not visualized. No evidence of pulmonic stenosis. Aorta: Aortic dilatation noted. There is mild dilatation of the aortic root measuring 40 mm. Pulmonary Artery: The pulmonary artery is not well seen. Venous: The inferior vena cava is normal in size  with less than 50% respiratory variability, suggesting right atrial pressure of 8 mmHg. IAS/Shunts: The interatrial septum was not well visualized.  LEFT VENTRICLE PLAX 2D LVIDd:         5.13 cm  Diastology LVIDs:         3.28 cm  LV e' lateral:   13.20 cm/s LV PW:         1.21 cm  LV E/e' lateral: 5.1 LV IVS:  1.25 cm  LV e' medial:    8.59 cm/s LVOT diam:     2.60 cm  LV E/e' medial:  7.9 LV SV:         96 LV SV Index:   40 LVOT Area:     5.31 cm  LEFT ATRIUM             Index       RIGHT ATRIUM          Index LA diam:        3.40 cm 1.41 cm/m  RA Area:     9.98 cm 4.15 cm/m LA Vol (A2C):   39.8 ml 16.55 ml/m LA Vol (A4C):   32.5 ml 13.52 ml/m LA Biplane Vol: 37.4 ml 15.55 ml/m  AORTIC VALVE                   PULMONIC VALVE AV Area (Vmax):    4.26 cm    PV Vmax:       1.10 m/s AV Area (Vmean):   3.90 cm    PV Vmean:      72.500 cm/s AV Area (VTI):     3.89 cm    PV VTI:        0.219 m AV Vmax:           127.00 cm/s PV Peak grad:  4.8 mmHg AV Vmean:          86.500 cm/s PV Mean grad:  2.0 mmHg AV VTI:            0.247 m AV Peak Grad:      6.5 mmHg AV Mean Grad:      3.0 mmHg LVOT Vmax:         102.00 cm/s LVOT Vmean:        63.500 cm/s LVOT VTI:          0.181 m LVOT/AV VTI ratio: 0.73  AORTA Ao Root diam: 4.00 cm MITRAL VALVE MV Area (PHT): 3.72 cm    SHUNTS MV Peak grad:  2.8 mmHg    Systemic VTI:  0.18 m MV Mean grad:  1.0 mmHg    Systemic Diam: 2.60 cm MV Vmax:       0.84 m/s MV Vmean:      52.4 cm/s MV Decel Time: 204 msec MV E velocity: 67.90 cm/s MV A velocity: 55.80 cm/s MV E/A ratio:  1.22 Harrell Gave End MD Electronically signed by Nelva Bush MD Signature Date/Time: 09/08/2019/3:20:39 PM    Final       Labs: BNP (last 3 results) No results for input(s): BNP in the last 8760 hours. Basic Metabolic Panel: Recent Labs  Lab 09/07/19 1815 09/08/19 0249 09/09/19 0441  NA 139 140 140  K 4.4 4.0 4.2  CL 100 102 103  CO2 _0 GLUCOSE 120* 141* 140*  BUN _1 CREATININE 0.86 0.96 0.91  CALCIUM 9.7 8.8* 8.7*  MG  --   --  2.1   Liver Function Tests: Recent Labs  Lab 09/07/19 1815  AST 26  ALT 11  ALKPHOS 74  BILITOT 0.8  PROT 7.0  ALBUMIN 3.9   Recent Labs  Lab 09/07/19 1815  LIPASE 25   No results for input(s): AMMONIA in the last 168 hours. CBC: Recent Labs  Lab 09/07/19 1815 09/08/19 0249 09/09/19 0441  WBC 12.2* 9.8 9.4  HGB 15.3 14.7 15.0  HCT 45.6 43.3 44.7  MCV 83.8  84.1 84.7  PLT 282 242 260   Cardiac Enzymes: No results for input(s): CKTOTAL, CKMB, CKMBINDEX, TROPONINI in the last 168 hours. BNP: Invalid input(s): POCBNP CBG: Recent Labs  Lab 09/08/19 1002 09/08/19 1129 09/08/19 1725 09/08/19 2101 09/09/19 0731  GLUCAP 119* 112* 175* 146* 120*   D-Dimer No results for input(s): DDIMER in the last 72 hours. Hgb A1c No results for input(s): HGBA1C in the last 72 hours. Lipid Profile Recent Labs    09/08/19 0249  CHOL 123  HDL 27*  LDLCALC 50  TRIG 229*  CHOLHDL 4.6   Thyroid function studies No results for input(s): TSH, T4TOTAL, T3FREE, THYROIDAB in the last 72 hours.  Invalid input(s): FREET3 Anemia work up No results for input(s): VITAMINB12, FOLATE, FERRITIN, TIBC, IRON, RETICCTPCT in the last 72 hours. Urinalysis    Component Value Date/Time   COLORURINE YELLOW 02/16/2017 1611   APPEARANCEUR CLEAR 02/16/2017 1611   LABSPEC >=1.030 (A) 02/16/2017 1611   PHURINE 6.0 02/16/2017 1611   GLUCOSEU 500 (A) 02/16/2017 1611   HGBUR NEGATIVE 02/16/2017 1611   BILIRUBINUR NEGATIVE 02/16/2017 1611   KETONESUR NEGATIVE 02/16/2017 1611   UROBILINOGEN 0.2 02/16/2017 1611   NITRITE NEGATIVE 02/16/2017 1611   LEUKOCYTESUR NEGATIVE 02/16/2017 1611   Sepsis Labs Invalid input(s): PROCALCITONIN,  WBC,  LACTICIDVEN Microbiology Recent Results (from the past 240 hour(s))  SARS Coronavirus 2 by RT PCR (hospital order, performed in [place] hospital lab) Nasopharyngeal Nasopharyngeal Swab      Status: None   Collection Time: 09/07/19  7:26 PM   Specimen: Nasopharyngeal Swab  Result Value Ref Range Status   SARS Coronavirus 2 NEGATIVE NEGATIVE Final    Comment: (NOTE) SARS-CoV-2 target nucleic acids are NOT DETECTED.  The SARS-CoV-2 RNA is generally detectable in upper and lower respiratory specimens during the acute phase of infection. The lowest concentration of SARS-CoV-2 viral copies this assay can detect is 250 copies / mL. A negative result does not preclude SARS-CoV-2 infection and should not be used as the sole basis for treatment or other patient management decisions.  A negative result may occur with improper specimen collection / handling, submission of specimen other than nasopharyngeal swab, presence of viral mutation(s) within the areas targeted by this assay, and inadequate number of viral copies (<250 copies / mL). A negative result must be combined with clinical observations, patient history, and epidemiological information.  Fact Sheet for Patients:   StrictlyIdeas.no  Fact Sheet for Healthcare Providers: BankingDealers.co.za  This test is not yet approved or  cleared by the [place] FDA and has been authorized for detection and/or diagnosis of SARS-CoV-2 by FDA under an Emergency Use Authorization (EUA).  This EUA will remain in effect (meaning this test can be used) for the duration of the COVID-19 declaration under Section 564(b)(1) of the Act, 21 U.S.C. section 360bbb-3(b)(1), unless the authorization is terminated or revoked sooner.  Performed at [facility], [address] [place], [place] [address]      Total time spend on discharging this patient, including the last patient exam, discussing the hospital stay, instructions for ongoing care as it relates to all pertinent caregivers, as well as preparing the medical discharge records, prescriptions, and/or referrals as applicable,  is 35 minutes.    Enzo Bi, MD  [facility] 09/09/2019, 9:46 AM  If 7PM-7AM, please contact night-coverage

## 2019-09-09 NOTE — Progress Notes (Signed)
Patient educated on discharge instructions and verbalized understanding. Patient will be driven home by wife. 

## 2019-09-09 NOTE — Progress Notes (Addendum)
Progress Note  Patient Name: Todd Gaines Date of Encounter: 09/09/2019  Primary Cardiologist: new to [facility] - consult by End  Subjective   Status post PCI/DES to the RCA with residual LCx disease to be medically managed. Post cath vitals and labs stable. Lytes at goal.   He feels well this morning and denies any chest pain, dyspnea, palpitations, dizziness, presyncope, syncope.  He has ambulated around the nursing station without issues.  He is tolerating DAPT without issues.  No complications from his right radial cardiac cath site.  Inpatient Medications    Scheduled Meds: . aspirin EC  81 mg Oral Daily  . atorvastatin  80 mg Oral Daily  . enoxaparin (LOVENOX) injection  40 mg Subcutaneous Q24H  . insulin aspart  0-15 Units Subcutaneous TID WC  . prasugrel  10 mg Oral Daily  . sodium chloride flush  3 mL Intravenous Q12H   Continuous Infusions: . sodium chloride     PRN Meds: sodium chloride, acetaminophen, nitroGLYCERIN, ondansetron (ZOFRAN) IV, sodium chloride flush   Vital Signs    Vitals:   09/08/19 1448 09/08/19 1724 09/08/19 1927 09/09/19 0449  BP: 111/80 138/81 120/85 112/80  Pulse: (!) 101 89 83 81  Resp: 18 19 20 20   Temp: 98.4 F (36.9 C) 98.7 F (37.1 C) 98.3 F (36.8 C) 98.2 F (36.8 C)  TempSrc:  Oral Oral Oral  SpO2: 96% 98% 99% 97%  Weight:    118.8 kg  Height:        Intake/Output Summary (Last 24 hours) at 09/09/2019 0727 Last data filed at 09/09/2019 0501 Gross per 24 hour  Intake 370.5 ml  Output 3350 ml  Net -2979.5 ml   Filed Weights   09/08/19 0131 09/08/19 0737 09/09/19 0449  Weight: 120.6 kg 120.6 kg 118.8 kg    Telemetry    SR, 70s to 90s bpm - Personally Reviewed  ECG    NSR, 77 bpm, prior inferior infarct with inferior TWI - Personally Reviewed  Physical Exam   GEN: No acute distress.   Neck: No JVD. Cardiac: RRR, no murmurs, rubs, or gallops. Right radial cardiac cath site is well healing without bleeding,  bruising, swelling, warmth, erythema, or TTP. Radial pulse 2+.  Respiratory: Clear to auscultation bilaterally.  GI: Soft, nontender, non-distended.   MS: No edema; No deformity. Neuro:  Alert and oriented x 3; Nonfocal.  Psych: Normal affect.  Labs    Chemistry Recent Labs  Lab 09/07/19 1815 09/08/19 0249 09/09/19 0441  NA 139 140 140  K 4.4 4.0 4.2  CL 100 102 103  CO2 27 28 28   GLUCOSE 120* 141* 140*  BUN 16 16 16   CREATININE 0.86 0.96 0.91  CALCIUM 9.7 8.8* 8.7*  PROT 7.0  --   --   ALBUMIN 3.9  --   --   AST 26  --   --   ALT 11  --   --   ALKPHOS 74  --   --   BILITOT 0.8  --   --   GFRNONAA >60 >60 >60  GFRAA >60 >60 >60  ANIONGAP 12 10 9      Hematology Recent Labs  Lab 09/07/19 1815 09/08/19 0249 09/09/19 0441  WBC 12.2* 9.8 9.4  RBC 5.44 5.15 5.28  HGB 15.3 14.7 15.0  HCT 45.6 43.3 44.7  MCV 83.8 84.1 84.7  MCH 28.1 28.5 28.4  MCHC 33.6 33.9 33.6  RDW 12.7 12.6 12.7  PLT 282 242  260    Cardiac EnzymesNo results for input(s): TROPONINI in the last 168 hours. No results for input(s): TROPIPOC in the last 168 hours.   BNPNo results for input(s): BNP, PROBNP in the last 168 hours.   DDimer No results for input(s): DDIMER in the last 168 hours.   Radiology    DG Chest 2 View  Result Date: 09/07/2019 IMPRESSION: Normal exam. Electronically Signed   By: Francene Boyers M.D.   On: 09/07/2019 18:49   Cardiac Studies   2D echo 09/08/2019: 1. Left ventricular ejection fraction, by estimation, is 55 to 60%. The  left ventricle has normal function. The left ventricle demonstrates  regional wall motion abnormalities (see scoring diagram/findings for  description). There is mild left ventricular  hypertrophy. Left ventricular diastolic parameters were normal. There is  severe hypokinesis of the left ventricular, basal-mid inferior wall and  inferoseptal wall.  2. Right ventricular systolic function is normal. The right ventricular  size is normal.  Tricuspid regurgitation signal is inadequate for assessing  PA pressure.  3. The mitral valve is grossly normal. No evidence of mitral valve  regurgitation. No evidence of mitral stenosis.  4. The aortic valve has an indeterminant number of cusps. Aortic valve  regurgitation is not visualized. No aortic stenosis is present.  5. Aortic dilatation noted. There is mild dilatation of the aortic root  measuring 40 mm.  6. The inferior vena cava is normal in size with <50% respiratory  variability, suggesting right atrial pressure of 8 mmHg. __________  [facility]HC 09/08/2019: Conclusions: 1. Two-vessel coronary artery disease with 60% distal LCx stenosis and thrombotic occlusion of the distal RCA. 2. Mildly elevated left ventricular filling pressure (LVEDP ~20 mmHg). 3. Successful PCI to distal RCA using resolute Onyx 3.0 x 38 mm drug-eluting stent with 0% residual stenosis and TIMI-3 flow.  Recommendations: 1. Dual antiplatelet therapy with aspirin and prasugrel for 12 months. 2. Aggressive secondary prevention. 3. Favor medical therapy of moderate distal LCx disease.  If Mr. Ratay has recurrent chest pain, functional assessment of this lesion and PCI, if hemodynamically significant, will need to be considered.   Patient Profile     44 y.o. male with history of IDDM with last A1c of 8.7 from 06/2019, HLD, and psoriasis who is being seen today for the evaluation of NSTEMI.  Assessment & Plan    1. 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 Elease Hashimoto  2.  HLD: -LDL this admission of 50 with goal less than 70 -Continue atorvastatin 80 mg daily given NSTEMI  3.  IDDM: -Last A1c 8.7 from 06/2019 -SSI per IM  For questions or updates, please contact [facility] Please consult [email or web address] for contact info under Cardiology/STEMI.  Signed, Christell Faith, PA-C [place] Pager: [phone] 09/09/2019, 7:27 AM   Attending Note:   The patient was seen and examined.  Agree with assessment and plan as noted above.  Changes made to the above note as needed.  Patient seen and independently examined with Christell Faith, PA .   We discussed all aspects of the encounter. I agree with the assessment and plan as stated above.  1.  CAD :   S/p stenting of RCA .  Plan is for medical management of his LCx disease.  Return to work in 1 week  [employer] for discharge.   Hyperlipidemia;  Cont atrova 80 mg a day  Goal LDL is < 70    I have spent a total of 40 minutes with patient reviewing hospital  notes , telemetry, EKGs, labs and examining patient as well as establishing an assessment and plan that was discussed with the patient. > 50% of time was spent in direct patient care.    Thayer Headings, Brooke Bonito., MD, [facility] 09/09/2019, 10:05 AM [address]  [place] Pager [phone]

## 2019-09-11 ENCOUNTER — Encounter: Payer: Self-pay | Admitting: Internal Medicine

## 2019-09-11 ENCOUNTER — Telehealth: Payer: Self-pay | Admitting: Physician Assistant

## 2019-09-11 ENCOUNTER — Other Ambulatory Visit: Payer: Self-pay

## 2019-09-11 MED ORDER — GLUCOSE BLOOD VI STRP: ORAL_STRIP | 2 refills | Status: AC

## 2019-09-11 NOTE — Telephone Encounter (Signed)
Patient contacted regarding discharge from [facility] on 6/26.  Patient understands to follow up with provider Dunn on 7/8 at 0800 at [facility]. Patient understands discharge instructions? yes Patient understands medications and regiment? yes Patient understands to bring all medications to this visit? Yes   Pt will be riding passenger for trip.  Advised pt to call for any further questions or concerns.

## 2019-09-11 NOTE — Telephone Encounter (Signed)
TCM appt 7/8 with Eula Listen, PA Patient asks if he can go out of town this coming weekend. Please call and advise.

## 2019-09-18 NOTE — Progress Notes (Signed)
Cardiology Office Note    Date:  09/21/2019   ID:  [name], DOB [birth year], MRN [id number]  PCP:  Abner Greenspan, MD  Cardiologist:  Nelva Bush, MD  Electrophysiologist:  None   Chief Complaint: Hospital follow-up  History of Present Illness:   Todd Gaines is a 44 y.o. male with history of recently diagnosed CAD in the setting of an NSTEMI status post PCI/DES to the distal RCA in 08/2019 as outlined below, mildly dilated aortic root, insulin-dependent diabetes mellitus with an A1c of 8.7 from 06/2019, HLD, and psoriasis who presents for hospital follow-up from 6/24 through 6/26 for NSTEMI.  He was admitted to the hospital on 6/24 after waking up with a headache and noting the development of substernal chest pressure.  Initially he attributed the symptoms to reflux though his symptoms persisted despite belching and Tums.  Initial high-sensitivity troponin 1129 with a delta of 1557 and ultimately peaked at 2831.  EKG nonacute.  Echo showed an EF of 55 to 60%, mild LVH, severe hypokinesis of the basal mid inferior wall and inferolateral wall, normal diastolic function, normal RV systolic function and RV cavity size, no significant valvular abnormality, mildly dilated aortic root at 40 mm.  LHC showed two-vessel CAD with 60% distal LCx stenosis and thrombotic occlusion of the distal RCA with mildly elevated LVEDP at approximately 20 mmHg.  He underwent successful PCI/DES to the distal RCA.  Medical therapy of the moderate distal LCx disease was recommended with functional assessment for recurrent chest pain recommended.  He comes in accompanied by his wife today and is doing well from a cardiac perspective he did have some mild chest discomfort for a couple of days following his hospital discharge that was not similar prior to his presenting symptoms. Otherwise, he has been without issues. He is undergoing a walking regimen without limitation. No issues from the right radial cardiac cath  site. He has not missed any doses of DAPT. No falls, hematochezia, or melena. 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 In this setting, we are uncertain if he would need a GXT prior to being cleared for driving duties in 2 months time following his MI. Plans to reach out to HR for further recommendations regarding this. He has been contacted by cardiac rehab and will follow up with him. Otherwise, he does not have any issues or concerns at this time.   Labs independently reviewed: 08/2019 - magnesium 2.1, Hgb 15.0, PLT 260, potassium 4.2, BUN 16, SCr 0.91, TC 123, TG 229, HDL 27, LDL 50, albumin 3.9, AST/ALT normal 06/2019 - A1c 8.7, TSH normal  Past Medical History:  Diagnosis Date  . Diabetes mellitus without complication ([place])   . Elevated cholesterol   . Psoriasis     Past Surgical History:  Procedure Laterality Date  . CORONARY STENT INTERVENTION N/A 09/08/2019   Procedure: CORONARY STENT INTERVENTION;  Surgeon: Nelva Bush, MD;  Location: [facility];  Service: Cardiovascular;  Laterality: N/A;  . LEFT HEART CATH AND CORONARY ANGIOGRAPHY N/A 09/08/2019   Procedure: LEFT HEART CATH AND CORONARY ANGIOGRAPHY;  Surgeon: Nelva Bush, MD;  Location: [facility];  Service: Cardiovascular;  Laterality: N/A;    Current Medications: Current Meds  Medication Sig  . aspirin EC 81 MG EC tablet Take 1 tablet (81 mg total) by mouth daily. Swallow whole.  Marland Kitchen atorvastatin (LIPITOR) 80 MG tablet Take 1 tablet (80 mg  total) by mouth daily.  . Blood Glucose Monitoring Suppl (CONTOUR NEXT ONE) KIT 1 each by Does not apply route in the morning, at noon, in the evening, and at bedtime. Use Bayer Contour Next One meter to check blood sugar 4 times daily.  Marland Kitchen glucose blood test strip Use OneTouch Ultra test strips as instructed to check blood sugar 4 times daily. DX:E11.65  . Insulin Disposable Pump (OMNIPOD DASH 5 PACK PODS) MISC  Apply one pod to body once every 3 days for insulin delivery.  . insulin lispro (HUMALOG) 100 UNIT/ML injection Use max of 55 units daily via insulin pump.  . Insulin Pen Needle (NOVOTWIST) 32G X 5 MM MISC Use one per day to inject victoza  . INVOKANA 300 MG TABS tablet TAKE ONE TABLET BY MOUTH DAILY BEFORE BREAKFAST (Patient taking differently: Take 300 mg by mouth daily before breakfast. )  . metFORMIN (GLUCOPHAGE-XR) 500 MG 24 hr tablet Take 1,500 mg by mouth daily with breakfast. Take 3 tablet ('1500mg'$  total) by mouth daily.  . metoprolol tartrate (LOPRESSOR) 25 MG tablet Take 1 tablet (25 mg total) by mouth 2 (two) times daily.  . nitroGLYCERIN (NITROSTAT) 0.4 MG SL tablet Place 1 tablet (0.4 mg total) under the tongue every 5 (five) minutes as needed for chest pain.  . prasugrel (EFFIENT) 10 MG TABS tablet Take 1 tablet (10 mg total) by mouth daily. Last day 09/08/20.  Delsa Grana 90 MG/ML SOSY injection Inject 90 mg into the skin once. Every 3 months.  Marland Kitchen VICTOZA 18 MG/3ML SOPN DIAL AND INJECT UNDER THE SKIN 1.8 MG DAILY    Allergies:   Patient has no known allergies.   Social History   Socioeconomic History  . Marital status: Married    Spouse name: Not on file  . Number of children: Not on file  . Years of education: Not on file  . Highest education level: Not on file  Occupational History  . Not on file  Tobacco Use  . Smoking status: Former Smoker    Packs/day: 0.25    Years: 10.00    Pack years: 2.50    Types: Cigarettes    Quit date: 04/17/2002    Years since quitting: 17.4  . Smokeless tobacco: Never Used  Vaping Use  . Vaping Use: Never used  Substance and Sexual Activity  . Alcohol use: No    Alcohol/week: 0.0 standard drinks  . Drug use: No  . Sexual activity: Yes  Other Topics Concern  . Not on file  Social History Narrative  . Not on file   Social Determinants of Health   Financial Resource Strain:   . Difficulty of Paying Living Expenses:   Food  Insecurity:   . Worried About Charity fundraiser in the Last Year:   . Arboriculturist in the Last Year:   Transportation Needs:   . Film/video editor (Medical):   Marland Kitchen Lack of Transportation (Non-Medical):   Physical Activity:   . Days of Exercise per Week:   . Minutes of Exercise per Session:   Stress:   . Feeling of Stress :   Social Connections:   . Frequency of Communication with Friends and Family:   . Frequency of Social Gatherings with Friends and Family:   . Attends Religious Services:   . Active Member of Clubs or Organizations:   . Attends Archivist Meetings:   Marland Kitchen Marital Status:      Family History:  The  patient's family history includes Diabetes in his mother and paternal grandfather; Hypertension in his paternal grandfather. There is no history of Heart disease or Cancer.  ROS:   Review of Systems  Constitutional: Positive for malaise/fatigue. Negative for chills, diaphoresis, fever and weight loss.  HENT: Negative for congestion.   Eyes: Negative for discharge and redness.  Respiratory: Negative for cough, sputum production, shortness of breath and wheezing.   Cardiovascular: Negative for chest pain, palpitations, orthopnea, claudication, leg swelling and PND.  Gastrointestinal: Negative for abdominal pain, blood in stool, heartburn, melena, nausea and vomiting.  Musculoskeletal: Negative for falls and myalgias.  Skin: Negative for rash.  Neurological: Positive for weakness. Negative for dizziness, tingling, tremors, sensory change, speech change, focal weakness and loss of consciousness.  Endo/Heme/Allergies: Does not bruise/bleed easily.  Psychiatric/Behavioral: Negative for substance abuse. The patient is not nervous/anxious.   All other systems reviewed and are negative.    EKGs/Labs/Other Studies Reviewed:    Studies reviewed were summarized above. The additional studies were reviewed today:  2D echo 09/08/2019: 1. Left ventricular  ejection fraction, by estimation, is 55 to 60%. The  left ventricle has normal function. The left ventricle demonstrates  regional wall motion abnormalities (see scoring diagram/findings for  description). There is mild left ventricular  hypertrophy. Left ventricular diastolic parameters were normal. There is  severe hypokinesis of the left ventricular, basal-mid inferior wall and  inferoseptal wall.  2. Right ventricular systolic function is normal. The right ventricular  size is normal. Tricuspid regurgitation signal is inadequate for assessing  PA pressure.  3. The mitral valve is grossly normal. No evidence of mitral valve  regurgitation. No evidence of mitral stenosis.  4. The aortic valve has an indeterminant number of cusps. Aortic valve  regurgitation is not visualized. No aortic stenosis is present.  5. Aortic dilatation noted. There is mild dilatation of the aortic root  measuring 40 mm.  6. The inferior vena cava is normal in size with <50% respiratory  variability, suggesting right atrial pressure of 8 mmHg. __________  LHC 09/08/2019: Conclusions: 1. Two-vessel coronary artery disease with 60% distal LCx stenosis and thrombotic occlusion of the distal RCA. 2. Mildly elevated left ventricular filling pressure (LVEDP ~20 mmHg). 3. Successful PCI to distal RCA using resolute Onyx 3.0 x 38 mm drug-eluting stent with 0% residual stenosis and TIMI-3 flow.  Recommendations: 1. Dual antiplatelet therapy with aspirin and prasugrel for 12 months. 2. Aggressive secondary prevention. 3. Favor medical therapy of moderate distal LCx disease.  If Mr. Albus has recurrent chest pain, functional assessment of this lesion and PCI, if hemodynamically significant, will need to be considered.   EKG:  EKG is ordered today.  The EKG ordered today demonstrates NSR, 80 bpm, inferior Q waves, nonspecific inferolateral ST-T changes consistent with prior MI  Recent Labs: 06/27/2019: TSH  1.06 09/07/2019: ALT 11 09/09/2019: BUN 16; Creatinine, Ser 0.91; Hemoglobin 15.0; Magnesium 2.1; Platelets 260; Potassium 4.2; Sodium 140  Recent Lipid Panel    Component Value Date/Time   CHOL 123 09/08/2019 0249   TRIG 229 (H) 09/08/2019 0249   HDL 27 (L) 09/08/2019 0249   CHOLHDL 4.6 09/08/2019 0249   VLDL 46 (H) 09/08/2019 0249   LDLCALC 50 09/08/2019 0249   LDLDIRECT 90.0 06/27/2019 1507    PHYSICAL EXAM:    VS:  BP 110/78 (BP Location: Left Arm, Patient Position: Sitting, Cuff Size: Normal)   Pulse 80   Ht 6' 0.5" (1.842 m)   Wt 263 lb  8 oz (119.5 kg)   SpO2 98%   BMI 35.25 kg/m   BMI: Body mass index is 35.25 kg/m.  Physical Exam Constitutional:      Appearance: He is well-developed.  HENT:     Head: Normocephalic and atraumatic.  Eyes:     General:        Right eye: No discharge.        Left eye: No discharge.  Neck:     Vascular: No JVD.  Cardiovascular:     Rate and Rhythm: Normal rate and regular rhythm.     Pulses: No midsystolic click and no opening snap.     Heart sounds: Normal heart sounds, S1 normal and S2 normal. Heart sounds not distant. No murmur heard.  No friction rub.     Comments: No bruit along the right radial artery. Pulmonary:     Effort: Pulmonary effort is normal. No respiratory distress.     Breath sounds: Normal breath sounds. No decreased breath sounds, wheezing or rales.  Chest:     Chest wall: No tenderness.     Comments: Right radial cardiac cath site is without bleeding, ecchymosis, warmth, swelling, erythema, or tenderness to palpation.  Radial pulse 2+. Abdominal:     General: There is no distension.     Palpations: Abdomen is soft.     Tenderness: There is no abdominal tenderness.  Musculoskeletal:     Cervical back: Normal range of motion.  Skin:    General: Skin is warm and dry.     Nails: There is no clubbing.  Neurological:     Mental Status: He is alert and oriented to person, place, and time.  Psychiatric:         Speech: Speech normal.        Behavior: Behavior normal.        Thought Content: Thought content normal.        Judgment: Judgment normal.     Wt Readings from Last 3 Encounters:  09/21/19 263 lb 8 oz (119.5 kg)  09/09/19 261 lb 12.8 oz (118.8 kg)  09/04/19 270 lb (122.5 kg)     ASSESSMENT & PLAN:   1. CAD involving the native coronary arteries with recent NSTEMI: He is doing well without any symptoms concerning for angina.  Continue dual antiplatelet therapy aspirin and Effient without interruption for at least 12 months dating back to date of PCI.  Continue secondary prevention with atorvastatin, metoprolol, and as needed SL NTG.  Post-cath instructions.  Cardiac rehab.  Should he have refractory angina despite optimal medical therapy consider functional study to assess for clinical significance of LCx stenosis.  Check post-cath CBC on DAPT. 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 He will reach out to HR at his job and let us know if this is required or not.  2. HLD: LDL of 50 from 08/2018, with normal LFT at that time.  Continue atorvastatin 80 mg daily.  3. Dilated aortic root: Follow-up periodic echo.  Optimal BP, heart rate, and lipid control.  4. IDDM: Last A1c 8.7.  Follow-up with PCP as directed.  Disposition: Follow-up with Dr. Saunders Revel or APP in 2 to 3 months.   Medication Adjustments/Labs and Tests Ordered: Current medicines are reviewed at length with the patient today.  Concerns regarding medicines are outlined  above. Medication changes, Labs and Tests ordered today are summarized above and listed in the Patient Instructions accessible in Encounters.   Signed, Christell Faith, PA-C 09/21/2019 8:09 AM     [facility] [address] [place], [place] [address] [phone]

## 2019-09-21 ENCOUNTER — Other Ambulatory Visit: Payer: Self-pay

## 2019-09-21 ENCOUNTER — Encounter: Payer: Self-pay | Admitting: Physician Assistant

## 2019-09-21 ENCOUNTER — Ambulatory Visit: Payer: 59 | Admitting: Physician Assistant

## 2019-09-21 ENCOUNTER — Telehealth: Payer: Self-pay | Admitting: Physician Assistant

## 2019-09-21 VITALS — BP 110/78 | HR 80 | Ht 72.5 in | Wt 263.5 lb

## 2019-09-21 DIAGNOSIS — I214 Non-ST elevation (NSTEMI) myocardial infarction: Secondary | ICD-10-CM

## 2019-09-21 DIAGNOSIS — I77810 Thoracic aortic ectasia: Secondary | ICD-10-CM

## 2019-09-21 DIAGNOSIS — Z794 Long term (current) use of insulin: Secondary | ICD-10-CM

## 2019-09-21 DIAGNOSIS — E785 Hyperlipidemia, unspecified: Secondary | ICD-10-CM

## 2019-09-21 DIAGNOSIS — E119 Type 2 diabetes mellitus without complications: Secondary | ICD-10-CM

## 2019-09-21 DIAGNOSIS — I2510 Atherosclerotic heart disease of native coronary artery without angina pectoris: Secondary | ICD-10-CM

## 2019-09-21 MED ORDER — METOPROLOL TARTRATE 25 MG PO TABS: 25.0000 mg | ORAL_TABLET | Freq: Two times a day (BID) | ORAL | 3 refills | Status: DC

## 2019-09-21 MED ORDER — ATORVASTATIN CALCIUM 80 MG PO TABS: 80.0000 mg | ORAL_TABLET | Freq: Every day | ORAL | 3 refills | Status: DC

## 2019-09-21 NOTE — Telephone Encounter (Signed)
Patients wife calling in stating that patients work needs a letter documenting that is able to return to work with restrictions in detail  Patient would like to pick up the letter once it is complete, he is hoping to return back early next week  Please advise

## 2019-09-21 NOTE — Patient Instructions (Signed)
Medication Instructions:  No changes  *If you need a refill on your cardiac medications before your next appointment, please call your pharmacy*   Lab Work: CBC today  If you have labs (blood work) drawn today and your tests are completely normal, you will receive your results only by:  MyChart Message (if you have MyChart) OR  A paper copy in the mail If you have any lab test that is abnormal or we need to change your treatment, we will call you to review the results.   Testing/Procedures: None   Follow-Up: At [facility], you and your health needs are our priority.  As part of our continuing mission to provide you with exceptional heart care, we have created designated Provider Care Teams.  These Care Teams include your primary Cardiologist (physician) and Advanced Practice Providers (APPs -  Physician Assistants and Nurse Practitioners) who all work together to provide you with the care you need, when you need it.  We recommend signing up for the patient portal called "MyChart".  Sign up information is provided on this After Visit Summary.  MyChart is used to connect with patients for Virtual Visits (Telemedicine).  Patients are able to view lab/test results, encounter notes, upcoming appointments, etc.  Non-urgent messages can be sent to your provider as well.   To learn more about what you can do with MyChart, go to [email or web address].    Your next appointment:   3 month(s)  The format for your next appointment:   In Person  Provider:    You may see Yvonne Kendall, MD or one of the following Advanced Practice Providers on your designated Care Team:    Nicolasa Ducking, NP  Eula Listen, PA-C  Marisue Ivan, PA-C

## 2019-09-22 LAB — CBC
Hematocrit: 46.2 % (ref 37.5–51.0)
Hemoglobin: 15.1 g/dL (ref 13.0–17.7)
MCH: 27.4 pg (ref 26.6–33.0)
MCHC: 32.7 g/dL (ref 31.5–35.7)
MCV: 84 fL (ref 79–97)
Platelets: 274 10*3/uL (ref 150–450)
RBC: 5.52 x10E6/uL (ref 4.14–5.80)
RDW: 12.9 % (ref 11.6–15.4)
WBC: 10 10*3/uL (ref 3.4–10.8)

## 2019-09-22 NOTE — Telephone Encounter (Signed)
Patient is calling back with fax number for return to work note.[phone]

## 2019-09-22 NOTE — Telephone Encounter (Signed)
Letter, dictated, printed, and signed.  Letter placed on nurses desk for patient pickup.

## 2019-09-22 NOTE — Telephone Encounter (Signed)
Spoke with patients wife per release form and reviewed that I did not see earlier call with number. Let her know that I would fax today and mail original via mail. She verbalized understanding with no further questions at this time.

## 2019-09-22 NOTE — Telephone Encounter (Signed)
Spoke with patients wife per release form and they are not able to print letter from My Chart. She inquired if we could email and reviewed that is not option but that I could fax letter and then mail her the hard copy for their records. She will call and get fax number then call back with that information. She states they are not able to come pick up today due to schedules. Requested that she call back with fax number and I would be happy to send that to them and mail to her. She verbalized understanding of our conversation, agreement with plan, and had no further questions at this time.

## 2019-09-22 NOTE — Telephone Encounter (Signed)
Left voicemail message.

## 2019-10-02 ENCOUNTER — Other Ambulatory Visit: Payer: Self-pay | Admitting: Endocrinology

## 2019-10-17 ENCOUNTER — Ambulatory Visit: Payer: 59 | Admitting: Psychology

## 2019-11-02 DIAGNOSIS — F902 Attention-deficit hyperactivity disorder, combined type: Secondary | ICD-10-CM | POA: Diagnosis not present

## 2019-11-09 ENCOUNTER — Other Ambulatory Visit (INDEPENDENT_AMBULATORY_CARE_PROVIDER_SITE_OTHER): Payer: 59

## 2019-11-09 ENCOUNTER — Other Ambulatory Visit: Payer: Self-pay

## 2019-11-09 ENCOUNTER — Other Ambulatory Visit: Payer: Self-pay | Admitting: Endocrinology

## 2019-11-09 DIAGNOSIS — E1165 Type 2 diabetes mellitus with hyperglycemia: Secondary | ICD-10-CM

## 2019-11-09 DIAGNOSIS — Z794 Long term (current) use of insulin: Secondary | ICD-10-CM | POA: Diagnosis not present

## 2019-11-09 LAB — COMPREHENSIVE METABOLIC PANEL
ALT: 11 U/L (ref 0–53)
AST: 15 U/L (ref 0–37)
Albumin: 4.2 g/dL (ref 3.5–5.2)
Alkaline Phosphatase: 87 U/L (ref 39–117)
BUN: 16 mg/dL (ref 6–23)
CO2: 32 mEq/L (ref 19–32)
Calcium: 9.4 mg/dL (ref 8.4–10.5)
Chloride: 102 mEq/L (ref 96–112)
Creatinine, Ser: 0.92 mg/dL (ref 0.40–1.50)
GFR: 89.23 mL/min (ref >60.00)
Glucose, Bld: 105 mg/dL — ABNORMAL HIGH (ref 70–99)
Potassium: 4.9 mEq/L (ref 3.5–5.1)
Sodium: 137 mEq/L (ref 135–145)
Total Bilirubin: 0.8 mg/dL (ref 0.2–1.2)
Total Protein: 7.2 g/dL (ref 6.0–8.3)

## 2019-11-09 LAB — MICROALBUMIN / CREATININE URINE RATIO
Creatinine,U: 71.6 mg/dL
Microalb Creat Ratio: 1 mg/g (ref 0.0–30.0)
Microalb, Ur: <0.7 mg/dL (ref 0.0–1.9)

## 2019-11-09 LAB — HEMOGLOBIN A1C: Hgb A1c MFr Bld: 6.6 % — ABNORMAL HIGH (ref 4.6–6.5)

## 2019-11-10 LAB — HM DIABETES EYE EXAM

## 2019-11-14 ENCOUNTER — Ambulatory Visit (INDEPENDENT_AMBULATORY_CARE_PROVIDER_SITE_OTHER): Payer: 59 | Admitting: Endocrinology

## 2019-11-14 ENCOUNTER — Encounter: Payer: Self-pay | Admitting: Endocrinology

## 2019-11-14 ENCOUNTER — Other Ambulatory Visit: Payer: Self-pay

## 2019-11-14 VITALS — BP 110/70 | HR 90 | Ht 72.5 in | Wt 264.0 lb

## 2019-11-14 DIAGNOSIS — Z794 Long term (current) use of insulin: Secondary | ICD-10-CM

## 2019-11-14 DIAGNOSIS — E1165 Type 2 diabetes mellitus with hyperglycemia: Secondary | ICD-10-CM | POA: Diagnosis not present

## 2019-11-14 DIAGNOSIS — E782 Mixed hyperlipidemia: Secondary | ICD-10-CM | POA: Diagnosis not present

## 2019-11-14 DIAGNOSIS — Z6836 Body mass index (BMI) 36.0-36.9, adult: Secondary | ICD-10-CM | POA: Diagnosis not present

## 2019-11-14 NOTE — Patient Instructions (Addendum)
Must bolus at mealtimes upto 5 units  Sep up alerts for boluses, keep pdm handy  Check blood sugars on waking up 3 days a week  Also check blood sugars about 2 hours after meals and do this after different meals by rotation  Recommended blood sugar levels on waking up are 90-130 and about 2 hours after meal is 130-180  Please bring your blood sugar monitor to each visit, thank you  Invokana in am before meal

## 2019-11-14 NOTE — Progress Notes (Signed)
Patient ID: Todd Gaines, male   DOB: 1975/06/15, 44 y.o.   MRN: [id number]           Reason for Appointment: Follow-up for Type 2 Diabetes  Referring physician: [facility]   History of Present Illness:          Date of diagnosis of type 2 diabetes mellitus: 2012       Background history:   He believes her A1c was 12% when he was first diagnosed to have diabetes and was having symptoms of feeling very tired. Initially he made changes in his lifestyle and was able to lose some weight also He was probably treated with metformin and may have been given Tradjenta subsequently Previous records are not available He thinks his A1c had been around 7-7.5% for some time until last year He was started on basal insulin in 9/16 when his A1c was 9.9 He did not use the V-go pump because of lack of adequate insurance coverage and out-of-pocket expense, this was improving his control  Recent history:   OMNIPOD PUMP SETTINGS: Basal rate 1.15 midnight-6 AM and then 1.35 Carbohydrate ratio 1:1, correction 1: 50 and target 120-140, active insulin 4 hours  Total insulin use recently about 37 units with 24% in boluses  INSULIN regimen previously: lantus 40 units in the evening, Humalog before  dinner usually 10-12 units  Non-insulin hypoglycemic drugs: Metformin ER 2000 mg a day, Victoza 1.8 mg daily, Invokana 300 mg daily   his A1c is 6.6 compared to 8.7  Also previous fructosamine is excellent at 258 compared to previous readings  Current blood sugar patterns and problems identified:    He has been checking his blood sugars very infrequently  Also he is still not bolusing much for his meals and only occasionally at night when his blood sugars go up  He has no boluses for blood sugars at lunchtime  Usually eating some toast and a protein in the morning but not taking any bolus usually, has only 1 reading after breakfast 180 without the bolus  Frequently he may not have his pre-DM with him  when he is eating dinner  He says he is more active now with outside activities, usually not going to the gym much  His weight is slightly increased  FASTING readings are relatively better compared to before with increasing his overnight basal rate  No hypoglycemia also  He was told to take Invokana in the mornings but he is taking this at bedtime  Exercise: active at work   Side effects from medications have been: Transient nausea from Trulicity  Compliance with the medical regimen: Fair    Glucose monitoring:  0-1 times a day         Glucometer:  One Touch Blood Glucose readings     PRE-MEAL Fasting Lunch Dinner Bedtime Overall  Glucose range:  110-136  93  185  151-255   Mean/median:      158   POST-MEAL PC Breakfast PC Lunch PC Dinner  Glucose range:  180    Mean/median:      Call tomorrow previously:  PRE-MEAL Fasting Lunch Dinner Bedtime Overall  Glucose range:  126-188  129-207  115-291  137-269  115-301  Mean/median:  160  165  235  208  182    Self-care:  Typical meal intake: Breakfast is eggs, sausage sometimes, otherwise may have breakfast burrito  Snacks will be peanut butter crackers, chips, pretzels and peanut butter  Dietician visit, most recent: 10/16                Weight history: 275-298 lbs  Wt Readings from Last 3 Encounters:  11/14/19 264 lb (119.7 kg)  09/21/19 263 lb 8 oz (119.5 kg)  09/09/19 261 lb 12.8 oz (118.8 kg)    Glycemic control:   Lab Results  Component Value Date   HGBA1C 6.6 (H) 11/09/2019   HGBA1C 8.7 (H) 07/03/2019   HGBA1C 9.5 (H) 12/16/2018   Lab Results  Component Value Date   MICROALBUR <0.7 11/09/2019   LDLCALC 50 09/08/2019   CREATININE 0.92 11/09/2019    Lab on 11/09/2019  Component Date Value Ref Range Status  . Microalb, Ur 11/09/2019 <0.7  0.0 - 1.9 mg/dL Final  . Creatinine,U 11/09/2019 71.6  mg/dL Final  . Microalb Creat Ratio 11/09/2019 1.0  0.0 - 30.0 mg/g Final  . Sodium  11/09/2019 137  135 - 145 mEq/L Final  . Potassium 11/09/2019 4.9  3.5 - 5.1 mEq/L Final  . Chloride 11/09/2019 102  96 - 112 mEq/L Final  . CO2 11/09/2019 32  19 - 32 mEq/L Final  . Glucose, Bld 11/09/2019 105* 70 - 99 mg/dL Final  . BUN 11/09/2019 16  6 - 23 mg/dL Final  . Creatinine, Ser 11/09/2019 0.92  0.40 - 1.50 mg/dL Final  . Total Bilirubin 11/09/2019 0.8  0.2 - 1.2 mg/dL Final  . Alkaline Phosphatase 11/09/2019 87  39 - 117 U/L Final  . AST 11/09/2019 15  0 - 37 U/L Final  . ALT 11/09/2019 11  0 - 53 U/L Final  . Total Protein 11/09/2019 7.2  6.0 - 8.3 g/dL Final  . Albumin 11/09/2019 4.2  3.5 - 5.2 g/dL Final  . GFR 11/09/2019 89.23  >60.00 mL/min Final  . Calcium 11/09/2019 9.4  8.4 - 10.5 mg/dL Final  . Hgb A1c MFr Bld 11/09/2019 6.6* 4.6 - 6.5 % Final   Glycemic Control Guidelines for People with Diabetes:Non Diabetic:  <6%Goal of Therapy: <7%Additional Action Suggested:  >8%       Allergies as of 11/14/2019   No Known Allergies     Medication List       Accurate as of November 14, 2019  9:57 AM. If you have any questions, ask your nurse or doctor.        aspirin 81 MG EC tablet Take 1 tablet (81 mg total) by mouth daily. Swallow whole.   atorvastatin 80 MG tablet Commonly known as: LIPITOR Take 1 tablet (80 mg total) by mouth daily.   canagliflozin 300 MG Tabs tablet Commonly known as: Invokana Take 1 tablet (300 mg total) by mouth daily before breakfast.   Contour Next One Kit 1 each by Does not apply route in the morning, at noon, in the evening, and at bedtime. Use Bayer Contour Next One meter to check blood sugar 4 times daily.   glucose blood test strip Use OneTouch Ultra test strips as instructed to check blood sugar 4 times daily. DX:E11.65   insulin lispro 100 UNIT/ML injection Commonly known as: HumaLOG Use max of 55 units daily via insulin pump.   metFORMIN 500 MG 24 hr tablet Commonly known as: GLUCOPHAGE-XR Take 1,500 mg by mouth daily  with breakfast. Take 3 tablet ($RemoveB'1500mg'VMrRSCwL$  total) by mouth daily.   metoprolol tartrate 25 MG tablet Commonly known as: LOPRESSOR Take 1 tablet (25 mg total) by mouth 2 (two) times daily.   nitroGLYCERIN 0.4 MG SL tablet  Commonly known as: NITROSTAT Place 1 tablet (0.4 mg total) under the tongue every 5 (five) minutes as needed for chest pain.   NovoTwist 32G X 5 MM Misc Generic drug: Insulin Pen Needle Use one per day to inject victoza   OmniPod Dash 5 Pack Pods Misc Apply one pod to body once every 3 days for insulin delivery.   prasugrel 10 MG Tabs tablet Commonly known as: EFFIENT Take 1 tablet (10 mg total) by mouth daily. Last day 09/08/20.   Stelara 90 MG/ML Sosy injection Generic drug: ustekinumab Inject 90 mg into the skin once. Every 3 months.   Victoza 18 MG/3ML Sopn Generic drug: liraglutide DIAL AND INJECT UNDER THE SKIN 1.8 MG DAILY       Allergies: No Known Allergies  Past Medical History:  Diagnosis Date  . CAD (coronary artery disease)   . Diabetes mellitus without complication ([place])   . Elevated cholesterol   . Psoriasis     Past Surgical History:  Procedure Laterality Date  . CORONARY STENT INTERVENTION N/A 09/08/2019   Procedure: CORONARY STENT INTERVENTION;  Surgeon: Nelva Bush, MD;  Location: [facility];  Service: Cardiovascular;  Laterality: N/A;  . LEFT HEART CATH AND CORONARY ANGIOGRAPHY N/A 09/08/2019   Procedure: LEFT HEART CATH AND CORONARY ANGIOGRAPHY;  Surgeon: Nelva Bush, MD;  Location: [facility];  Service: Cardiovascular;  Laterality: N/A;    Family History  Problem Relation Age of Onset  . Diabetes Mother   . Diabetes Paternal Grandfather   . Hypertension Paternal Grandfather   . Heart disease Neg Hx   . Cancer Neg Hx     Social History:  reports that he quit smoking about 17 years ago. His smoking use included cigarettes. He has a 2.50 pack-year smoking history. He has never used smokeless tobacco.  He reports that he does not drink alcohol and does not use drugs.    Review of Systems    Lipid history: On treatment with Lipitor 80 mg Previously LDL below 100 but now with his recent coronary artery disease he has been told to follow-up on the dose  Has persistently low HDL and high triglycerides   Lab Results  Component Value Date   CHOL 123 09/08/2019   HDL 27 (L) 09/08/2019   LDLCALC 50 09/08/2019   LDLDIRECT 90.0 06/27/2019   TRIG 229 (H) 09/08/2019   CHOLHDL 4.6 09/08/2019             Blood pressure normal without medications  No renal dysfunction with Invokana 300 mg  BP Readings from Last 3 Encounters:  11/14/19 110/70  09/21/19 110/78  09/09/19 120/81    Lab Results  Component Value Date   CREATININE 0.92 11/09/2019   CREATININE 0.91 09/09/2019   CREATININE 0.96 09/08/2019       Physical Examination:  BP 110/70 (BP Location: Left Arm, Patient Position: Sitting, Cuff Size: Normal)   Pulse 90   Ht 6' 0.5" (1.842 m)   Wt 264 lb (119.7 kg)   SpO2 94%   BMI 35.31 kg/m        ASSESSMENT:  Diabetes type 2, with obesity  See history of present illness for detailed discussion of his current management, blood sugar patterns and problems identified  His A1c is much better than usual at 6.6  He has benefited significantly from using the insulin pump  His insulin pump download, blood sugar patterns and day-to-day management and problems identified were discussed  He is getting  by with very little bolus insulin although frequently has high readings after dinner Monitoring is suboptimal Currently he does not want to pay for the freestyle libre especially since he has difficulty getting it to stick He wants to wait till the Dexcom and Omni pod combination is available  Microalbumin normal  PLAN:    He will set up a timer/alert on his PDM to remind himself of checking sugars and bolusing with every meal Boluses for meals will be about at least 2  units and preferably 3 to 4 units for lunch and dinner based on carbohydrate intake No change in basal rate He needs to make sure he boluses at the time of his meals especially in the evening We will try to keep his PDM with him all times Change Invokana to morning before breakfast  For his lipids needs fasting lab work and consider adding Vascepa if triglycerides still high for cardiovascular risk reduction  Patient Instructions  Must bolus at mealtimes upto 5 units  Sep up alerts for boluses, keep pdm handy  Check blood sugars on waking up 3 days a week  Also check blood sugars about 2 hours after meals and do this after different meals by rotation  Recommended blood sugar levels on waking up are 90-130 and about 2 hours after meal is 130-180  Please bring your blood sugar monitor to each visit, thank you  Invokana in am before meal        Elayne Snare 11/14/2019, 9:57 AM   Note: This office note was prepared with Dragon voice recognition system technology. Any transcriptional errors that result from this process are unintentional.

## 2019-11-15 ENCOUNTER — Encounter: Payer: Self-pay | Admitting: *Deleted

## 2019-11-15 ENCOUNTER — Encounter: Payer: Self-pay | Admitting: Family Medicine

## 2019-11-27 ENCOUNTER — Other Ambulatory Visit: Payer: Self-pay | Admitting: Endocrinology

## 2019-11-29 ENCOUNTER — Telehealth: Payer: Self-pay | Admitting: *Deleted

## 2019-11-29 NOTE — Telephone Encounter (Signed)
Spoke with patient. Patient will call insurance company and find out which is covered.

## 2019-11-29 NOTE — Telephone Encounter (Signed)
Needs to find out if Trulicity or Ozempic is covered

## 2019-11-29 NOTE — Telephone Encounter (Signed)
Received PA request for Victoza. Do you wish to proceed?

## 2019-12-25 ENCOUNTER — Ambulatory Visit (INDEPENDENT_AMBULATORY_CARE_PROVIDER_SITE_OTHER): Payer: 59 | Admitting: Internal Medicine

## 2019-12-25 ENCOUNTER — Other Ambulatory Visit: Payer: Self-pay

## 2019-12-25 ENCOUNTER — Encounter: Payer: Self-pay | Admitting: Internal Medicine

## 2019-12-25 VITALS — BP 116/84 | HR 66 | Ht 73.0 in | Wt 263.6 lb

## 2019-12-25 DIAGNOSIS — I77810 Thoracic aortic ectasia: Secondary | ICD-10-CM

## 2019-12-25 DIAGNOSIS — Z794 Long term (current) use of insulin: Secondary | ICD-10-CM

## 2019-12-25 DIAGNOSIS — E785 Hyperlipidemia, unspecified: Secondary | ICD-10-CM | POA: Diagnosis not present

## 2019-12-25 DIAGNOSIS — I10 Essential (primary) hypertension: Secondary | ICD-10-CM | POA: Diagnosis not present

## 2019-12-25 DIAGNOSIS — I2510 Atherosclerotic heart disease of native coronary artery without angina pectoris: Secondary | ICD-10-CM | POA: Diagnosis not present

## 2019-12-25 DIAGNOSIS — E1159 Type 2 diabetes mellitus with other circulatory complications: Secondary | ICD-10-CM

## 2019-12-25 NOTE — Progress Notes (Signed)
Follow-up Outpatient Visit Date: 12/25/2019  Primary Care Provider: Abner Greenspan, MD [place] [address]  Chief Complaint: Follow-up coronary artery disease  HPI:  [name] is a 44 y.o. male with history of coronary artery disease with NSTEMI in 08/2019 status post PCI to the distal RCA, mildly dilated aortic root, type 2 diabetes mellitus, hyperlipidemia, and psoriasis, who presents for follow-up of coronary artery disease.  He was last seen in our office in early July for hospital follow-up after his preceding NSTEMI.  At his follow-up visit, he was doing relatively well though he reported mild chest discomfort for the first few days after being discharged from the hospital.  He was walking regularly without limitations or recurrent chest pain.  No medication changes or additional testing were pursued.  Today, Mr. Even reports that he has been doing very well.  He denies chest pain, shortness of breath, palpitations, lightheadedness, edema.  He is trying to walk more.  He is tolerating his medications well and denies bleeding, remaining on dual antiplatelet therapy.  --------------------------------------------------------------------------------------------------  Past Medical History:  Diagnosis Date   CAD (coronary artery disease)    Diabetes mellitus without complication ([place])    Elevated cholesterol    Psoriasis    Past Surgical History:  Procedure Laterality Date   CORONARY STENT INTERVENTION N/A 09/08/2019   Procedure: CORONARY STENT INTERVENTION;  Surgeon: Nelva Bush, MD;  Location: [facility];  Service: Cardiovascular;  Laterality: N/A;   LEFT HEART CATH AND CORONARY ANGIOGRAPHY N/A 09/08/2019   Procedure: LEFT HEART CATH AND CORONARY ANGIOGRAPHY;  Surgeon: Nelva Bush, MD;  Location: [facility];  Service: Cardiovascular;  Laterality: N/A;    Recent CV Pertinent Labs: Lab Results  Component Value Date    CHOL 123 09/08/2019   HDL 27 (L) 09/08/2019   LDLCALC 50 09/08/2019   LDLDIRECT 90.0 06/27/2019   TRIG 229 (H) 09/08/2019   CHOLHDL 4.6 09/08/2019   INR 1.0 09/07/2019   K 4.9 11/09/2019   MG 2.1 09/09/2019   BUN 16 11/09/2019   CREATININE 0.92 11/09/2019    Past medical and surgical history were reviewed and updated in EPIC.  Current Meds  Medication Sig   aspirin EC 81 MG EC tablet Take 1 tablet (81 mg total) by mouth daily. Swallow whole.   atorvastatin (LIPITOR) 80 MG tablet Take 1 tablet (80 mg total) by mouth daily.   Blood Glucose Monitoring Suppl (CONTOUR NEXT ONE) KIT 1 each by Does not apply route in the morning, at noon, in the evening, and at bedtime. Use Bayer Contour Next One meter to check blood sugar 4 times daily.   canagliflozin (INVOKANA) 300 MG TABS tablet Take 1 tablet (300 mg total) by mouth daily before breakfast.   glucose blood test strip Use OneTouch Ultra test strips as instructed to check blood sugar 4 times daily. DX:E11.65   Insulin Disposable Pump (OMNIPOD DASH 5 PACK PODS) MISC Apply one pod to body once every 3 days for insulin delivery.   insulin lispro (HUMALOG) 100 UNIT/ML injection Use max of 55 units daily via insulin pump.   Insulin Pen Needle (NOVOTWIST) 32G X 5 MM MISC Use one per day to inject victoza   metFORMIN (GLUCOPHAGE-XR) 500 MG 24 hr tablet Take 1,500 mg by mouth daily with breakfast. Take 3 tablet (1549m total) by mouth daily.   metoprolol tartrate (LOPRESSOR) 25 MG tablet Take 1 tablet (25 mg total) by mouth 2 (  two) times daily.   nitroGLYCERIN (NITROSTAT) 0.4 MG SL tablet Place 1 tablet (0.4 mg total) under the tongue every 5 (five) minutes as needed for chest pain.   prasugrel (EFFIENT) 10 MG TABS tablet Take 1 tablet (10 mg total) by mouth daily. Last day 09/08/20.   STELARA 90 MG/ML SOSY injection Inject 90 mg into the skin once. Every 3 months.   VICTOZA 18 MG/3ML SOPN DIAL AND INJECT UNDER THE SKIN 1.8 MG DAILY     Allergies: Patient has no known allergies.  Social History   Tobacco Use   Smoking status: Former Smoker    Packs/day: 0.25    Years: 10.00    Pack years: 2.50    Types: Cigarettes    Quit date: 04/17/2002    Years since quitting: 17.7   Smokeless tobacco: Never Used  Vaping Use   Vaping Use: Never used  Substance Use Topics   Alcohol use: No    Alcohol/week: 0.0 standard drinks   Drug use: No    Family History  Problem Relation Age of Onset   Diabetes Mother    Diabetes Paternal Grandfather    Hypertension Paternal Grandfather    Heart disease Neg Hx    Cancer Neg Hx     Review of Systems: A 12-system review of systems was performed and was negative except as noted in the HPI.  --------------------------------------------------------------------------------------------------  Physical Exam: BP 116/84    Pulse 66    Ht _0  (1.854 m)    Wt 263 lb 9.6 oz (119.6 kg)    BMI 34.78 kg/m   General: NAD. HEENT: No conjunctival pallor or scleral icterus. Facemask in place. Neck: Supple without lymphadenopathy, thyromegaly, JVD, or HJR. Lungs: Normal work of breathing. Clear to auscultation bilaterally without wheezes or crackles. Heart: Regular rate and rhythm without murmurs, rubs, or gallops. Abd: Bowel sounds present. Soft, NT/ND. Ext: No lower extremity edema. Radial, PT, and DP pulses are 2+ bilaterally. Skin: Warm and dry without rash.  EKG: Normal sinus rhythm with nonspecific T wave abnormality.  Compared with prior tracing from 09/21/2019, criteria for inferior infarct are no longer present.  Inferior T wave inversions are also less pronounced.  Lab Results  Component Value Date   WBC 10.0 09/21/2019   HGB 15.1 09/21/2019   HCT 46.2 09/21/2019   MCV 84 09/21/2019   PLT 274 09/21/2019    Lab Results  Component Value Date   NA 137 11/09/2019   K 4.9 11/09/2019   CL 102 11/09/2019   CO2 32 11/09/2019   BUN 16 11/09/2019   CREATININE 0.92  11/09/2019   GLUCOSE 105 (H) 11/09/2019   ALT 11 11/09/2019    Lab Results  Component Value Date   CHOL 123 09/08/2019   HDL 27 (L) 09/08/2019   LDLCALC 50 09/08/2019   LDLDIRECT 90.0 06/27/2019   TRIG 229 (H) 09/08/2019   CHOLHDL 4.6 09/08/2019    --------------------------------------------------------------------------------------------------  ASSESSMENT AND PLAN: Coronary artery disease: Mr. Sparacino continues to do well following his STEMI with primary PCI to the distal RCA in June.  He has moderate to severe disease involving the LCx, which was not intervened upon at that time.  Given the lack of symptoms, I recommend continued aggressive medical therapy and secondary prevention.  We will plan to complete 12 months of dual antiplatelet therapy with aspirin and prasugrel, which Mr. Bumbaugh is tolerating well.  Hyperlipidemia: LDL noted to be excellent at the time of MI.  We will  plan to continue atorvastatin 80 mg daily, with plans to check a lipid panel when Mr. Per MAR follows up in 3 months.  Hypertension: Diastolic blood pressure is mildly elevated (goal blood pressure less than 130/80).  Portance of sodium restriction was reinforced.  We will continue with metoprolol tartrate 25 mg twice daily.  Consider addition of an ACE inhibitor or ARB at follow-up if blood pressure remains above goal.  Dilated aortic root: Mild by echo at the time of MI in June.  Consider repeat echo versus cross-sectional imaging next summer to ensure stability.  Type 2 diabetes mellitus: Most recent hemoglobin A1c at goal in the late August.  Continue current medications and close follow-up with endocrinology.  Follow-up: Mr. Moline wishes to transition his care to our [facility] office, as it is closer to his home and job.  We will make arrangements for him to establish with a new provider at that location in 3 months.  Nelva Bush, MD 12/26/2019 7:19 PM

## 2019-12-25 NOTE — Patient Instructions (Signed)
Medication Instructions:  Your physician recommends that you continue on your current medications as directed. Please refer to the Current Medication list given to you today.  *If you need a refill on your cardiac medications before your next appointment, please call your pharmacy*  Follow-Up: At [facility], you and your health needs are our priority.  As part of our continuing mission to provide you with exceptional heart care, we have created designated Provider Care Teams.  These Care Teams include your primary Cardiologist (physician) and Advanced Practice Providers (APPs -  Physician Assistants and Nurse Practitioners) who all work together to provide you with the care you need, when you need it.  We recommend signing up for the patient portal called "MyChart".  Sign up information is provided on this After Visit Summary.  MyChart is used to connect with patients for Virtual Visits (Telemedicine).  Patients are able to view lab/test results, encounter notes, upcoming appointments, etc.  Non-urgent messages can be sent to your provider as well.   To learn more about what you can do with MyChart, go to [email or web address].    Your next appointment:   3 month(s) for morning appointment so you can have cholesterol lab work drawn. At [facility] office, better for location.   The format for your next appointment:   In Person  Provider:   Any provider to establish with.

## 2019-12-26 ENCOUNTER — Encounter: Payer: Self-pay | Admitting: Internal Medicine

## 2019-12-27 ENCOUNTER — Telehealth: Payer: Self-pay | Admitting: Internal Medicine

## 2019-12-27 NOTE — Telephone Encounter (Signed)
Attempted to reschedule to another provider Shari Prows or [place] )   No ans no vm

## 2019-12-27 NOTE — Telephone Encounter (Signed)
-----   Message from Yvonne Kendall, MD sent at 12/26/2019  7:20 PM EDT ----- Regarding: f/u appointment Hello,  Mr. Hestand requested to f/u at the [facility] office at our visit yesterday.  It appears that he is scheduled to see Dr. Elease Hashimoto in January.  As Dr. Elease Hashimoto is in the process of slowing down and will likely retire in the next few years, would it be possible to establish Mr. Brownlow with a different provider so that we can optimize his continuity of care?  Thanks.  Thayer Ohm

## 2020-01-03 ENCOUNTER — Other Ambulatory Visit: Payer: Self-pay | Admitting: Endocrinology

## 2020-01-17 ENCOUNTER — Other Ambulatory Visit: Payer: Self-pay | Admitting: Orthopaedic Surgery

## 2020-01-17 DIAGNOSIS — M25511 Pain in right shoulder: Secondary | ICD-10-CM

## 2020-01-22 ENCOUNTER — Other Ambulatory Visit: Payer: Self-pay | Admitting: Endocrinology

## 2020-01-26 ENCOUNTER — Ambulatory Visit
Admission: RE | Admit: 2020-01-26 | Discharge: 2020-01-26 | Disposition: A | Discharge status: Home / Self Care | Payer: 59 | Source: Ambulatory Visit | Attending: Orthopaedic Surgery | Admitting: Orthopaedic Surgery

## 2020-01-26 ENCOUNTER — Other Ambulatory Visit: Payer: Self-pay

## 2020-01-26 DIAGNOSIS — M25511 Pain in right shoulder: Secondary | ICD-10-CM

## 2020-01-26 MED ORDER — IOPAMIDOL (ISOVUE-M 200) INJECTION 41%
15.0000 mL | Freq: Once | INTRAMUSCULAR | Status: AC
  Administered 2020-01-26: 15 mL via INTRA_ARTICULAR

## 2020-01-30 ENCOUNTER — Other Ambulatory Visit: Payer: Self-pay | Admitting: *Deleted

## 2020-01-30 MED ORDER — OMNIPOD DASH PODS (GEN 4) MISC: 1 refills | Status: DC

## 2020-01-31 ENCOUNTER — Telehealth: Payer: Self-pay | Admitting: Internal Medicine

## 2020-01-31 NOTE — Telephone Encounter (Signed)
   Primary Cardiologist: Yvonne Kendall, MD  Chart reviewed as part of pre-operative protocol coverage. The patient was doing well when seen by Dr. Okey Dupre 12/25/19. Per OV note "hx of STEMI with primary PCI to the distal RCA in June.  He has moderate to severe disease involving the LCx, which was not intervened upon at that time.  Given the lack of symptoms, I recommend continued aggressive medical therapy and secondary prevention.  We will plan to complete 12 months of dual antiplatelet therapy with aspirin and prasugrel, which Todd Gaines is tolerating well".  Given past medical history and time since last visit, based on ACC/AHA guidelines, Todd Gaines would be at acceptable risk for the planned procedure without further cardiovascular testing.   Dr. Okey Dupre, Please give your recommendations regarding DAPT. Please forward your response to [facility] PREOP.   Thank you  Manson Passey, PA 01/31/2020, 10:41 AM

## 2020-01-31 NOTE — Telephone Encounter (Signed)
   [facility] Pre-operative Risk Assessment    HEARTCARE STAFF: - Please ensure there is not already an duplicate clearance open for this procedure. - Under Visit Info/Reason for Call, type in Other and utilize the format Clearance MM/DD/YY or Clearance TBD. Do not use dashes or single digits. - If request is for dental extraction, please clarify the # of teeth to be extracted.  Request for surgical clearance:  1. What type of surgery is being performed? R shoulder scope Rotater cuff repair   2. When is this surgery scheduled? TBD   3. What type of clearance is required (medical clearance vs. Pharmacy clearance to hold med vs. Both)? both  4. Are there any medications that need to be held prior to surgery and how long?asa and prasugrel  Please advise   5. Practice name and name of physician performing surgery? [facility]    6. What is the office phone number? [phone]   7.   What is the office fax number? [phone]  8.   Anesthesia type (None, local, MAC, general) ? Not noted    Clarisse Gouge 01/31/2020, 9:37 AM  _________________________________________________________________   (provider comments below)

## 2020-01-31 NOTE — Telephone Encounter (Signed)
Todd Gaines is stable to proceed with shoulder surgery.  Ideally, elective surgery should be deferred until he has completed 12 months of dual antiplatelet therapy (after 09/07/2020).  If surgery cannot be delayed that long, I would prefer that he complete at least 6 months of DAPT, which would be the [name] of December.  At that point, prasugrel can be held for 7 days before the procedure and restarted when it is felt safe to do so from a surgical standpoint.  Aspirin 81 mg daily should be continued during the perioperative period.  Yvonne Kendall, MD [facility]

## 2020-02-13 ENCOUNTER — Ambulatory Visit (INDEPENDENT_AMBULATORY_CARE_PROVIDER_SITE_OTHER): Payer: 59 | Admitting: Endocrinology

## 2020-02-13 ENCOUNTER — Other Ambulatory Visit: Payer: Self-pay

## 2020-02-13 ENCOUNTER — Encounter: Payer: Self-pay | Admitting: Endocrinology

## 2020-02-13 VITALS — BP 124/82 | HR 82 | Ht 73.0 in | Wt 272.6 lb

## 2020-02-13 DIAGNOSIS — E1165 Type 2 diabetes mellitus with hyperglycemia: Secondary | ICD-10-CM

## 2020-02-13 DIAGNOSIS — Z23 Encounter for immunization: Secondary | ICD-10-CM | POA: Diagnosis not present

## 2020-02-13 DIAGNOSIS — Z794 Long term (current) use of insulin: Secondary | ICD-10-CM

## 2020-02-13 LAB — POCT GLYCOSYLATED HEMOGLOBIN (HGB A1C): Hemoglobin A1C: 6.2 % — AB (ref 4.0–5.6)

## 2020-02-13 MED ORDER — VICTOZA 18 MG/3ML ~~LOC~~ SOPN: PEN_INJECTOR | SUBCUTANEOUS | 0 refills | Status: DC

## 2020-02-13 MED ORDER — FREESTYLE LIBRE 2 SENSOR MISC: 2.0000 | 3 refills | Status: DC

## 2020-02-13 NOTE — Progress Notes (Signed)
Patient ID: Todd Gaines, male   DOB: 04-27-1975, 44 y.o.   MRN: [id number]           Reason for Appointment: Follow-up for Type 2 Diabetes  Referring physician: [facility]   History of Present Illness:          Date of diagnosis of type 2 diabetes mellitus: 2012       Background history:   He believes her A1c was 12% when he was first diagnosed to have diabetes and was having symptoms of feeling very tired. Initially he made changes in his lifestyle and was able to lose some weight also He was probably treated with metformin and may have been given Tradjenta subsequently Previous records are not available He thinks his A1c had been around 7-7.5% for some time until last year He was started on basal insulin in 9/16 when his A1c was 9.9 He did not use the V-go pump because of lack of adequate insurance coverage and out-of-pocket expense, this was improving his control  Recent history:   OMNIPOD PUMP SETTINGS: Basal rate 1.25 midnight-6 AM and then 1.5 Carbohydrate ratio 1:1, correction 1: 50 and target 120-140, active insulin 4 hours    INSULIN regimen previously: lantus 40 units in the evening, Humalog before  dinner usually 10-12 units  Non-insulin hypoglycemic drugs: Metformin ER 2000 mg a day, Victoza 1.8 mg daily, Invokana 300 mg daily   his A1c is 6.2 and continues to be below 7   Also previous fructosamine is excellent at 258 compared to previous readings  Current blood sugar patterns and problems identified:    He did not have his PDM or his pump supplies consistently over the last couple of months or so  Difficult to assess his blood sugars as he has not checked his sugars much and has only limited data especially with his pump usage  He did not bring his meter for download also  He thinks his blood sugars are high later in the evenings but this is likely to be from forgetting to bolus  Usually blood sugars are over 200 if he does not take a bolus and then he  will do a correction bolus later  He is generally having good fasting blood sugars in the 120-130 range  No hypoglycemia also even when he is active at work  Even with getting basal insulin of about 36 units he is bolusing only about 2 unit usually for his meals but difficult to judge his postprandial readings since his evening boluses are after eating  Today his blood sugar after lunch was 170 with 2 units bolus  He was told to take Invokana in the mornings and he is doing that in the morning  Exercise: active at work   Side effects from medications have been: Transient nausea from Trulicity  Compliance with the medical regimen: Fair    Glucose monitoring:  0-1 times a day         Glucometer:  One Touch  Blood Glucose readings    PRE-MEAL Fasting Lunch Dinner Bedtime Overall  Glucose range:  123-134   132  148-263   Mean/median:      175   POST-MEAL PC Breakfast PC Lunch PC Dinner  Glucose range:   198   Mean/median:      Previous data:  PRE-MEAL Fasting Lunch Dinner Bedtime Overall  Glucose range:  110-136  93  185  151-255   Mean/median:      158  POST-MEAL PC Breakfast PC Lunch PC Dinner  Glucose range:  180    Mean/median:        Self-care:  Typical meal intake: Breakfast is eggs, sausage sometimes, otherwise may have breakfast burrito  Snacks will be peanut butter crackers, chips, pretzels and peanut butter               Dietician visit, most recent: 10/16                Weight history: 275-298 lbs  Wt Readings from Last 3 Encounters:  02/13/20 272 lb 9.6 oz (123.7 kg)  12/25/19 263 lb 9.6 oz (119.6 kg)  11/14/19 264 lb (119.7 kg)    Glycemic control:   Lab Results  Component Value Date   HGBA1C 6.2 (A) 02/13/2020   HGBA1C 6.6 (H) 11/09/2019   HGBA1C 8.7 (H) 07/03/2019   Lab Results  Component Value Date   MICROALBUR <0.7 11/09/2019   LDLCALC 50 09/08/2019   CREATININE 0.92 11/09/2019    Office Visit on 02/13/2020  Component Date Value  Ref Range Status  . Hemoglobin A1C 02/13/2020 6.2* 4.0 - 5.6 % Final      Allergies as of 02/13/2020   No Known Allergies     Medication List       Accurate as of February 13, 2020  2:20 PM. If you have any questions, ask your nurse or doctor.        aspirin 81 MG EC tablet Take 1 tablet (81 mg total) by mouth daily. Swallow whole.   atorvastatin 80 MG tablet Commonly known as: LIPITOR Take 80 mg by mouth daily.   atorvastatin 80 MG tablet Commonly known as: LIPITOR Take 1 tablet (80 mg total) by mouth daily.   Contour Next One Kit 1 each by Does not apply route in the morning, at noon, in the evening, and at bedtime. Use Bayer Contour Next One meter to check blood sugar 4 times daily.   FreeStyle Libre 2 Sensor Misc 2 Devices by Does not apply route every 14 (fourteen) days. Started by: Elayne Snare, MD   glucose blood test strip Use OneTouch Ultra test strips as instructed to check blood sugar 4 times daily. DX:E11.65   insulin lispro 100 UNIT/ML injection Commonly known as: HumaLOG Use max of 55 units daily via insulin pump.   Invokana 300 MG Tabs tablet Generic drug: canagliflozin TAKE ONE TABLET BY MOUTH DAILY BEFORE BREAKFST   metFORMIN 500 MG 24 hr tablet Commonly known as: GLUCOPHAGE-XR TAKE FOUR TABLETS BY MOUTH DAILY   metoprolol tartrate 25 MG tablet Commonly known as: LOPRESSOR Take 1 tablet (25 mg total) by mouth 2 (two) times daily.   nitroGLYCERIN 0.4 MG SL tablet Commonly known as: NITROSTAT Place 1 tablet (0.4 mg total) under the tongue every 5 (five) minutes as needed for chest pain.   NovoTwist 32G X 5 MM Misc Generic drug: Insulin Pen Needle Use one per day to inject victoza   OmniPod Dash 5 Pack Pods Misc Apply one pod to body once every 3 days for insulin delivery.   prasugrel 10 MG Tabs tablet Commonly known as: EFFIENT Take 1 tablet (10 mg total) by mouth daily. Last day 09/08/20.   Stelara 90 MG/ML Sosy injection Generic  drug: ustekinumab Inject 90 mg into the skin once. Every 3 months.   Victoza 18 MG/3ML Sopn Generic drug: liraglutide 1.8 mg qd What changed: See the new instructions. Changed by: Elayne Snare, MD  Allergies: No Known Allergies  Past Medical History:  Diagnosis Date  . CAD (coronary artery disease)   . Diabetes mellitus without complication ([place])   . Elevated cholesterol   . Psoriasis     Past Surgical History:  Procedure Laterality Date  . CORONARY STENT INTERVENTION N/A 09/08/2019   Procedure: CORONARY STENT INTERVENTION;  Surgeon: Nelva Bush, MD;  Location: [facility];  Service: Cardiovascular;  Laterality: N/A;  . LEFT HEART CATH AND CORONARY ANGIOGRAPHY N/A 09/08/2019   Procedure: LEFT HEART CATH AND CORONARY ANGIOGRAPHY;  Surgeon: Nelva Bush, MD;  Location: [facility];  Service: Cardiovascular;  Laterality: N/A;    Family History  Problem Relation Age of Onset  . Diabetes Mother   . Diabetes Paternal Grandfather   . Hypertension Paternal Grandfather   . Heart disease Neg Hx   . Cancer Neg Hx     Social History:  reports that he quit smoking about 17 years ago. His smoking use included cigarettes. He has a 2.50 pack-year smoking history. He has never used smokeless tobacco. He reports that he does not drink alcohol and does not use drugs.    Review of Systems    Lipid history: On treatment with Lipitor 80 mg Previously LDL below 100 but now with his recent coronary artery disease he has been told to follow-up on the dose  Has persistently low HDL and high triglycerides   Lab Results  Component Value Date   CHOL 123 09/08/2019   HDL 27 (L) 09/08/2019   LDLCALC 50 09/08/2019   LDLDIRECT 90.0 06/27/2019   TRIG 229 (H) 09/08/2019   CHOLHDL 4.6 09/08/2019             Blood pressure normal without medications  No renal dysfunction with Invokana 300 mg  BP Readings from Last 3 Encounters:  02/13/20 124/82  12/25/19  116/84  11/14/19 110/70    Lab Results  Component Value Date   CREATININE 0.92 11/09/2019   CREATININE 0.91 09/09/2019   CREATININE 0.96 09/08/2019       Physical Examination:  BP 124/82   Pulse 82   Ht 6' 1" (1.854 m)   Wt 272 lb 9.6 oz (123.7 kg)   SpO2 96%   BMI 35.97 kg/m        ASSESSMENT:  Diabetes type 2, with obesity  See history of present illness for detailed discussion of his current management, blood sugar patterns and problems identified  His A1c is again better than usual at 6.2  He is using the Omni pod insulin pump, Victoza, Metformin and Invokana  His insulin pump download information was incomplete since he is not using this consistently As before most of his insulin requirement appears to be in basal usage Despite discussing that he needs to set up alerts and reminders to bolus before eating in the evening he is forgetting to do so and frequently has readings over 200 after dinner Otherwise blood sugars appear to be fairly good but limited data available today  Discuss his day-to-day management, basal and bolus insulin, blood sugar targets  Lipids: Will need fasting labs for repeating triglycerides to consider using Vascepa  PLAN:    He will need to have his wife remind him to take a bolus when he is ready to eat dinner in the evening Boluses for meals will be about at least 2 units and preferably 3 units if not planning to be active Try using the freestyle libre again and he  will start to use the skin tac also to help the sensor stick He will let us know if he needs any assistance with setting up his app on the phone for the [place] version 2 Would be preferable closer of his high alert at 200 instead of 240 We will need to adjust his boluses based on blood sugar before and after meals No change in other medications  Follow-up with lipids in 3 months  There are no Patient Instructions on file for this visit.  Flu vaccine given   Elayne Snare 02/13/2020, 2:20 PM   Note: This office note was prepared with Dragon voice recognition system technology. Any transcriptional errors that result from this process are unintentional.

## 2020-02-14 ENCOUNTER — Other Ambulatory Visit: Payer: Self-pay | Admitting: *Deleted

## 2020-02-14 MED ORDER — FREESTYLE LIBRE 2 SENSOR MISC: 2.0000 | 1 refills | Status: DC

## 2020-02-19 ENCOUNTER — Other Ambulatory Visit: Payer: Self-pay | Admitting: Endocrinology

## 2020-03-12 NOTE — Telephone Encounter (Signed)
Please schedule appropriately.

## 2020-03-13 NOTE — Telephone Encounter (Signed)
Called Todd Gaines to determine which Provider he is wanting to switch to in order to request a provider switch from both providers before rescheduling.  Patient states he was not aware of the switch from Dr. Elease Hashimoto being dicussed.

## 2020-03-21 ENCOUNTER — Telehealth: Payer: Self-pay | Admitting: Family Medicine

## 2020-03-21 NOTE — Telephone Encounter (Signed)
Todd Gaines - Client TELEPHONE ADVICE RECORD [facility] Patient Name: Todd Gaines Gender: Male DOB: 26-Dec-1975 Age: 45 Y 8 M 16 D Return Phone Number: [phone] (Primary), [phone] (Secondary) Address[place] [address] Client Todd Gaines Physician Todd Gaines, Idamae Schuller - MD Contact Type Call Who Is Calling Patient / Member / Family / Caregiver Call Type Triage / Clinical Relationship To Patient Self Return Phone Number [phone] (Primary) Chief Complaint Cold Symptom Reason for Call Symptomatic / Request for Health Information Initial Comment Caller states he tested positive for covid and wants to know the next steps. His sx are head cold and runny nose. Translation No Nurse Assessment Nurse: Doylene Canard, RN, Rinaldo Cloud Date/Time (Eastern Time): 03/21/2020 9:53:02 AM Confirm and document reason for call. If symptomatic, describe symptoms. ---Caller states he is Covid positive with cold symptoms. Does the patient have any new or worsening symptoms? ---Yes Will a triage be completed? ---Yes Related visit to physician within the last 2 weeks? ---No Does the PT have any chronic conditions? (i.e. diabetes, asthma, this includes High risk factors for pregnancy, etc.) ---Yes List chronic conditions. ---Diabetes, Heart stent, Psoriasis Is this a behavioral health or substance abuse call? ---No Guidelines Guideline Title Affirmed Question Affirmed Notes Nurse Date/Time (Eastern Time) COVID-19 - Diagnosed or Suspected [1] COVID-19 diagnosed by positive lab test (e.g., PCR, rapid selftest kit) AND [2] mild symptoms (e.g., cough, fever, others) AND [3] no complications or SOB Conner, RN, Rinaldo Cloud 03/21/2020 9:55:19 AM Disp. Time [name] Time) Disposition Final User 03/21/2020 9:58:56 AM Home Care Yes Conner, RN, Rinaldo Cloud PLEASE NOTE: All timestamps  contained within this report are represented as [place] Standard Time. CONFIDENTIALTY NOTICE: This fax transmission is intended only for the addressee. It contains information that is legally privileged, confidential or otherwise protected from use or disclosure. If you are not the intended recipient, you are strictly prohibited from reviewing, disclosing, copying using or disseminating any of this information or taking any action in reliance on or regarding this information. If you have received this fax in error, please notify us immediately by telephone so that we can arrange for its return to [place]. Phone: [phone], Toll-Free: [phone], Fax: [phone] Page: 2 of 2 Call Id: [id number] Caller Disagree/Comply Comply Caller Understands Yes PreDisposition Did not know what to do Care Advice Given Per Guideline HOME CARE: * You should be able to treat this at home. GENERAL CARE ADVICE FOR COVID-19 SYMPTOMS: * Feeling dehydrated: Drink extra liquids. If the air in your home is dry, use a humidifier. * Fever: For fever over 101 F (38.3 C), take acetaminophen every 4 to 6 hours (Adults 650 mg) OR ibuprofen every 6 to 8 hours (Adults 400 mg). Before taking any medicine, read all the instructions on the package. Do not take aspirin unless your doctor has prescribed it for you. * Muscle aches, headache, and other pains: Often this comes and goes with the fever. Take acetaminophen every 4 to 6 hours (Adults 650 mg) OR ibuprofen every 6 to 8 hours (Adults 400 mg). Before taking any medicine, read all the instructions on the package. HUMIDIFIER: * If the air is dry, use a humidifier in the bedroom. * Dry air makes coughs worse. NO ASPIRIN: * Do not use aspirin for treatment of fever or pain. HOW TO PROTECT OTHERS - WHEN YOU ARE SICK WITH COVID-19: * STAY HOME A MINIMUM OF 10 DAYS: Home  isolation is needed for at least 10 days after the symptoms started. Stay home from school or work if you are sick. Do  NOT go to religious services, child care centers, shopping, or other public places. Do NOT use public transportation (e.g., bus, taxis, ridesharing). Do NOT allow any visitors to your home. Leave the house only if you need to seek urgent medical care. * COVER THE COUGH: Cough and sneeze into your shirt sleeve or inner elbow. Don't cough into your hand or the air. If available, cough into a tissue and throw it into a trash can. * [place] HANDS OFTEN: Wash hands often with soap and water. After coughing or sneezing are important times. If soap and water are not available, use an alcohol-based hand sanitizer with at least 60% alcohol, covering all surfaces of your hands and rubbing them together until they feel dry. Avoid touching your eyes, nose, and mouth with unwashed hands. * WEAR A MASK: Wear a facemask when around others. Always wear a facemask (if available) if you have to leave your home (such as going to a medical facility). * Reason: there is an association between influenza and Arvin Collard' Syndrome. CALL BACK IF: * You become worse CARE ADVICE given per COVID-19 - DIAGNOSED OR SUSPECTED (Adult) guideline.

## 2020-03-21 NOTE — Telephone Encounter (Signed)
I spoke with the patient he stated the only sxs he currently has are, runny nose, with nasal congestion. He denies sob, fevers, aches or chills. He didn't feel a virtual visit would be needed as he would "self medicate with otc meds if needed"  His question to PCP: "Do I continue to take my baby aspirin" I advised him I would sned a message and someone will get in touch with him with an answer.  Please advise

## 2020-03-21 NOTE — Telephone Encounter (Signed)
It is ok to continue the baby asa

## 2020-03-21 NOTE — Telephone Encounter (Signed)
Pt called in wanted to let Dr. Milinda Antis know he has covid

## 2020-03-21 NOTE — Telephone Encounter (Signed)
Spoke to pt told him Dr. Milinda Antis said okay to continue Baby Aspirin. Pt verbalized understanding.

## 2020-03-21 NOTE — Telephone Encounter (Signed)
Please see message. °

## 2020-03-21 NOTE — Telephone Encounter (Signed)
See note below the access nurse note where CMA has already spoken with pt.

## 2020-03-21 NOTE — Telephone Encounter (Signed)
Thanks for letting me know  How are symptoms? Does he want to set up a virtual visit?  If so -put him on for tomorrow

## 2020-04-04 ENCOUNTER — Ambulatory Visit: Payer: 59 | Admitting: Cardiovascular Disease

## 2020-04-04 ENCOUNTER — Other Ambulatory Visit: Payer: 59

## 2020-04-08 ENCOUNTER — Other Ambulatory Visit: Payer: Self-pay | Admitting: Endocrinology

## 2020-04-08 MED ORDER — EMPAGLIFLOZIN 25 MG PO TABS: 25.0000 mg | ORAL_TABLET | Freq: Every day | ORAL | 0 refills | Status: DC

## 2020-04-17 ENCOUNTER — Encounter: Payer: Self-pay | Admitting: Internal Medicine

## 2020-04-17 ENCOUNTER — Other Ambulatory Visit: Payer: Self-pay

## 2020-04-17 ENCOUNTER — Ambulatory Visit (INDEPENDENT_AMBULATORY_CARE_PROVIDER_SITE_OTHER): Payer: 59 | Admitting: Internal Medicine

## 2020-04-17 VITALS — BP 108/72 | HR 76 | Ht 73.0 in | Wt 278.0 lb

## 2020-04-17 DIAGNOSIS — I2510 Atherosclerotic heart disease of native coronary artery without angina pectoris: Secondary | ICD-10-CM

## 2020-04-17 DIAGNOSIS — I712 Thoracic aortic aneurysm, without rupture, unspecified: Secondary | ICD-10-CM | POA: Insufficient documentation

## 2020-04-17 DIAGNOSIS — E1159 Type 2 diabetes mellitus with other circulatory complications: Secondary | ICD-10-CM | POA: Insufficient documentation

## 2020-04-17 DIAGNOSIS — E6609 Other obesity due to excess calories: Secondary | ICD-10-CM | POA: Insufficient documentation

## 2020-04-17 DIAGNOSIS — I152 Hypertension secondary to endocrine disorders: Secondary | ICD-10-CM

## 2020-04-17 DIAGNOSIS — Z01810 Encounter for preprocedural cardiovascular examination: Secondary | ICD-10-CM | POA: Insufficient documentation

## 2020-04-17 DIAGNOSIS — E669 Obesity, unspecified: Secondary | ICD-10-CM

## 2020-04-17 DIAGNOSIS — E1169 Type 2 diabetes mellitus with other specified complication: Secondary | ICD-10-CM

## 2020-04-17 DIAGNOSIS — E782 Mixed hyperlipidemia: Secondary | ICD-10-CM | POA: Insufficient documentation

## 2020-04-17 NOTE — Progress Notes (Addendum)
Cardiology Office Note:    Date:  04/17/2020   ID:  Todd Gaines, DOB 03-29-1975, MRN [id number]  Pronounced; Per' mar  PCP:  Judy Pimple, MD  [facility] Cardiologist:  Yvonne Kendall, MD  [facility] Electrophysiologist:  None   CC:  Re-establish care ([place] to [facility])  History of Present Illness:    Todd Gaines is a 45 y.o. male with a hx of CAD (NSTEMI 6/21, DM with HTN, DM with HLD, Mild thoracic aortic aneurysm who presents for evaluation 04/17/20 (transition from Dr. Okey Dupre to [place] location).  Had been seen in follow up 12/25/2019 with resolution of chest pain.  Initial angina equivalent is chest heaviness.  Patient notes that he is doing well.  Since last visit notes that he doesn't have his wind back yet.  Hasn't got back into exercising yet.  Relevant interval testing or therapy include not doing cardiac rehab.  There are no interval hospital/ED visit.    No chest pain or pressure .  No SOB but has slight DOE (doesn't feel like he has the exercise capacity he used to have) and no PND/Orthopnea.  Notes weight gain or leg swelling.  No palpitations or syncope.  No bleeding issues.  Built a bull pen for her daughter's travel softball team and felt winded.     Past Medical History:  Diagnosis Date  . CAD (coronary artery disease)   . Diabetes mellitus without complication (HCC)   . Elevated cholesterol   . Psoriasis     Past Surgical History:  Procedure Laterality Date  . CORONARY STENT INTERVENTION N/A 09/08/2019   Procedure: CORONARY STENT INTERVENTION;  Surgeon: Yvonne Kendall, MD;  Location: [facility] INVASIVE CV LAB;  Service: Cardiovascular;  Laterality: N/A;  . LEFT HEART CATH AND CORONARY ANGIOGRAPHY N/A 09/08/2019   Procedure: LEFT HEART CATH AND CORONARY ANGIOGRAPHY;  Surgeon: Yvonne Kendall, MD;  Location: [facility] INVASIVE CV LAB;  Service: Cardiovascular;  Laterality: N/A;    Current Medications: Current Meds  Medication Sig  . aspirin EC 81 MG  EC tablet Take 1 tablet (81 mg total) by mouth daily. Swallow whole.  Marland Kitchen atorvastatin (LIPITOR) 80 MG tablet Take 80 mg by mouth daily.  . Blood Glucose Monitoring Suppl (CONTOUR NEXT ONE) KIT 1 each by Does not apply route in the morning, at noon, in the evening, and at bedtime. Use Bayer Contour Next One meter to check blood sugar 4 times daily.  . Continuous Blood Gluc Sensor (FREESTYLE LIBRE 2 SENSOR) MISC CHANGE SENSOR EVERY 14 DAYS  . empagliflozin (JARDIANCE) 25 MG TABS tablet Take 1 tablet (25 mg total) by mouth daily before breakfast. Start when Invokana finished  . glucose blood test strip Use OneTouch Ultra test strips as instructed to check blood sugar 4 times daily. DX:E11.65  . Insulin Disposable Pump (OMNIPOD DASH 5 PACK PODS) MISC Apply one pod to body once every 3 days for insulin delivery.  . insulin lispro (HUMALOG) 100 UNIT/ML injection Use max of 55 units daily via insulin pump.  . Insulin Pen Needle (NOVOTWIST) 32G X 5 MM MISC Use one per day to inject victoza  . metFORMIN (GLUCOPHAGE-XR) 500 MG 24 hr tablet TAKE FOUR TABLETS BY MOUTH DAILY  . metoprolol tartrate (LOPRESSOR) 25 MG tablet Take 25 mg by mouth 2 (two) times daily.  . nitroGLYCERIN (NITROSTAT) 0.4 MG SL tablet Place 1 tablet (0.4 mg total) under the tongue every 5 (five) minutes as needed for chest pain.  . prasugrel (EFFIENT)  10 MG TABS tablet Take 1 tablet (10 mg total) by mouth daily. Last day 09/08/20.  Delsa Grana 90 MG/ML SOSY injection Inject 90 mg into the skin once. Every 3 months.  Marland Kitchen VICTOZA 18 MG/3ML SOPN 1.8 mg qd  . [DISCONTINUED] atorvastatin (LIPITOR) 80 MG tablet Take 80 mg by mouth daily.     Allergies:   Patient has no known allergies.   Social History   Socioeconomic History  . Marital status: Married    Spouse name: Not on file  . Number of children: Not on file  . Years of education: Not on file  . Highest education level: Not on file  Occupational History  . Not on file  Tobacco Use   . Smoking status: Former Smoker    Packs/day: 0.25    Years: 10.00    Pack years: 2.50    Types: Cigarettes    Quit date: 04/17/2002    Years since quitting: 18.0  . Smokeless tobacco: Never Used  Vaping Use  . Vaping Use: Never used  Substance and Sexual Activity  . Alcohol use: No    Alcohol/week: 0.0 standard drinks  . Drug use: No  . Sexual activity: Yes  Other Topics Concern  . Not on file  Social History Narrative  . Not on file   Social Determinants of Health   Financial Resource Strain: Not on file  Food Insecurity: Not on file  Transportation Needs: Not on file  Physical Activity: Not on file  Stress: Not on file  Social Connections: Not on file     Family History: The patient's family history includes Diabetes in his mother and paternal grandfather; Hypertension in his paternal grandfather. There is no history of Heart disease or Cancer.  History of coronary artery disease notable for no members. History of heart failure notable for no members. No history of cardiomyopathies including hypertrophic cardiomyopathy, left ventricular non-compaction, or arrhythmogenic right ventricular cardiomyopathy. History of arrhythmia notable for no members. Grandfather died of sudden cardiac death at the age of 78 No history of bicuspid aortic valve or aortic aneurysm or dissection.  ROS:   Please see the history of present illness.     All other systems reviewed and are negative.  EKGs/Labs/Other Studies Reviewed:    The following studies were reviewed today:  EKG:   01/05/20: SR 66 inferior TWI  Transthoracic Echocardiogram: Date: 04/17/20 Results: AHI 2.16 cm/m normal EF 1. Left ventricular ejection fraction, by estimation, is 55 to 60%. The  left ventricle has normal function. The left ventricle demonstrates  regional wall motion abnormalities (see scoring diagram/findings for  description). There is mild left ventricular  hypertrophy. Left ventricular diastolic  parameters were normal. There is  severe hypokinesis of the left ventricular, basal-mid inferior wall and  inferoseptal wall.  2. Right ventricular systolic function is normal. The right ventricular  size is normal. Tricuspid regurgitation signal is inadequate for assessing  PA pressure.  3. The mitral valve is grossly normal. No evidence of mitral valve  regurgitation. No evidence of mitral stenosis.  4. The aortic valve has an indeterminant number of cusps. Aortic valve  regurgitation is not visualized. No aortic stenosis is present.  5. Aortic dilatation noted. There is mild dilatation of the aortic root  measuring 40 mm.  6. The inferior vena cava is normal in size with <50% respiratory  variability, suggesting right atrial pressure of 8 mmHg.   Left/Right Heart Catheterizations: Date: 09/08/19 Results: Conclusions: 1. Two-vessel coronary artery  disease with 60% distal LCx stenosis and thrombotic occlusion of the distal RCA. 2. Mildly elevated left ventricular filling pressure (LVEDP ~20 mmHg). 3. Successful PCI to distal RCA using resolute Onyx 3.0 x 38 mm drug-eluting stent with 0% residual stenosis and TIMI-3 flow.  Recommendations: 1. Dual antiplatelet therapy with aspirin and prasugrel for 12 months. 2. Aggressive secondary prevention. 3. Favor medical therapy of moderate distal LCx disease.  If Mr. Sheldon has recurrent chest pain, functional assessment of this lesion and PCI, if hemodynamically significant, will need to be considered.    Recent Labs: 06/27/2019: TSH 1.06 09/09/2019: Magnesium 2.1 09/21/2019: Hemoglobin 15.1; Platelets 274 11/09/2019: ALT 11; BUN 16; Creatinine, Ser 0.92; Potassium 4.9; Sodium 137  Recent Lipid Panel    Component Value Date/Time   CHOL 123 09/08/2019 0249   TRIG 229 (H) 09/08/2019 0249   HDL 27 (L) 09/08/2019 0249   CHOLHDL 4.6 09/08/2019 0249   VLDL 46 (H) 09/08/2019 0249   LDLCALC 50 09/08/2019 0249   LDLDIRECT 90.0 06/27/2019  1507     Risk Assessment/Calculations:     N/A  Physical Exam:    VS:  BP 108/72   Pulse 76   Ht $R'6\' 1"'aj$  (1.854 m)   Wt 278 lb (126.1 kg)   SpO2 95%   BMI 36.68 kg/m     Wt Readings from Last 3 Encounters:  04/17/20 278 lb (126.1 kg)  02/13/20 272 lb 9.6 oz (123.7 kg)  12/25/19 263 lb 9.6 oz (119.6 kg)     GEN: Obese well developed in no acute distress HEENT: Normal NECK: No JVD; No carotid bruits LYMPHATICS: No lymphadenopathy CARDIAC: RRR, no murmurs, rubs, gallops RESPIRATORY:  Clear to auscultation without rales, wheezing or rhonchi  ABDOMEN: Soft, non-tender, non-distended MUSCULOSKELETAL:  No edema; No deformity  SKIN: Warm and dry NEUROLOGIC:  Alert and oriented x 3 PSYCHIATRIC:  Normal affect   ASSESSMENT:    1. Obesity, diabetes, and hypertension syndrome ([place])   2. Morbid obesity ([place])   3. Coronary artery disease involving native coronary artery of native heart without angina pectoris   4. Preop cardiovascular exam   5. Mixed hyperlipidemia   6. Thoracic aortic aneurysm without rupture (HCC)    PLAN:    Coronary Artery Disease; Obstructive HLD Preoperative risk eval - asymptomatic  - anatomy:  60% distal LCx stenosis  - continue ASA 81 mg; Continue DAPT until 6/22:  Discussed plavix vs effient and timing of his surgery - continue statin, goal LDL < 70 - continue BB - continue PRN nitrates;  PDEi - continue ACEi - discussed cardiac rehab - patient is on Jardiance SHARED DECISION MAKING:  Has RCRI of 1 and is low risk for perioperative MI; and 4+ fMEts.  Would be reasonable for surgery.  Given his LCx disease ideal we will see him in May-June; DC his effient and if no changes will go to surgery for his arm after May.  Morbid Obesity/DM/HTN - ambulatory blood pressure stable willcontinue ambulatory BP monitoring; gave education on how to perform ambulatory blood pressure monitoring including the frequency and technique; goal ambulatory blood  pressure < 135/85 on average - continue home medications  - discussed diet (DASH/low sodium), and exercise/weight loss interventions   Asymptomatic thoracic/abdominal aortic aneurysm - Last at 40 mm, rate of growth unclear indexed diameter < 2.4 - Last scan June 201; will need repeat inMay 2022 - Primary Prevention:  HTN, CAD, as above - 1st degree relative one time screening briefly discussed  given grandfather hx - Will defer not using Fluoroquinolones until last visit -  no history of syphilis so deferring TPHA, micro-haemagglutination test, flourescent treponemal antibody absorption test and no 21 days of therapy with benzathine penicillin after surgery would be required; no HIV history -Echo at next visit should view SSN for evaluation of Coarctation  June 3rd 2022 follow up unless new symptoms or abnormal test results warranting change in plan  Would be reasonable for  APP Follow up    ADDEDNUM:  Original text mis-gendered patient in plan.  Medication Adjustments/Labs and Tests Ordered: Current medicines are reviewed at length with the patient today.  Concerns regarding medicines are outlined above.  Orders Placed This Encounter  Procedures  . ECHOCARDIOGRAM COMPLETE   No orders of the defined types were placed in this encounter.   Patient Instructions  Medication Instructions:  Your physician recommends that you continue on your current medications as directed. Please refer to the Current Medication list given to you today.  *If you need a refill on your cardiac medications before your next appointment, please call your pharmacy*   Lab Work: NONE If you have labs (blood work) drawn today and your tests are completely normal, you will receive your results only by: Marland Kitchen MyChart Message (if you have MyChart) OR . A paper copy in the mail If you have any lab test that is abnormal or we need to change your treatment, we will call you to review the  results.   Testing/Procedures: Your physician has requested that you have an echocardiogram. Echocardiography is a painless test that uses sound waves to create images of your heart. It provides your doctor with information about the size and shape of your heart and how well your heart's chambers and valves are working. This procedure takes approximately one hour. There are no restrictions for this procedure.     Follow-Up: At [facility], you and your health needs are our priority.  As part of our continuing mission to provide you with exceptional heart care, we have created designated Provider Care Teams.  These Care Teams include your primary Cardiologist (physician) and Advanced Practice Providers (APPs -  Physician Assistants and Nurse Practitioners) who all work together to provide you with the care you need, when you need it.  We recommend signing up for the patient portal called "MyChart".  Sign up information is provided on this After Visit Summary.  MyChart is used to connect with patients for Virtual Visits (Telemedicine).  Patients are able to view lab/test results, encounter notes, upcoming appointments, etc.  Non-urgent messages can be sent to your provider as well.   To learn more about what you can do with MyChart, go to [email or web address].    Your next appointment:   3 month(s) After ECHO, Late May early June  The format for your next appointment:   In Person  Provider:   You may see Gasper Sells, MD or one of the following Advanced Practice Providers on your designated Care Team:    Melina Copa, PA-C  Ermalinda Barrios, PA-C        Signed, Werner Lean, MD  04/17/2020 9:43 AM    [place]

## 2020-04-17 NOTE — Patient Instructions (Signed)
Medication Instructions:  Your physician recommends that you continue on your current medications as directed. Please refer to the Current Medication list given to you today.  *If you need a refill on your cardiac medications before your next appointment, please call your pharmacy*   Lab Work: NONE If you have labs (blood work) drawn today and your tests are completely normal, you will receive your results only by: Marland Kitchen MyChart Message (if you have MyChart) OR . A paper copy in the mail If you have any lab test that is abnormal or we need to change your treatment, we will call you to review the results.   Testing/Procedures: Your physician has requested that you have an echocardiogram. Echocardiography is a painless test that uses sound waves to create images of your heart. It provides your doctor with information about the size and shape of your heart and how well your heart's chambers and valves are working. This procedure takes approximately one hour. There are no restrictions for this procedure.     Follow-Up: At [facility], you and your health needs are our priority.  As part of our continuing mission to provide you with exceptional heart care, we have created designated Provider Care Teams.  These Care Teams include your primary Cardiologist (physician) and Advanced Practice Providers (APPs -  Physician Assistants and Nurse Practitioners) who all work together to provide you with the care you need, when you need it.  We recommend signing up for the patient portal called "MyChart".  Sign up information is provided on this After Visit Summary.  MyChart is used to connect with patients for Virtual Visits (Telemedicine).  Patients are able to view lab/test results, encounter notes, upcoming appointments, etc.  Non-urgent messages can be sent to your provider as well.   To learn more about what you can do with MyChart, go to [email or web address].    Your next appointment:   3 month(s)  After ECHO, Late May early June  The format for your next appointment:   In Person  Provider:   You may see Izora Ribas, MD or one of the following Advanced Practice Providers on your designated Care Team:    Ronie Spies, PA-C  Jacolyn Reedy, PA-C

## 2020-05-08 ENCOUNTER — Other Ambulatory Visit (INDEPENDENT_AMBULATORY_CARE_PROVIDER_SITE_OTHER): Payer: 59

## 2020-05-08 ENCOUNTER — Other Ambulatory Visit: Payer: Self-pay

## 2020-05-08 DIAGNOSIS — E1165 Type 2 diabetes mellitus with hyperglycemia: Secondary | ICD-10-CM

## 2020-05-08 DIAGNOSIS — E782 Mixed hyperlipidemia: Secondary | ICD-10-CM | POA: Diagnosis not present

## 2020-05-08 DIAGNOSIS — Z794 Long term (current) use of insulin: Secondary | ICD-10-CM | POA: Diagnosis not present

## 2020-05-08 LAB — COMPREHENSIVE METABOLIC PANEL
ALT: 14 U/L (ref 0–53)
AST: 22 U/L (ref 0–37)
Albumin: 4.1 g/dL (ref 3.5–5.2)
Alkaline Phosphatase: 102 U/L (ref 39–117)
BUN: 10 mg/dL (ref 6–23)
CO2: 31 mEq/L (ref 19–32)
Calcium: 9.3 mg/dL (ref 8.4–10.5)
Chloride: 104 mEq/L (ref 96–112)
Creatinine, Ser: 0.83 mg/dL (ref 0.40–1.50)
GFR: 106.19 mL/min (ref >60.00)
Glucose, Bld: 104 mg/dL — ABNORMAL HIGH (ref 70–99)
Potassium: 4.3 mEq/L (ref 3.5–5.1)
Sodium: 139 mEq/L (ref 135–145)
Total Bilirubin: 0.7 mg/dL (ref 0.2–1.2)
Total Protein: 6.8 g/dL (ref 6.0–8.3)

## 2020-05-08 LAB — LIPID PANEL
Cholesterol: 88 mg/dL (ref 0–200)
HDL: 30.7 mg/dL — ABNORMAL LOW (ref >39.00)
LDL Cholesterol: 37 mg/dL (ref 0–99)
NonHDL: 57.56
Total CHOL/HDL Ratio: 3
Triglycerides: 104 mg/dL (ref 0.0–149.0)
VLDL: 20.8 mg/dL (ref 0.0–40.0)

## 2020-05-08 LAB — HEMOGLOBIN A1C: Hgb A1c MFr Bld: 7.3 % — ABNORMAL HIGH (ref 4.6–6.5)

## 2020-05-14 ENCOUNTER — Ambulatory Visit (INDEPENDENT_AMBULATORY_CARE_PROVIDER_SITE_OTHER): Payer: 59 | Admitting: Endocrinology

## 2020-05-14 ENCOUNTER — Other Ambulatory Visit: Payer: Self-pay

## 2020-05-14 ENCOUNTER — Encounter: Payer: Self-pay | Admitting: Endocrinology

## 2020-05-14 VITALS — BP 124/68 | HR 68 | Ht 73.0 in | Wt 270.0 lb

## 2020-05-14 DIAGNOSIS — Z794 Long term (current) use of insulin: Secondary | ICD-10-CM | POA: Diagnosis not present

## 2020-05-14 DIAGNOSIS — E1165 Type 2 diabetes mellitus with hyperglycemia: Secondary | ICD-10-CM | POA: Diagnosis not present

## 2020-05-14 DIAGNOSIS — E782 Mixed hyperlipidemia: Secondary | ICD-10-CM

## 2020-05-14 MED ORDER — DEXCOM G6 SENSOR MISC: 3 refills | Status: DC

## 2020-05-14 MED ORDER — TRULICITY 1.5 MG/0.5ML ~~LOC~~ SOAJ: SUBCUTANEOUS | 1 refills | Status: DC

## 2020-05-14 NOTE — Patient Instructions (Addendum)
Basal 1.15 until 6 am, then 1.5  Check blood sugars on waking up 7 days a week  Also check blood sugars about 2 hours after meals and do this after different meals by rotation  Recommended blood sugar levels on waking up are 90-130 and about 2 hours after meal is 130-170  Please bring your blood sugar monitor to each visit, thank you

## 2020-05-14 NOTE — Progress Notes (Signed)
Patient ID: Todd Gaines, male   DOB: Jun 02, 1975, 45 y.o.   MRN: [id number]           Reason for Appointment: Follow-up for Type 2 Diabetes  Referring physician: [facility]   History of Present Illness:          Date of diagnosis of type 2 diabetes mellitus: 2012       Background history:   He believes her A1c was 12% when he was first diagnosed to have diabetes and was having symptoms of feeling very tired. Initially he made changes in his lifestyle and was able to lose some weight also He was probably treated with metformin and may have been given Tradjenta subsequently Previous records are not available He thinks his A1c had been around 7-7.5% for some time until last year He was started on basal insulin in 9/16 when his A1c was 9.9 He did not use the V-go pump because of lack of adequate insurance coverage and out-of-pocket expense, this was improving his control  Recent history:   INSULIN regimen previously: Glargine insulin 40 units in the evening, Humalog before  dinner usually 10-12 units  OMNIPOD PUMP SETTINGS: Basal rate 1.25 midnight-6 AM and then 1.5 Carbohydrate ratio 1:1, correction 1: 50 and target 120-140, active insulin 4 hours   Non-insulin hypoglycemic drugs: Metformin ER 2000 mg a day, Victoza 1.8 mg daily, Jardiance $RemoveBeforeDE'25mg'[id number]$   The A1c is higher at 7.3 compared to 6.2  Previous fructosamine is excellent at 258 compared to previous readings  Current blood sugar patterns and problems identified:    He did try to use his pump consistently compared to his last visit but because of excessive skin irritation he has not used his pump for 2 weeks  He is only using some Basaglar and no mealtime insulin with not using the pump  Also has not checked his blood sugars lately since his freestyle libre had malfunctioned and he is waiting for replacement sensors  Blood sugar data from early February shows variable control, only 1 complete day of data is available  He  generally appears to have high postprandial readings either after lunch or dinner with limited data  Also has some tendency to low sugars overnight, lowest 56 around 3:30 AM and 12:30 AM  Currently using Victoza but has been switched from [place] to [place] by insurance  He says he is trying to eat healthy meals but he is not able to control his intake of sweet tea or regular Gatorade  His weight is fluctuated and is overall about the same  Exercise: active at work   Side effects from medications have been: Transient nausea from Trulicity  Compliance with the medical regimen: Fair    Glucose monitoring:  As above  Blood Glucose data as above; limited data from freestyle libre average blood sugar 158   PRE-MEAL Fasting Lunch Dinner Bedtime Overall  Glucose range:  123-134   132  148-263   Mean/median:      175   POST-MEAL PC Breakfast PC Lunch PC Dinner  Glucose range:   198   Mean/median:        Self-care:  Typical meal intake: Breakfast is eggs, sausage sometimes, otherwise may have breakfast burrito  Snacks will be peanut butter crackers, chips, pretzels and peanut butter               Dietician visit, most recent: 10/16  Weight history: 275-298 lbs  Wt Readings from Last 3 Encounters:  05/14/20 270 lb (122.5 kg)  04/17/20 278 lb (126.1 kg)  02/13/20 272 lb 9.6 oz (123.7 kg)    Glycemic control:   Lab Results  Component Value Date   HGBA1C 7.3 (H) 05/08/2020   HGBA1C 6.2 (A) 02/13/2020   HGBA1C 6.6 (H) 11/09/2019   Lab Results  Component Value Date   MICROALBUR <0.7 11/09/2019   LDLCALC 37 05/08/2020   CREATININE 0.83 05/08/2020    Lab on 05/08/2020  Component Date Value Ref Range Status  . Cholesterol 05/08/2020 88  0 - 200 mg/dL Final   ATP III Classification       Desirable:  < 200 mg/dL               Borderline High:  200 - 239 mg/dL          High:  > = 240 mg/dL  . Triglycerides 05/08/2020 104.0  0.0 - 149.0 mg/dL Final    Normal:  <150 mg/dLBorderline High:  150 - 199 mg/dL  . HDL 05/08/2020 30.70* >39.00 mg/dL Final  . VLDL 05/08/2020 20.8  0.0 - 40.0 mg/dL Final  . LDL Cholesterol 05/08/2020 37  0 - 99 mg/dL Final  . Total CHOL/HDL Ratio 05/08/2020 3   Final                  Men          Women1/2 Average Risk     3.4          3.3Average Risk          5.0          4.42X Average Risk          9.6          7.13X Average Risk          15.0          11.0                      . NonHDL 05/08/2020 57.56   Final   NOTE:  Non-HDL goal should be 30 mg/dL higher than patient's LDL goal (i.e. LDL goal of < 70 mg/dL, would have non-HDL goal of < 100 mg/dL)  . Sodium 05/08/2020 139  135 - 145 mEq/L Final  . Potassium 05/08/2020 4.3  3.5 - 5.1 mEq/L Final  . Chloride 05/08/2020 104  96 - 112 mEq/L Final  . CO2 05/08/2020 31  19 - 32 mEq/L Final  . Glucose, Bld 05/08/2020 104* 70 - 99 mg/dL Final  . BUN 05/08/2020 10  6 - 23 mg/dL Final  . Creatinine, Ser 05/08/2020 0.83  0.40 - 1.50 mg/dL Final  . Total Bilirubin 05/08/2020 0.7  0.2 - 1.2 mg/dL Final  . Alkaline Phosphatase 05/08/2020 102  39 - 117 U/L Final  . AST 05/08/2020 22  0 - 37 U/L Final  . ALT 05/08/2020 14  0 - 53 U/L Final  . Total Protein 05/08/2020 6.8  6.0 - 8.3 g/dL Final  . Albumin 05/08/2020 4.1  3.5 - 5.2 g/dL Final  . GFR 05/08/2020 106.19  >60.00 mL/min Final   Calculated using the CKD-EPI Creatinine Equation (2021)  . Calcium 05/08/2020 9.3  8.4 - 10.5 mg/dL Final  . Hgb A1c MFr Bld 05/08/2020 7.3* 4.6 - 6.5 % Final   Glycemic Control Guidelines for People with Diabetes:Non Diabetic:  <6%Goal of Therapy: <7%Additional Action Suggested:  >  8%       Allergies as of 05/14/2020   No Known Allergies     Medication List       Accurate as of May 14, 2020  4:24 PM. If you have any questions, ask your nurse or doctor.        aspirin 81 MG EC tablet Take 1 tablet (81 mg total) by mouth daily. Swallow whole.   atorvastatin 80 MG tablet Commonly  known as: LIPITOR Take 80 mg by mouth daily.   Contour Next One Kit 1 each by Does not apply route in the morning, at noon, in the evening, and at bedtime. Use Bayer Contour Next One meter to check blood sugar 4 times daily.   empagliflozin 25 MG Tabs tablet Commonly known as: Jardiance Take 1 tablet (25 mg total) by mouth daily before breakfast. Start when [employer] finished   [employer] 2 [place] 14 DAYS What changed: Another medication with the same name was added. Make sure you understand how and when to take each. Changed by: Elayne Snare, MD   Dexcom G6 Sensor Misc Use to monitor blood sugar, change after 10 days What changed: You were already taking a medication with the same name, and this prescription was added. Make sure you understand how and when to take each. Changed by: Elayne Snare, MD   glucose blood test strip Use OneTouch Ultra test strips as instructed to check blood sugar 4 times daily. DX:E11.65   insulin lispro 100 UNIT/ML injection Commonly known as: HumaLOG Use max of 55 units daily via insulin pump.   metFORMIN 500 MG 24 hr tablet Commonly known as: GLUCOPHAGE-XR TAKE FOUR TABLETS BY MOUTH DAILY   metoprolol tartrate 25 MG tablet Commonly known as: LOPRESSOR Take 25 mg by mouth 2 (two) times daily.   nitroGLYCERIN 0.4 MG SL tablet Commonly known as: NITROSTAT Place 1 tablet (0.4 mg total) under the tongue every 5 (five) minutes as needed for chest pain.   NovoTwist 32G X 5 MM Misc Generic drug: Insulin Pen Needle Use one per day to inject victoza   OmniPod Dash 5 Pack Pods Misc Apply one pod to body once every 3 days for insulin delivery.   prasugrel 10 MG Tabs tablet Commonly known as: EFFIENT Take 1 tablet (10 mg total) by mouth daily. Last day 09/08/20.   Stelara 90 MG/ML Sosy injection Generic drug: ustekinumab Inject 90 mg into the skin once. Every 3 months.   Victoza 18 MG/3ML Sopn Generic drug:  liraglutide 1.8 mg qd       Allergies: No Known Allergies  Past Medical History:  Diagnosis Date  . CAD (coronary artery disease)   . Diabetes mellitus without complication ([place])   . Elevated cholesterol   . Psoriasis     Past Surgical History:  Procedure Laterality Date  . CORONARY STENT INTERVENTION N/A 09/08/2019   Procedure: CORONARY STENT INTERVENTION;  Surgeon: Nelva Bush, MD;  Location: [place] [facility];  Service: Cardiovascular;  Laterality: N/A;  . LEFT HEART CATH AND CORONARY ANGIOGRAPHY N/A 09/08/2019   Procedure: LEFT HEART CATH AND CORONARY ANGIOGRAPHY;  Surgeon: Nelva Bush, MD;  Location: [place] [facility];  Service: Cardiovascular;  Laterality: N/A;    Family History  Problem Relation Age of Onset  . Diabetes Mother   . Diabetes Paternal Grandfather   . Hypertension Paternal Grandfather   . Heart disease Neg Hx   . Cancer Neg Hx     Social  History:  reports that he quit smoking about 18 years ago. His smoking use included cigarettes. He has a 2.50 pack-year smoking history. He has never used smokeless tobacco. He reports that he does not drink alcohol and does not use drugs.    Review of Systems    Lipid history: On treatment with Lipitor 80 mg since his diagnosis of CAD  Has persistently low HDL Triglycerides are improved   Lab Results  Component Value Date   CHOL 88 05/08/2020   HDL 30.70 (L) 05/08/2020   LDLCALC 37 05/08/2020   LDLDIRECT 90.0 06/27/2019   TRIG 104.0 05/08/2020   CHOLHDL 3 05/08/2020             Blood pressure normal without medications  He is on Jardiance now  BP Readings from Last 3 Encounters:  05/14/20 124/68  04/17/20 108/72  02/13/20 124/82    Lab Results  Component Value Date   CREATININE 0.83 05/08/2020   CREATININE 0.92 11/09/2019   CREATININE 0.91 09/09/2019       Physical Examination:  BP 124/68   Pulse 68   Ht $R'6\' 1"'Se$  (1.854 m)   Wt 270 lb (122.5 kg)   SpO2 96%   BMI 35.62  kg/m        ASSESSMENT:  Diabetes type 2, with obesity  See history of present illness for detailed discussion of his current management, blood sugar patterns and problems identified  His A1c is higher at 7.2  He is using the Omni pod insulin pump, Victoza, Metformin and Invokana  His insulin pump has not been used and not clear if his control is adequate with this Most of his high blood sugars are related to variable diet and drinking sweet drinks He is only going to start back on his pump today and he thinks he can try using the pump without the skintac to reduce the insulin pod sticking to the skin excessively  Limited data from his freestyle libre last month indicates some tendency to low sugars overnight occasionally  Discuss his day-to-day management, basal and bolus insulin, blood sugar targets  Lipids: Well-controlled with 80 mg Lipitor, also taking this for cardiovascular risk reduction  PLAN:    Restart insulin pump today Basal rate 1.15 instead of 1.25 from 12 AM-6 AM Bolus with every meal Start using Dexcom sensor with his phone app since he is not able to use the freestyle libre currently and discussed the differences with the freestyle libre and how to apply it Discussed postprandial targets were also fasting targets, to call if he is having excessive high and low sugars Cut back on sweet drinks Try to bolus consistently before starting to eat and adjust boluses based on meal size and carbohydrates Switch Victoza to TRULICITY 1.5 mg weekly since this appears to be better covered this year  Total visit time including counseling =30 minutes.  Patient Instructions  Basal 1.15 until 6 am, then 1.5  Check blood sugars on waking up 7 days a week  Also check blood sugars about 2 hours after meals and do this after different meals by rotation  Recommended blood sugar levels on waking up are 90-130 and about 2 hours after meal is 130-170  Please bring your blood  sugar monitor to each visit, thank you        Elayne Snare 05/14/2020, 4:24 PM   Note: This office note was prepared with Dragon voice recognition system technology. Any transcriptional errors that result from this process are  unintentional.

## 2020-05-17 ENCOUNTER — Other Ambulatory Visit: Payer: Self-pay

## 2020-05-17 MED ORDER — DEXCOM G6 SENSOR MISC: 3 refills | Status: DC

## 2020-05-17 NOTE — Progress Notes (Signed)
RX change pharmacies per patient's request.

## 2020-05-29 ENCOUNTER — Other Ambulatory Visit: Payer: Self-pay | Admitting: Endocrinology

## 2020-06-21 ENCOUNTER — Telehealth: Payer: Self-pay | Admitting: *Deleted

## 2020-06-21 NOTE — Telephone Encounter (Signed)
PA approved 03/17/2019-06/13/21 for Victoza inj. 18mg /78ml.

## 2020-07-05 ENCOUNTER — Other Ambulatory Visit: Payer: Self-pay | Admitting: Endocrinology

## 2020-07-22 ENCOUNTER — Other Ambulatory Visit: Payer: Self-pay | Admitting: Endocrinology

## 2020-07-29 ENCOUNTER — Other Ambulatory Visit: Payer: Self-pay

## 2020-07-29 ENCOUNTER — Ambulatory Visit (HOSPITAL_COMMUNITY): Payer: 59 | Attending: Cardiology

## 2020-07-29 DIAGNOSIS — I712 Thoracic aortic aneurysm, without rupture, unspecified: Secondary | ICD-10-CM

## 2020-07-29 DIAGNOSIS — I2510 Atherosclerotic heart disease of native coronary artery without angina pectoris: Secondary | ICD-10-CM | POA: Diagnosis not present

## 2020-07-29 LAB — ECHOCARDIOGRAM COMPLETE
Area-P 1/2: 3.12 cm2
S' Lateral: 3.3 cm

## 2020-07-31 ENCOUNTER — Other Ambulatory Visit: Payer: Self-pay | Admitting: *Deleted

## 2020-07-31 MED ORDER — DEXCOM G6 TRANSMITTER MISC: 3 refills | Status: DC

## 2020-08-12 ENCOUNTER — Other Ambulatory Visit: Payer: Self-pay | Admitting: Endocrinology

## 2020-08-12 DIAGNOSIS — E1165 Type 2 diabetes mellitus with hyperglycemia: Secondary | ICD-10-CM

## 2020-08-12 DIAGNOSIS — Z794 Long term (current) use of insulin: Secondary | ICD-10-CM

## 2020-08-14 ENCOUNTER — Other Ambulatory Visit: Payer: Self-pay

## 2020-08-14 ENCOUNTER — Other Ambulatory Visit (INDEPENDENT_AMBULATORY_CARE_PROVIDER_SITE_OTHER): Payer: 59

## 2020-08-14 DIAGNOSIS — E1165 Type 2 diabetes mellitus with hyperglycemia: Secondary | ICD-10-CM

## 2020-08-14 DIAGNOSIS — Z794 Long term (current) use of insulin: Secondary | ICD-10-CM

## 2020-08-14 LAB — BASIC METABOLIC PANEL
BUN: 14 mg/dL (ref 6–23)
CO2: 27 mEq/L (ref 19–32)
Calcium: 9.4 mg/dL (ref 8.4–10.5)
Chloride: 104 mEq/L (ref 96–112)
Creatinine, Ser: 0.9 mg/dL (ref 0.40–1.50)
GFR: 103.43 mL/min (ref >60.00)
Glucose, Bld: 122 mg/dL — ABNORMAL HIGH (ref 70–99)
Potassium: 4.5 mEq/L (ref 3.5–5.1)
Sodium: 140 mEq/L (ref 135–145)

## 2020-08-14 LAB — HEMOGLOBIN A1C: Hgb A1c MFr Bld: 7.1 % — ABNORMAL HIGH (ref 4.6–6.5)

## 2020-08-15 NOTE — Progress Notes (Signed)
Cardiology Office Note:    Date:  08/16/2020   ID:  Todd Gaines, DOB 1975/12/18, MRN [id number]  Pronounced; Per' mar  PCP:  Abner Greenspan, MD  [facility] Cardiologist:  Werner Lean, MD  [place] Electrophysiologist:  None   CC:  Follow up CAD  History of Present Illness:    Todd Gaines is a 45 y.o. male with a hx of CAD (NSTEMI 6/21), DM with HTN, DM with HLD, Mild thoracic aortic aneurysm who presents for evaluation 04/17/20 (transition from Dr. Saunders Revel to [facility] location).  Had been seen in follow up 12/25/2019 with resolution of chest pain.  Initial angina equivalent is chest heaviness. In interim of this visit, patient's arm has improved.  Surgery is scheduled for 09/23/20.  Patient notes that he is doing well.  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   There are no interval hospital/ED visit.    No chest pain or pressure .  No SOB/DOE and no PND/Orthopnea.  No weight gain or leg swelling.  No palpitations or syncope.   Past Medical History:  Diagnosis Date  . CAD (coronary artery disease)   . Diabetes mellitus without complication ([place])   . Elevated cholesterol   . Psoriasis     Past Surgical History:  Procedure Laterality Date  . CORONARY STENT INTERVENTION N/A 09/08/2019   Procedure: CORONARY STENT INTERVENTION;  Surgeon: Nelva Bush, MD;  Location: [place] [facility];  Service: Cardiovascular;  Laterality: N/A;  . LEFT HEART CATH AND CORONARY ANGIOGRAPHY N/A 09/08/2019   Procedure: LEFT HEART CATH AND CORONARY ANGIOGRAPHY;  Surgeon: Nelva Bush, MD;  Location: [place] [facility];  Service: Cardiovascular;  Laterality: N/A;    Current Medications: Current Meds  Medication Sig  . aspirin EC 81 MG EC tablet Take 1 tablet (81 mg total) by mouth daily. Swallow whole.  Marland Kitchen atorvastatin (LIPITOR) 80 MG tablet Take 80 mg by mouth daily.  . Blood Glucose Monitoring Suppl (CONTOUR NEXT ONE) KIT 1 each by Does not apply route  in the morning, at noon, in the evening, and at bedtime. Use Bayer Contour Next One meter to check blood sugar 4 times daily.  . Continuous Blood Gluc Sensor (DEXCOM G6 SENSOR) MISC Use to monitor blood sugar, change after 10 days  . Continuous Blood Gluc Sensor (FREESTYLE LIBRE 2 SENSOR) MISC CHANGE SENSOR EVERY 14 DAYS  . Continuous Blood Gluc Transmit (DEXCOM G6 TRANSMITTER) MISC Use one transmitter every 90 days  . Dulaglutide (TRULICITY) 1.5 MV/6.7MC SOPN INJECT 1.5 MG UNDER THE SKIN ONCE WEEKLY **START THIS THE DAY AFTER FINISHING VICTOZA**  . glucose blood test strip Use OneTouch Ultra test strips as instructed to check blood sugar 4 times daily. DX:E11.65  . Insulin Disposable Pump (OMNIPOD DASH 5 PACK PODS) MISC Apply one pod to body once every 3 days for insulin delivery.  . insulin lispro (HUMALOG) 100 UNIT/ML injection Use max of 55 units daily via insulin pump.  . Insulin Pen Needle (NOVOTWIST) 32G X 5 MM MISC Use one per day to inject victoza  . JARDIANCE 25 MG TABS tablet TAKE ONE TABLET BY MOUTH DAILY BEFORE BREAKFAST. START WHEN FINISHED ON INVOKANA  . metFORMIN (GLUCOPHAGE-XR) 500 MG 24 hr tablet TAKE FOUR TABLETS BY MOUTH DAILY  . metoprolol tartrate (LOPRESSOR) 25 MG tablet Take 25 mg by mouth 2 (two) times daily.  . nitroGLYCERIN (NITROSTAT) 0.4 MG SL tablet Place 1 tablet (0.4 mg total) under the tongue every 5 (  five) minutes as needed for chest pain.  Delsa Grana 90 MG/ML SOSY injection Inject 90 mg into the skin once. Every 3 months.  . [DISCONTINUED] prasugrel (EFFIENT) 10 MG TABS tablet Take 1 tablet (10 mg total) by mouth daily. Last day 09/08/20.     Allergies:   Patient has no known allergies.   Social History   Socioeconomic History  . Marital status: Married    Spouse name: Not on file  . Number of children: Not on file  . Years of education: Not on file  . Highest education level: Not on file  Occupational History  . Not on file  Tobacco Use  . Smoking  status: Former Smoker    Packs/day: 0.25    Years: 10.00    Pack years: 2.50    Types: Cigarettes    Quit date: 04/17/2002    Years since quitting: 18.3  . Smokeless tobacco: Never Used  Vaping Use  . Vaping Use: Never used  Substance and Sexual Activity  . Alcohol use: No    Alcohol/week: 0.0 standard drinks  . Drug use: No  . Sexual activity: Yes  Other Topics Concern  . Not on file  Social History Narrative  . 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 There is no history of Heart disease or Cancer.  History of coronary artery disease notable for no members. History of heart failure notable for no members. No history of cardiomyopathies including hypertrophic cardiomyopathy, left ventricular non-compaction, or arrhythmogenic right ventricular cardiomyopathy. History of arrhythmia notable for no members. Grandfather died of sudden cardiac death at the age of 45 No history of bicuspid aortic valve or aortic aneurysm or dissection.  ROS:   Please see the history of present illness.     All other systems reviewed and are negative.  EKGs/Labs/Other Studies Reviewed:    The following studies were reviewed today:  EKG:   01/05/20: SR 66 inferior TWI  Transthoracic Echocardiogram: Date: 04/17/20 Results: AHI 2.16 cm/m normal EF 1. Left ventricular ejection fraction, by estimation, is 55 to 60%. The  left ventricle has normal function. The left ventricle demonstrates  regional wall motion abnormalities (see scoring diagram/findings for  description). There is mild left  ventricular  hypertrophy. Left ventricular diastolic parameters were normal. There is  severe hypokinesis of the left ventricular, basal-mid inferior wall and  inferoseptal wall.  2. Right ventricular systolic function is normal. The right ventricular  size is normal. Tricuspid regurgitation signal is inadequate for assessing  PA pressure.  3. The mitral valve is grossly normal. No evidence of mitral valve  regurgitation. No evidence of mitral stenosis.  4. The aortic valve has an indeterminant number of cusps. Aortic valve  regurgitation is not visualized. No aortic stenosis is present.  5. Aortic dilatation noted. There is mild dilatation of the aortic root  measuring 40 mm.  6. The inferior vena cava is normal in size with <50% respiratory  variability, suggesting right atrial pressure of 8 mmHg.   Left/Right Heart Catheterizations: Date: 09/08/19 Results: Conclusions: 1. Two-vessel coronary artery disease  with 60% distal LCx stenosis and thrombotic occlusion of the distal RCA. 2. Mildly elevated left ventricular filling pressure (LVEDP ~20 mmHg). 3. Successful PCI to distal RCA using resolute Onyx 3.0 x 38 mm drug-eluting stent with 0% residual stenosis and TIMI-3 flow.  Recommendations: 1. Dual antiplatelet therapy with aspirin and prasugrel for 12 months. 2. Aggressive secondary prevention. 3. Favor medical therapy of moderate distal LCx disease.  If Mr. Furio has recurrent chest pain, functional assessment of this lesion and PCI, if hemodynamically significant, will need to be considered.    Recent Labs: 09/09/2019: Magnesium 2.1 09/21/2019: Hemoglobin 15.1; Platelets 274 05/08/2020: ALT 14 08/14/2020: BUN 14; Creatinine, Ser 0.90; Potassium 4.5; Sodium 140  Recent Lipid Panel    Component Value Date/Time   CHOL 88 05/08/2020 0746   TRIG 104.0 05/08/2020 0746   HDL 30.70 (L) 05/08/2020 0746   CHOLHDL 3 05/08/2020 0746   VLDL 20.8 05/08/2020 0746   LDLCALC 37  05/08/2020 0746   LDLDIRECT 90.0 06/27/2019 1507     Risk Assessment/Calculations:     N/A  Physical Exam:    VS:  BP 112/70   Pulse 92   Ht _0  (1.854 m)   Wt 117.5 kg   SpO2 97%   BMI 34.17 kg/m     Wt Readings from Last 3 Encounters:  08/16/20 117.5 kg  05/14/20 122.5 kg  04/17/20 126.1 kg    GEN: Obese well developed in no acute distress HEENT: Normal NECK: No JVD LYMPHATICS: No lymphadenopathy CARDIAC: RRR, no murmurs, rubs, gallops RESPIRATORY:  Clear to auscultation without rales, wheezing or rhonchi  ABDOMEN: Soft, non-tender, non-distended MUSCULOSKELETAL:  No edema; No deformity  SKIN: Warm and dry NEUROLOGIC:  Alert and oriented x 3 PSYCHIATRIC:  Normal affect   ASSESSMENT:    1. Coronary artery disease involving native coronary artery of native heart without angina pectoris   2. Thoracic aortic aneurysm without rupture ([place])   3. Mixed hyperlipidemia   4. Preop cardiovascular exam   5. Hypertension associated with diabetes ([place])    PLAN:    Coronary Artery Disease; Obstructive HLD HTN and DM Mild Asymptomatic Thoracic Aortic Aneurysm - asymptomatic  - anatomy:  60% distal LCx stenosis  - continue ASA 81 mg- DC effient today - continue statin, goal LDL < 70 - continue BB - continue PRN nitrates - continue ACEi - Has intentionally lost weight sincelast visit; discussed diet and exercise - patient is on Jardiance - Has RCRI of 1 and is low risk for perioperative MI; and 4+ fMEts; reasonable for surgery without other - Given contrast shortage crisis; CT Aorta planned for December 2022 - 1st degree relative one time screening briefly discussed given grandfather hx; in prior episodes - Will defer not using Fluoroquinolones discussion until post CCTA  December 2022 follow up unless new symptoms or abnormal test results warranting change in plan    Medication Adjustments/Labs and Tests Ordered: Current medicines are reviewed at length with  the patient today.  Concerns regarding medicines are outlined above.  Orders Placed This Encounter  Procedures  . CT ANGIO CHEST AORTA W/CM & OR WO/CM  . Basic metabolic panel   No orders of the defined types were placed in this encounter.   Patient Instructions  Medication Instructions:  Your physician has recommended you make the following change in your medication:  STOP: prasugrel (Effient) *If you need a refill on your cardiac medications before your next appointment, please call your pharmacy*   Lab  Work: 1 week prior to CT Aorta If you have labs (blood work) drawn today and your tests are completely normal, you will receive your results only by: Marland Kitchen MyChart Message (if you have MyChart) OR . A paper copy in the mail If you have any lab test that is abnormal or we need to change your treatment, we will call you to review the results.   Testing/Procedures: Your physician has requested that you have a CT Aorta in December 2022.   Follow-Up: At [facility], you and your health needs are our priority.  As part of our continuing mission to provide you with exceptional heart care, we have created designated Provider Care Teams.  These Care Teams include your primary Cardiologist (physician) and Advanced Practice Providers (APPs -  Physician Assistants and Nurse Practitioners) who all work together to provide you with the care you need, when you need it.  We recommend signing up for the patient portal called "MyChart".  Sign up information is provided on this After Visit Summary.  MyChart is used to connect with patients for Virtual Visits (Telemedicine).  Patients are able to view lab/test results, encounter notes, upcoming appointments, etc.  Non-urgent messages can be sent to your provider as well.   To learn more about what you can do with MyChart, go to [email or web address].    Your next appointment:   6 month(s)  The format for your next appointment:   In  Person  Provider:   You may see Rudean Haskell, MD or one of the following Advanced Practice Providers on your designated Care Team:    Melina Copa, PA-C  Ermalinda Barrios, PA-C         Signed, Werner Lean, MD  08/16/2020 8:51 AM    [place]

## 2020-08-16 ENCOUNTER — Ambulatory Visit: Payer: 59 | Admitting: Internal Medicine

## 2020-08-16 ENCOUNTER — Other Ambulatory Visit: Payer: Self-pay

## 2020-08-16 ENCOUNTER — Encounter: Payer: Self-pay | Admitting: Internal Medicine

## 2020-08-16 VITALS — BP 112/70 | HR 92 | Ht 73.0 in | Wt 259.0 lb

## 2020-08-16 DIAGNOSIS — I712 Thoracic aortic aneurysm, without rupture, unspecified: Secondary | ICD-10-CM

## 2020-08-16 DIAGNOSIS — Z01810 Encounter for preprocedural cardiovascular examination: Secondary | ICD-10-CM

## 2020-08-16 DIAGNOSIS — I152 Hypertension secondary to endocrine disorders: Secondary | ICD-10-CM

## 2020-08-16 DIAGNOSIS — E782 Mixed hyperlipidemia: Secondary | ICD-10-CM | POA: Diagnosis not present

## 2020-08-16 DIAGNOSIS — E1159 Type 2 diabetes mellitus with other circulatory complications: Secondary | ICD-10-CM

## 2020-08-16 DIAGNOSIS — I2510 Atherosclerotic heart disease of native coronary artery without angina pectoris: Secondary | ICD-10-CM

## 2020-08-16 DIAGNOSIS — E1169 Type 2 diabetes mellitus with other specified complication: Secondary | ICD-10-CM

## 2020-08-16 DIAGNOSIS — E785 Hyperlipidemia, unspecified: Secondary | ICD-10-CM

## 2020-08-16 NOTE — Patient Instructions (Signed)
Medication Instructions:  Your physician has recommended you make the following change in your medication:  STOP: prasugrel (Effient) *If you need a refill on your cardiac medications before your next appointment, please call your pharmacy*   Lab Work: 1 week prior to CT Aorta If you have labs (blood work) drawn today and your tests are completely normal, you will receive your results only by: Marland Kitchen MyChart Message (if you have MyChart) OR . A paper copy in the mail If you have any lab test that is abnormal or we need to change your treatment, we will call you to review the results.   Testing/Procedures: Your physician has requested that you have a CT Aorta in December 2022.   Follow-Up: At [facility], you and your health needs are our priority.  As part of our continuing mission to provide you with exceptional heart care, we have created designated Provider Care Teams.  These Care Teams include your primary Cardiologist (physician) and Advanced Practice Providers (APPs -  Physician Assistants and Nurse Practitioners) who all work together to provide you with the care you need, when you need it.  We recommend signing up for the patient portal called "MyChart".  Sign up information is provided on this After Visit Summary.  MyChart is used to connect with patients for Virtual Visits (Telemedicine).  Patients are able to view lab/test results, encounter notes, upcoming appointments, etc.  Non-urgent messages can be sent to your provider as well.   To learn more about what you can do with MyChart, go to [email or web address].    Your next appointment:   6 month(s)  The format for your next appointment:   In Person  Provider:   You may see Riley Lam, MD or one of the following Advanced Practice Providers on your designated Care Team:    Ronie Spies, PA-C  Jacolyn Reedy, PA-C

## 2020-08-19 ENCOUNTER — Ambulatory Visit: Payer: 59 | Admitting: Endocrinology

## 2020-08-26 ENCOUNTER — Encounter: Payer: Self-pay | Admitting: Endocrinology

## 2020-08-26 ENCOUNTER — Ambulatory Visit (INDEPENDENT_AMBULATORY_CARE_PROVIDER_SITE_OTHER): Payer: 59 | Admitting: Endocrinology

## 2020-08-26 ENCOUNTER — Other Ambulatory Visit: Payer: Self-pay

## 2020-08-26 VITALS — BP 138/85 | HR 94 | Ht 73.0 in | Wt 265.6 lb

## 2020-08-26 DIAGNOSIS — R030 Elevated blood-pressure reading, without diagnosis of hypertension: Secondary | ICD-10-CM

## 2020-08-26 DIAGNOSIS — Z794 Long term (current) use of insulin: Secondary | ICD-10-CM | POA: Diagnosis not present

## 2020-08-26 DIAGNOSIS — E1165 Type 2 diabetes mellitus with hyperglycemia: Secondary | ICD-10-CM

## 2020-08-26 DIAGNOSIS — Z6835 Body mass index (BMI) 35.0-35.9, adult: Secondary | ICD-10-CM

## 2020-08-26 LAB — POCT GLUCOSE (DEVICE FOR HOME USE): Glucose Fasting, POC: 143 mg/dL — AB (ref 70–99)

## 2020-08-26 NOTE — Patient Instructions (Signed)
Trulicity 3mg  weekly

## 2020-08-26 NOTE — Progress Notes (Signed)
Patient ID: Todd Gaines, male   DOB: 10-07-1975, 45 y.o.   MRN: [id number]           Reason for Appointment: Follow-up for Type 2 Diabetes  Referring physician: [facility]   History of Present Illness:          Date of diagnosis of type 2 diabetes mellitus: 2012       Background history:   He believes her A1c was 12% when he was first diagnosed to have diabetes and was having symptoms of feeling very tired. Initially he made changes in his lifestyle and was able to lose some weight also He was probably treated with metformin and may have been given Tradjenta subsequently Previous records are not available He thinks his A1c had been around 7-7.5% for some time until last year He was started on basal insulin in 9/16 when his A1c was 9.9 He did not use the V-go pump because of lack of adequate insurance coverage and out-of-pocket expense, this was improving his control  Recent history:   INSULIN regimen previously: Glargine insulin 40 units in the evening, Humalog before  dinner usually 10-12 units OMNIPOD PUMP SETTINGS: Basal rate 1.25 midnight-6 AM and then 1.5 Carbohydrate ratio 1:1, correction 1: 50 and target 120-140, active insulin 4 hours   Non-insulin hypoglycemic drugs: Metformin ER 4627 mg a day, Trulicity 1.5 weekly, Jardiance $RemoveBeforeDEI'25mg'[id number]$   The A1c is  7.1, previously 7.3  Current blood sugar patterns and problems identified:   He had stopped using his pump in 2/22 Subsequently he says he wanted to intensify diet and exercise regimen and reduce his insulin He previously was only using Basaglar and had improved his diet However now he has started exercising with walking or at the gym at least 3 days a week in addition to his work activity The Victoza was changed to Trulicity 1.5 mg weekly for cost reasons  He continues to take [place] He says he is trying to eat healthy meals and is finally able to cut back on sweet tea and regular soft drinks although still having these  occasionally His weight is significantly better and down 11 pounds as of earlier this month Monitoring: He says that he is using the Dexcom but he has not installed the clarity app on his phone and unable to review his data His blood sugar may be only higher if he is eating fast food and drinking regular soft drink   Side effects from medications have been: Transient nausea from Trulicity  Compliance with the medical regimen: Fair    Glucose monitoring:  As above  Blood Glucose data as above; limited data from Dexcom:  Postprandial mostly up to 170 and only once 238 Fasting readings usually <120   Self-care:  Typical meal intake: Breakfast is eggs, sausage sometimes, otherwise may have breakfast burrito  Snacks will be peanut butter crackers, chips, pretzels and peanut butter               Dietician visit, most recent: 10/16                Weight history: 275-298 lbs  Wt Readings from Last 3 Encounters:  08/26/20 265 lb 9.6 oz (120.5 kg)  08/16/20 259 lb (117.5 kg)  05/14/20 270 lb (122.5 kg)    Glycemic control:   Lab Results  Component Value Date   HGBA1C 7.1 (H) 08/14/2020   HGBA1C 7.3 (H) 05/08/2020   HGBA1C 6.2 (A) 02/13/2020   Lab Results  Component Value Date   MICROALBUR <0.7 11/09/2019   LDLCALC 37 05/08/2020   CREATININE 0.90 08/14/2020    Office Visit on 08/26/2020  Component Date Value Ref Range Status   Glucose Fasting, POC 08/26/2020 143 (A) 70 - 99 mg/dL Final      Allergies as of 08/26/2020   No Known Allergies      Medication List        Accurate as of August 26, 2020  4:25 PM. If you have any questions, ask your nurse or doctor.          aspirin 81 MG EC tablet Take 1 tablet (81 mg total) by mouth daily. Swallow whole.   atorvastatin 80 MG tablet Commonly known as: LIPITOR Take 80 mg by mouth daily.   Contour Next One Kit 1 each by Does not apply route in the morning, at noon, in the evening, and at bedtime. Use Bayer  Contour Next One meter to check blood sugar 4 times daily.   Dexcom G6 Sensor Misc Use to monitor blood sugar, change after 10 days What changed: Another medication with the same name was removed. Continue taking this medication, and follow the directions you see here. Changed by: Reather Littler, MD   Dexcom G6 Transmitter Misc Use one transmitter every 90 days   glucose blood test strip Use OneTouch Ultra test strips as instructed to check blood sugar 4 times daily. DX:E11.65   insulin lispro 100 UNIT/ML injection Commonly known as: HumaLOG Use max of 55 units daily via insulin pump.   Jardiance 25 MG Tabs tablet Generic drug: empagliflozin TAKE ONE TABLET BY MOUTH DAILY BEFORE BREAKFAST. START WHEN FINISHED ON INVOKANA   metFORMIN 500 MG 24 hr tablet Commonly known as: GLUCOPHAGE-XR TAKE FOUR TABLETS BY MOUTH DAILY   metoprolol tartrate 25 MG tablet Commonly known as: LOPRESSOR Take 25 mg by mouth 2 (two) times daily.   nitroGLYCERIN 0.4 MG SL tablet Commonly known as: NITROSTAT Place 1 tablet (0.4 mg total) under the tongue every 5 (five) minutes as needed for chest pain.   NovoTwist 32G X 5 MM Misc Generic drug: Insulin Pen Needle Use one per day to inject victoza   Omnipod DASH Pods (Gen 4) Misc Apply one pod to body once every 3 days for insulin delivery.   Stelara 90 MG/ML Sosy injection Generic drug: ustekinumab Inject 90 mg into the skin once. Every 3 months.   Trulicity 1.5 MG/0.5ML Sopn Generic drug: Dulaglutide INJECT 1.5 MG UNDER THE SKIN ONCE WEEKLY **START THIS THE DAY AFTER FINISHING VICTOZA**        Allergies: No Known Allergies  Past Medical History:  Diagnosis Date   CAD (coronary artery disease)    Diabetes mellitus without complication (HCC)    Elevated cholesterol    Psoriasis     Past Surgical History:  Procedure Laterality Date   CORONARY STENT INTERVENTION N/A 09/08/2019   Procedure: CORONARY STENT INTERVENTION;  Surgeon: Yvonne Kendall, MD;  Location: [facility] INVASIVE CV LAB;  Service: Cardiovascular;  Laterality: N/A;   LEFT HEART CATH AND CORONARY ANGIOGRAPHY N/A 09/08/2019   Procedure: LEFT HEART CATH AND CORONARY ANGIOGRAPHY;  Surgeon: Yvonne Kendall, MD;  Location: [facility] INVASIVE CV LAB;  Service: Cardiovascular;  Laterality: N/A;    Family History  Problem Relation Age of Onset   Diabetes Mother    Diabetes Paternal Grandfather    Hypertension Paternal Grandfather    Heart disease Neg Hx    Cancer Neg Hx  Social History:  reports that he quit smoking about 18 years ago. His smoking use included cigarettes. He has a 2.50 pack-year smoking history. He has never used smokeless tobacco. He reports that he does not drink alcohol and does not use drugs.    Review of Systems    Lipid history: On treatment with Lipitor 80 mg from his cardiologist since his diagnosis of CAD  Has persistently low HDL Triglycerides improved   Lab Results  Component Value Date   CHOL 88 05/08/2020   HDL 30.70 (L) 05/08/2020   LDLCALC 37 05/08/2020   LDLDIRECT 90.0 06/27/2019   TRIG 104.0 05/08/2020   CHOLHDL 3 05/08/2020             Blood pressure normal without medications, slightly higher today  He is on Jardiance   BP Readings from Last 3 Encounters:  08/26/20 138/85  08/16/20 112/70  05/14/20 124/68    Lab Results  Component Value Date   CREATININE 0.90 08/14/2020   CREATININE 0.83 05/08/2020   CREATININE 0.92 11/09/2019       Physical Examination:  BP 138/85 (BP Location: Left Arm, Patient Position: Sitting, Cuff Size: Normal)   Pulse 94   Ht $R'6\' 1"'MK$  (1.854 m)   Wt 265 lb 9.6 oz (120.5 kg)   SpO2 96%   BMI 35.04 kg/m        ASSESSMENT:  Diabetes type 2, with obesity  See history of present illness for detailed discussion of his current management, blood sugar patterns and problems identified  His A1c is slightly better at 7.1  He is using Trulicity 1.5 mg , Metformin and  Jardiance  Surprisingly even with not taking any insulin his blood sugars are not significantly high except occasionally postprandially higher when he goes off his diet or drinks regular drinks He has finally been significantly motivated to watch his diet and exercise Has lost weight although this is fluctuating Today data from his Dexcom could not be reviewed but he feels that his blood sugars are generally well controlled, average is reading not available  Blood pressure is relatively high but usually consistent and no history of hypertension or microalbuminuria  PLAN:    Discussed that since he can lose more weight and have more consistent postprandial control the should try the 3 mg Trulicity Unlikely that he needs insulin at this point if he continues to be consistent with meal planning, exercise and continues to lose some weight Discussed fasting and postprandial targets He will continue his Jardiance He will download the clarity app and discussed how to generate a share code for his next visit To call if he is having any consistently high reading He will try to continue cutting back on regular ice tea, soft drinks and fast food  Monitor blood pressure and also recheck microalbumin on next visit    There are no Patient Instructions on file for this visit.     Elayne Snare 08/26/2020, 4:25 PM   Note: This office note was prepared with Dragon voice recognition system technology. Any transcriptional errors that result from this process are unintentional.

## 2020-09-11 ENCOUNTER — Other Ambulatory Visit: Payer: Self-pay | Admitting: Endocrinology

## 2020-09-11 MED ORDER — TRULICITY 3 MG/0.5ML ~~LOC~~ SOAJ: 3.0000 mg | SUBCUTANEOUS | 1 refills | Status: DC

## 2020-09-25 ENCOUNTER — Other Ambulatory Visit: Payer: Self-pay | Admitting: Physician Assistant

## 2020-09-25 NOTE — Telephone Encounter (Signed)
[place] pt.

## 2020-10-04 ENCOUNTER — Other Ambulatory Visit: Payer: Self-pay | Admitting: Endocrinology

## 2020-10-04 ENCOUNTER — Other Ambulatory Visit: Payer: Self-pay | Admitting: Physician Assistant

## 2020-10-04 NOTE — Telephone Encounter (Signed)
Refill Request.  

## 2020-11-25 ENCOUNTER — Other Ambulatory Visit: Payer: Self-pay

## 2020-11-25 ENCOUNTER — Other Ambulatory Visit: Payer: 59

## 2020-11-28 ENCOUNTER — Ambulatory Visit: Payer: 59 | Admitting: Endocrinology

## 2020-12-27 ENCOUNTER — Other Ambulatory Visit (INDEPENDENT_AMBULATORY_CARE_PROVIDER_SITE_OTHER): Payer: 59

## 2020-12-27 ENCOUNTER — Other Ambulatory Visit: Payer: Self-pay

## 2020-12-27 DIAGNOSIS — E1165 Type 2 diabetes mellitus with hyperglycemia: Secondary | ICD-10-CM

## 2020-12-27 DIAGNOSIS — Z794 Long term (current) use of insulin: Secondary | ICD-10-CM

## 2020-12-27 LAB — COMPREHENSIVE METABOLIC PANEL
ALT: 10 U/L (ref 0–53)
AST: 14 U/L (ref 0–37)
Albumin: 4.2 g/dL (ref 3.5–5.2)
Alkaline Phosphatase: 99 U/L (ref 39–117)
BUN: 11 mg/dL (ref 6–23)
CO2: 30 mEq/L (ref 19–32)
Calcium: 9 mg/dL (ref 8.4–10.5)
Chloride: 102 mEq/L (ref 96–112)
Creatinine, Ser: 0.84 mg/dL (ref 0.40–1.50)
GFR: 105.34 mL/min (ref >60.00)
Glucose, Bld: 118 mg/dL — ABNORMAL HIGH (ref 70–99)
Potassium: 4.5 mEq/L (ref 3.5–5.1)
Sodium: 140 mEq/L (ref 135–145)
Total Bilirubin: 0.5 mg/dL (ref 0.2–1.2)
Total Protein: 6.7 g/dL (ref 6.0–8.3)

## 2020-12-27 LAB — MICROALBUMIN / CREATININE URINE RATIO
Creatinine,U: 98.5 mg/dL
Microalb Creat Ratio: 2.3 mg/g (ref 0.0–30.0)
Microalb, Ur: 2.2 mg/dL — ABNORMAL HIGH (ref 0.0–1.9)

## 2020-12-27 LAB — HEMOGLOBIN A1C: Hgb A1c MFr Bld: 6.8 % — ABNORMAL HIGH (ref 4.6–6.5)

## 2020-12-31 ENCOUNTER — Other Ambulatory Visit: Payer: Self-pay

## 2020-12-31 ENCOUNTER — Ambulatory Visit (INDEPENDENT_AMBULATORY_CARE_PROVIDER_SITE_OTHER): Payer: 59 | Admitting: Endocrinology

## 2020-12-31 ENCOUNTER — Encounter: Payer: Self-pay | Admitting: Endocrinology

## 2020-12-31 VITALS — BP 118/84 | HR 86 | Ht 72.0 in | Wt 265.4 lb

## 2020-12-31 DIAGNOSIS — E669 Obesity, unspecified: Secondary | ICD-10-CM | POA: Diagnosis not present

## 2020-12-31 DIAGNOSIS — E1169 Type 2 diabetes mellitus with other specified complication: Secondary | ICD-10-CM | POA: Diagnosis not present

## 2020-12-31 DIAGNOSIS — I10 Essential (primary) hypertension: Secondary | ICD-10-CM | POA: Diagnosis not present

## 2020-12-31 DIAGNOSIS — Z23 Encounter for immunization: Secondary | ICD-10-CM

## 2020-12-31 MED ORDER — TRULICITY 4.5 MG/0.5ML ~~LOC~~ SOAJ: 4.5000 mg | SUBCUTANEOUS | 2 refills | Status: DC

## 2020-12-31 NOTE — Progress Notes (Signed)
Patient ID: Todd Gaines, male   DOB: [birth year], 45 y.o.   MRN: [id number]           Reason for Appointment: Follow-up for Type 2 Diabetes  Referring physician: [facility]   History of Present Illness:          Date of diagnosis of type 2 diabetes mellitus: 2012       Background history:   He believes her A1c was 12% when he was first diagnosed to have diabetes and was having symptoms of feeling very tired. Initially he made changes in his lifestyle and was able to lose some weight also He was probably treated with metformin and may have been given Tradjenta subsequently Previous records are not available He thinks his A1c had been around 7-7.5% for some time until last year He was started on basal insulin in 9/16 when his A1c was 9.9 He did not use the V-go pump because of lack of adequate insurance coverage and out-of-pocket expense, this was improving his control  Recent history:   INSULIN regimen previously: Glargine insulin 40 units in the evening, Humalog before  dinner usually 10-12 units Previous OMNIPOD PUMP SETTINGS: Basal rate 1.25 midnight-6 AM and then 1.5 Carbohydrate ratio 1:1, correction 1: 50 and target 120-140, active insulin 4 hours   Non-insulin hypoglycemic drugs: Metformin ER 9381 mg a day, Trulicity 3 weekly, Jardiance 69m  The A1c is 6.8 vs 7.1, previously 7.3  Current blood sugar patterns and problems identified:   He has not checked his blood sugars much and does not keep a record  Previously using the Dexcom but he stopped using this, also previously was not using the clarity app  His weight has leveled off  He thinks his blood sugars are usually pretty good but relatively higher if he is drinking his [facility] the morning which he does regularly  Otherwise he has cut back on sweet tea  He has generally been trying to plan his meals well  However is not doing much formal exercise except some walking at work or at the ball field  Recently he  had malfunctioning devices for his Trulicity and he missed 2 doses, he did not let uKore[place]now that Glucose was 118 in the lab   Side effects from medications have been: Transient nausea from Trulicity  Compliance with the medical regimen: Fair    Glucose monitoring:  As above  Blood Glucose readings by recall  After meals highest about 150 Fasting readings <120   Self-care:  Typical meal intake: Breakfast is eggs, sausage sometimes, otherwise may have breakfast burrito  Snacks will be peanut butter crackers, chips, pretzels and peanut butter               Dietician visit, most recent: 10/16                Weight history: 275-298 lbs  Wt Readings from Last 3 Encounters:  12/31/20 265 lb 6.4 oz (120.4 kg)  08/26/20 265 lb 9.6 oz (120.5 kg)  08/16/20 259 lb (117.5 kg)    Glycemic control:   Lab Results  Component Value Date   HGBA1C 6.8 (H) 12/27/2020   HGBA1C 7.1 (H) 08/14/2020   HGBA1C 7.3 (H) 05/08/2020   Lab Results  Component Value Date   MICROALBUR 2.2 (H) 12/27/2020   LDLCALC 37 05/08/2020   CREATININE 0.84 12/27/2020    Lab on 12/27/2020  Component Date Value Ref Range Status   Microalb, Ur 12/27/2020  2.2 (A) 0.0 - 1.9 mg/dL Final   Creatinine,U 12/27/2020 98.5  mg/dL Final   Microalb Creat Ratio 12/27/2020 2.3  0.0 - 30.0 mg/g Final   Sodium 12/27/2020 140  135 - 145 mEq/L Final   Potassium 12/27/2020 4.5  3.5 - 5.1 mEq/L Final   Chloride 12/27/2020 102  96 - 112 mEq/L Final   CO2 12/27/2020 30  19 - 32 mEq/L Final   Glucose, Bld 12/27/2020 118 (A) 70 - 99 mg/dL Final   BUN 12/27/2020 11  6 - 23 mg/dL Final   Creatinine, Ser 12/27/2020 0.84  0.40 - 1.50 mg/dL Final   Total Bilirubin 12/27/2020 0.5  0.2 - 1.2 mg/dL Final   Alkaline Phosphatase 12/27/2020 99  39 - 117 U/L Final   AST 12/27/2020 14  0 - 37 U/L Final   ALT 12/27/2020 10  0 - 53 U/L Final   Total Protein 12/27/2020 6.7  6.0 - 8.3 g/dL Final   Albumin 12/27/2020 4.2  3.5 - 5.2 g/dL Final    GFR 12/27/2020 105.34  >60.00 mL/min Final   Calculated using the CKD-EPI Creatinine Equation (2021)   Calcium 12/27/2020 9.0  8.4 - 10.5 mg/dL Final   Hgb A1c MFr Bld 12/27/2020 6.8 (A) 4.6 - 6.5 % Final   Glycemic Control Guidelines for People with Diabetes:Non Diabetic:  <6%Goal of Therapy: <7%Additional Action Suggested:  >8%       Allergies as of 12/31/2020   No Known Allergies      Medication List        Accurate as of December 31, 2020 11:56 AM. If you have any questions, ask your nurse or doctor.          aspirin EC 81 MG tablet Take 81 mg by mouth daily. Swallow whole.   atorvastatin 80 MG tablet Commonly known as: LIPITOR TAKE ONE TABLET BY MOUTH DAILY   Contour Next One Kit 1 each by Does not apply route in the morning, at noon, in the evening, and at bedtime. Use Bayer Contour Next One meter to check blood sugar 4 times daily.   Dexcom G6 Sensor Misc Use to monitor blood sugar, change after 10 days   Dexcom G6 Transmitter Misc Use one transmitter every 90 days   glucose blood test strip Use OneTouch Ultra test strips as instructed to check blood sugar 4 times daily. DX:E11.65   insulin lispro 100 UNIT/ML injection Commonly known as: HumaLOG Use max of 55 units daily via insulin pump.   Jardiance 25 MG Tabs tablet Generic drug: empagliflozin TAKE ONE TABLET BY MOUTH DAILY BEFORE BREAKFAST. START WHEN FINISHED ON INVOKANA   metFORMIN 500 MG 24 hr tablet Commonly known as: GLUCOPHAGE-XR TAKE 4 TABLETS BY MOUTH DAILY   metoprolol tartrate 25 MG tablet Commonly known as: LOPRESSOR TAKE ONE TABLET BY MOUTH TWICE A DAY   nitroGLYCERIN 0.4 MG SL tablet Commonly known as: NITROSTAT Place 1 tablet (0.4 mg total) under the tongue every 5 (five) minutes as needed for chest pain.   NovoTwist 32G X 5 MM Misc Generic drug: Insulin Pen Needle Use one per day to inject victoza   Omnipod DASH Pods (Gen 4) Misc Apply one pod to body once every 3 days for  insulin delivery.   Stelara 90 MG/ML Sosy injection Generic drug: ustekinumab Inject 90 mg into the skin once. Every 3 months.   Trulicity 3 EU/2.3NT Sopn Generic drug: Dulaglutide Inject 3 mg into the skin once a week.  Allergies: No Known Allergies  Past Medical History:  Diagnosis Date   CAD (coronary artery disease)    Diabetes mellitus without complication ([place])    Elevated cholesterol    Psoriasis     Past Surgical History:  Procedure Laterality Date   CORONARY STENT INTERVENTION N/A 09/08/2019   Procedure: CORONARY STENT INTERVENTION;  Surgeon: Nelva Bush, MD;  Location: [place] [facility];  Service: Cardiovascular;  Laterality: N/A;   LEFT HEART CATH AND CORONARY ANGIOGRAPHY N/A 09/08/2019   Procedure: LEFT HEART CATH AND CORONARY ANGIOGRAPHY;  Surgeon: Nelva Bush, MD;  Location: [place] [facility];  Service: Cardiovascular;  Laterality: N/A;    Family History  Problem Relation Age of Onset   Diabetes Mother    Diabetes Paternal Grandfather    Hypertension Paternal Grandfather    Heart disease Neg Hx    Cancer Neg Hx     Social History:  reports that he quit smoking about 18 years ago. His smoking use included cigarettes. He has a 2.50 pack-year smoking history. He has never used smokeless tobacco. He reports that he does not drink alcohol and does not use drugs.    Review of Systems    Lipid history: On treatment with Lipitor 80 mg from his cardiologist since his diagnosis of CAD  Has persistently low HDL Triglycerides improved   Lab Results  Component Value Date   CHOL 88 05/08/2020   HDL 30.70 (L) 05/08/2020   LDLCALC 37 05/08/2020   LDLDIRECT 90.0 06/27/2019   TRIG 104.0 05/08/2020   CHOLHDL 3 05/08/2020             Blood pressure normal without medications He is on Jardiance 25 mg daily  BP Readings from Last 3 Encounters:  12/31/20 118/84  08/26/20 138/85  08/16/20 112/70   Has had normal renal function  consistently  Lab Results  Component Value Date   CREATININE 0.84 12/27/2020   CREATININE 0.90 08/14/2020   CREATININE 0.83 05/08/2020       Physical Examination:  BP 118/84   Pulse 86   Ht 6' (1.829 m)   Wt 265 lb 6.4 oz (120.4 kg)   SpO2 96%   BMI 35.99 kg/m        ASSESSMENT:  Diabetes type 2, with obesity  See history of present illness for detailed discussion of his current management, blood sugar patterns and problems identified  His A1c is slightly better at 6.8 compared to 7.1  He is using Trulicity 3 mg, Metformin and Jardiance  He has been able to keep his blood sugar controlled without insulin  Likely has done fairly well with his diet except for 1 regular soft drink in the morning  Weight has leveled off However he is generally fairly active  Blood sugars appear to be fairly close to target by recall but he is likely not checking much Has not used the Dexcom as before  Blood pressure is relatively better today   No microalbuminuria   PLAN:    Will go up again on his Trulicity, try 4.5 mg weekly  May also consider Mounjaro if he does not lose weight further Recommended that he only have half a can of [facility] instead of the full 12 ounces need in the morning  Check blood sugars after meals more consistently and bring monitor on each visit for download No change in Jardiance He will need to contact the manufacturer if he has further difficulties with the device for Trulicity  To be very consistent with diet again Try to start brisk walking on his days off and at least when he is not as active  Continue to monitor blood pressure and consider starting an ARB drug   Flu vaccine given  Patient Instructions  Check blood sugars on waking up 2-3 days a week  Also check blood sugars about 2 hours after meals and do this after different meals by rotation  Recommended blood sugar levels on waking up are 90-130 and about 2 hours after meal is  130-160  Please bring your blood sugar monitor to each visit, thank you      Elayne Snare 12/31/2020, 11:56 AM   Note: This office note was prepared with Dragon voice recognition system technology. Any transcriptional errors that result from this process are unintentional.

## 2020-12-31 NOTE — Patient Instructions (Signed)
Check blood sugars on waking up 2-3 days a week  Also check blood sugars about 2 hours after meals and do this after different meals by rotation  Recommended blood sugar levels on waking up are 90-130 and about 2 hours after meal is 130-160  Please bring your blood sugar monitor to each visit, thank you   

## 2021-01-03 ENCOUNTER — Other Ambulatory Visit: Payer: Self-pay | Admitting: Endocrinology

## 2021-02-03 ENCOUNTER — Other Ambulatory Visit: Payer: Self-pay | Admitting: Endocrinology

## 2021-02-17 ENCOUNTER — Telehealth: Payer: Self-pay | Admitting: *Deleted

## 2021-02-17 DIAGNOSIS — Z0189 Encounter for other specified special examinations: Secondary | ICD-10-CM

## 2021-02-17 DIAGNOSIS — I7121 Aneurysm of the ascending aorta, without rupture: Secondary | ICD-10-CM

## 2021-02-17 NOTE — Telephone Encounter (Signed)
-----   Message from Fabiola Backer, [place] sent at 02/17/2021  8:18 AM EST ----- Regarding: BMET NEEDED Can someone please place a BMET order for this patient? I will call him for scheduling..  Thanks

## 2021-02-17 NOTE — Telephone Encounter (Signed)
Order for BMET placed on this pt, as requested by CT Dept and per CT protocol.  Will make CT Dept aware of completion so they can get the pt scheduled accordingly.

## 2021-02-21 ENCOUNTER — Other Ambulatory Visit: Payer: 59 | Admitting: *Deleted

## 2021-02-21 ENCOUNTER — Other Ambulatory Visit: Payer: Self-pay

## 2021-02-21 DIAGNOSIS — I2510 Atherosclerotic heart disease of native coronary artery without angina pectoris: Secondary | ICD-10-CM

## 2021-02-21 DIAGNOSIS — E1159 Type 2 diabetes mellitus with other circulatory complications: Secondary | ICD-10-CM

## 2021-02-21 LAB — BASIC METABOLIC PANEL
BUN/Creatinine Ratio: 10 (ref 9–20)
BUN: 9 mg/dL (ref 6–24)
CO2: 24 mmol/L (ref 20–29)
Calcium: 9.1 mg/dL (ref 8.7–10.2)
Chloride: 102 mmol/L (ref 96–106)
Creatinine, Ser: 0.88 mg/dL (ref 0.76–1.27)
Glucose: 116 mg/dL — ABNORMAL HIGH (ref 70–99)
Potassium: 4.6 mmol/L (ref 3.5–5.2)
Sodium: 141 mmol/L (ref 134–144)
eGFR: 108 mL/min/{1.73_m2} (ref >59)

## 2021-02-24 ENCOUNTER — Ambulatory Visit: Payer: 59 | Admitting: Internal Medicine

## 2021-02-28 ENCOUNTER — Ambulatory Visit (INDEPENDENT_AMBULATORY_CARE_PROVIDER_SITE_OTHER)
Admission: RE | Admit: 2021-02-28 | Discharge: 2021-02-28 | Disposition: A | Discharge status: Home / Self Care | Payer: 59 | Source: Ambulatory Visit | Attending: Internal Medicine | Admitting: Internal Medicine

## 2021-02-28 ENCOUNTER — Other Ambulatory Visit: Payer: Self-pay

## 2021-02-28 DIAGNOSIS — I7121 Aneurysm of the ascending aorta, without rupture: Secondary | ICD-10-CM

## 2021-02-28 DIAGNOSIS — I712 Thoracic aortic aneurysm, without rupture, unspecified: Secondary | ICD-10-CM

## 2021-02-28 MED ORDER — IOHEXOL 350 MG/ML SOLN
100.0000 mL | Freq: Once | INTRAVENOUS | Status: AC | PRN
  Administered 2021-02-28: 100 mL via INTRAVENOUS

## 2021-03-18 NOTE — Progress Notes (Signed)
Cardiology Office Note:    Date:  03/21/2021   ID:  Todd Gaines, DOB [birth year], MRN [id number]  Pronounced; Per' mar  PCP:  Judy Pimpleower, Marne A, MD  [facility] Cardiologist:  Christell ConstantMahesh A Chong January, MD  [facility] Electrophysiologist:  None   CC:  Follow up CAD  History of Present Illness:    Todd Gaines is a 46 y.o. male with a hx of CAD (NSTEMI 6/21), DM with HTN, DM with HLD, Mild thoracic aortic aneurysm who presents for evaluation 04/17/20 ([facility]  Had been seen in follow up 12/25/2019 with resolution of chest pain.  Initial angina equivalent is chest heaviness. In interim of this visit, patient's arm has improved.  Surgery was scheduled for 09/23/20.  Seen 03/21/21.  Aorta looked good at last eval (02/28/21).   Patient notes that he is doing well.   Surgery went OK.  Still doesn't have all of his movement back yet.  Had some labrum and biceps issues.  It was cleaned up but isn't 100 %.  Can still underhand pitch.  No chest pain or pressure .  No SOB/DOE and no PND/Orthopnea.  No weight gain or leg swelling.  No palpitations or syncope. No long insulin.    Past Medical History:  Diagnosis Date   CAD (coronary artery disease)    Diabetes mellitus without complication (HCC)    Elevated cholesterol    Psoriasis     Past Surgical History:  Procedure Laterality Date   CORONARY STENT INTERVENTION N/A 09/08/2019   Procedure: CORONARY STENT INTERVENTION;  Surgeon: Yvonne KendallEnd, Christopher, MD;  Location: [facility] INVASIVE CV LAB;  Service: Cardiovascular;  Laterality: N/A;   LEFT HEART CATH AND CORONARY ANGIOGRAPHY N/A 09/08/2019   Procedure: LEFT HEART CATH AND CORONARY ANGIOGRAPHY;  Surgeon: Yvonne KendallEnd, Christopher, MD;  Location: [facility] INVASIVE CV LAB;  Service: Cardiovascular;  Laterality: N/A;    Current Medications: No outpatient medications have been marked as taking for the 03/21/21 encounter (Office Visit) with Christell Constanthandrasekhar, Dazia Lippold A, MD.      Allergies:   Patient has no known allergies.   Social History   Socioeconomic History   Marital status: Married    Spouse name: Not on file   Number of children: Not on file   Years of education: Not on file   Highest education level: Not on file  Occupational History   Not on file  Tobacco Use   Smoking status: Former    Packs/day: 0.25    Years: 10.00    Pack years: 2.50    Types: Cigarettes    Quit date: 04/17/2002    Years since quitting: 18.9   Smokeless tobacco: Never  Vaping Use   Vaping Use: Never used  Substance and Sexual Activity   Alcohol use: No    Alcohol/week: 0.0 standard drinks   Drug use: No   Sexual activity: Yes  Other Topics Concern   Not on file  Social History Narrative   Not on file   Social Determinants of Health   Financial Resource Strain: Not on file  Food Insecurity: Not on file  Transportation Needs: Not on file  Physical Activity: Not on file  Stress: Not on file  Social Connections: Not on file    Social: [employer], Daughter is on the team  Family History: The patient's family history includes Diabetes in his mother and paternal grandfather; Hypertension in his paternal grandfather. There is no history of Heart disease  or Cancer.  History of coronary artery disease notable for no members. History of heart failure notable for no members. No history of cardiomyopathies including hypertrophic cardiomyopathy, left ventricular non-compaction, or arrhythmogenic right ventricular cardiomyopathy. History of arrhythmia notable for no members. Grandfather died of sudden cardiac death at the age of 5 No history of bicuspid aortic valve or aortic aneurysm or dissection.  ROS:   Please see the history of present illness.     All other systems reviewed and are negative.  EKGs/Labs/Other Studies Reviewed:    The following studies were reviewed today:  EKG:   03/21/21 SR rate88 Q wave in III 01/05/20: SR 66 inferior  TWI  Transthoracic Echocardiogram: Date: 04/17/20 Results:   1. Left ventricular ejection fraction, by estimation, is 55 to 60%. The  left ventricle has normal function. The left ventricle demonstrates  regional wall motion abnormalities (see scoring diagram/findings for  description). There is mild left ventricular   hypertrophy. Left ventricular diastolic parameters were normal. There is  severe hypokinesis of the left ventricular, basal-mid inferior wall and  inferoseptal wall.   2. Right ventricular systolic function is normal. The right ventricular  size is normal. Tricuspid regurgitation signal is inadequate for assessing  PA pressure.   3. The mitral valve is grossly normal. No evidence of mitral valve  regurgitation. No evidence of mitral stenosis.   4. The aortic valve has an indeterminant number of cusps. Aortic valve  regurgitation is not visualized. No aortic stenosis is present.   5. Aortic dilatation noted. There is mild dilatation of the aortic root  measuring 40 mm.   6. The inferior vena cava is normal in size with <50% respiratory  variability, suggesting right atrial pressure of 8 mmHg.   Left/Right Heart Catheterizations: Date: 09/08/19 Results: Conclusions: Two-vessel coronary artery disease with 60% distal LCx stenosis and thrombotic occlusion of the distal RCA. Mildly elevated left ventricular filling pressure (LVEDP ~20 mmHg). Successful PCI to distal RCA using resolute Onyx 3.0 x 38 mm drug-eluting stent with 0% residual stenosis and TIMI-3 flow.   Recommendations: Dual antiplatelet therapy with aspirin and prasugrel for 12 months. Aggressive secondary prevention. Favor medical therapy of moderate distal LCx disease.  If Mr. Macmaster has recurrent chest pain, functional assessment of this lesion and PCI, if hemodynamically significant, will need to be considered.     Recent Labs: 12/27/2020: ALT 10 02/21/2021: BUN 9; Creatinine, Ser 0.88; Potassium 4.6;  Sodium 141  Recent Lipid Panel    Component Value Date/Time   CHOL 88 05/08/2020 0746   TRIG 104.0 05/08/2020 0746   HDL 30.70 (L) 05/08/2020 0746   CHOLHDL 3 05/08/2020 0746   VLDL 20.8 05/08/2020 0746   LDLCALC 37 05/08/2020 0746   LDLDIRECT 90.0 06/27/2019 1507    Physical Exam:    VS:  BP 110/68    Pulse 88    Ht 6' (1.829 m)    Wt 119.7 kg    SpO2 96%    BMI 35.80 kg/m     Wt Readings from Last 3 Encounters:  03/21/21 119.7 kg  12/31/20 120.4 kg  08/26/20 120.5 kg    Gen: No distress Neck: No JVD Cardiac: No Rubs or Gallops, no Murmur, regular rate +2 radial pulses Respiratory: Clear to auscultation bilaterally, normal effort, normal  respiratory rate GI: Soft, nontender, non-distended  MS: No  edema;  moves all extremities but limited ROM of R arm Integument: Skin feels well Neuro:  At time of evaluation, alert and  oriented to person/place/time/situation  Psych: Normal affect, patient feels well   ASSESSMENT:    1. Coronary artery disease involving native coronary artery of native heart without angina pectoris   2. Morbid obesity (HCC)   3. Hypertension associated with diabetes (HCC)   4. Mixed hyperlipidemia     PLAN:    Coronary Artery Disease; Obstructive HLD HTN and DM Obesity No evidence of  Thoracic Aortic Aneurysm by CT - asymptomatic  - anatomy:  60% distal LCx stenosis  - continue ASA 81 mg - continue statin, goal LDL < 55 at goal) - continue BB - continue PRN nitrates (none needed) - patient is on Jardiance  One year follow up with me  Medication Adjustments/Labs and Tests Ordered: Current medicines are reviewed at length with the patient today.  Concerns regarding medicines are outlined above.  No orders of the defined types were placed in this encounter.  No orders of the defined types were placed in this encounter.   Patient Instructions  Medication Instructions:  Your physician recommends that you continue on your current  medications as directed. Please refer to the Current Medication list given to you today.  *If you need a refill on your cardiac medications before your next appointment, please call your pharmacy*   Lab Work: NONE If you have labs (blood work) drawn today and your tests are completely normal, you will receive your results only by: MyChart Message (if you have MyChart) OR A paper copy in the mail If you have any lab test that is abnormal or we need to change your treatment, we will call you to review the results.   Testing/Procedures: NONE   Follow-Up: At [facility], you and your health needs are our priority.  As part of our continuing mission to provide you with exceptional heart care, we have created designated Provider Care Teams.  These Care Teams include your primary Cardiologist (physician) and Advanced Practice Providers (APPs -  Physician Assistants and Nurse Practitioners) who all work together to provide you with the care you need, when you need it.   Your next appointment:   1 year(s)  The format for your next appointment:   In Person  Provider:   Christell Constant, MD       Signed, Christell Constant, MD  03/21/2021 12:09 PM    [facility]

## 2021-03-21 ENCOUNTER — Encounter: Payer: Self-pay | Admitting: Internal Medicine

## 2021-03-21 ENCOUNTER — Other Ambulatory Visit: Payer: Self-pay

## 2021-03-21 ENCOUNTER — Ambulatory Visit: Payer: 59 | Admitting: Internal Medicine

## 2021-03-21 VITALS — BP 110/68 | HR 88 | Ht 72.0 in | Wt 264.0 lb

## 2021-03-21 DIAGNOSIS — E782 Mixed hyperlipidemia: Secondary | ICD-10-CM

## 2021-03-21 DIAGNOSIS — I152 Hypertension secondary to endocrine disorders: Secondary | ICD-10-CM

## 2021-03-21 DIAGNOSIS — E1159 Type 2 diabetes mellitus with other circulatory complications: Secondary | ICD-10-CM

## 2021-03-21 DIAGNOSIS — I2510 Atherosclerotic heart disease of native coronary artery without angina pectoris: Secondary | ICD-10-CM

## 2021-03-21 NOTE — Patient Instructions (Signed)
Medication Instructions:  °Your physician recommends that you continue on your current medications as directed. Please refer to the Current Medication list given to you today. ° °*If you need a refill on your cardiac medications before your next appointment, please call your pharmacy* ° ° °Lab Work: °NONE °If you have labs (blood work) drawn today and your tests are completely normal, you will receive your results only by: °MyChart Message (if you have MyChart) OR °A paper copy in the mail °If you have any lab test that is abnormal or we need to change your treatment, we will call you to review the results. ° ° °Testing/Procedures: °NONE ° ° °Follow-Up: °At [facility], you and your health needs are our priority.  As part of our continuing mission to provide you with exceptional heart care, we have created designated Provider Care Teams.  These Care Teams include your primary Cardiologist (physician) and Advanced Practice Providers (APPs -  Physician Assistants and Nurse Practitioners) who all work together to provide you with the care you need, when you need it. ° ° °Your next appointment:   °1 year(s) ° °The format for your next appointment:   °In Person ° °Provider:   °Mahesh A Chandrasekhar, MD  °  ° ° ° ° °

## 2021-04-22 ENCOUNTER — Other Ambulatory Visit: Payer: Self-pay

## 2021-04-22 MED ORDER — ATORVASTATIN CALCIUM 80 MG PO TABS: 80.0000 mg | ORAL_TABLET | Freq: Every day | ORAL | 3 refills | Status: DC

## 2021-05-03 ENCOUNTER — Other Ambulatory Visit: Payer: Self-pay | Admitting: Endocrinology

## 2021-05-03 DIAGNOSIS — E1169 Type 2 diabetes mellitus with other specified complication: Secondary | ICD-10-CM

## 2021-05-03 DIAGNOSIS — E782 Mixed hyperlipidemia: Secondary | ICD-10-CM

## 2021-05-03 DIAGNOSIS — E669 Obesity, unspecified: Secondary | ICD-10-CM

## 2021-05-06 ENCOUNTER — Other Ambulatory Visit (INDEPENDENT_AMBULATORY_CARE_PROVIDER_SITE_OTHER): Payer: 59

## 2021-05-06 ENCOUNTER — Other Ambulatory Visit: Payer: Self-pay

## 2021-05-06 DIAGNOSIS — E1169 Type 2 diabetes mellitus with other specified complication: Secondary | ICD-10-CM

## 2021-05-06 DIAGNOSIS — E669 Obesity, unspecified: Secondary | ICD-10-CM

## 2021-05-06 DIAGNOSIS — E782 Mixed hyperlipidemia: Secondary | ICD-10-CM | POA: Diagnosis not present

## 2021-05-06 LAB — BASIC METABOLIC PANEL
BUN: 13 mg/dL (ref 6–23)
CO2: 33 mEq/L — ABNORMAL HIGH (ref 19–32)
Calcium: 9.4 mg/dL (ref 8.4–10.5)
Chloride: 100 mEq/L (ref 96–112)
Creatinine, Ser: 0.92 mg/dL (ref 0.40–1.50)
GFR: 100.23 mL/min (ref >60.00)
Glucose, Bld: 126 mg/dL — ABNORMAL HIGH (ref 70–99)
Potassium: 4.9 mEq/L (ref 3.5–5.1)
Sodium: 138 mEq/L (ref 135–145)

## 2021-05-06 LAB — LIPID PANEL
Cholesterol: 93 mg/dL (ref 0–200)
HDL: 27.2 mg/dL — ABNORMAL LOW (ref >39.00)
LDL Cholesterol: 46 mg/dL (ref 0–99)
NonHDL: 65.31
Total CHOL/HDL Ratio: 3
Triglycerides: 95 mg/dL (ref 0.0–149.0)
VLDL: 19 mg/dL (ref 0.0–40.0)

## 2021-05-06 LAB — HEMOGLOBIN A1C: Hgb A1c MFr Bld: 6.9 % — ABNORMAL HIGH (ref 4.6–6.5)

## 2021-05-08 ENCOUNTER — Encounter: Payer: Self-pay | Admitting: Endocrinology

## 2021-05-08 ENCOUNTER — Other Ambulatory Visit: Payer: Self-pay

## 2021-05-08 ENCOUNTER — Ambulatory Visit (INDEPENDENT_AMBULATORY_CARE_PROVIDER_SITE_OTHER): Payer: 59 | Admitting: Endocrinology

## 2021-05-08 VITALS — BP 124/82 | HR 96 | Ht 73.0 in | Wt 265.6 lb

## 2021-05-08 DIAGNOSIS — E782 Mixed hyperlipidemia: Secondary | ICD-10-CM

## 2021-05-08 DIAGNOSIS — E1165 Type 2 diabetes mellitus with hyperglycemia: Secondary | ICD-10-CM | POA: Diagnosis not present

## 2021-05-08 NOTE — Progress Notes (Signed)
Patient ID: Todd Gaines, male   DOB: Oct 31, 1975, 46 y.o.   MRN: [id number]           Reason for Appointment: Follow-up for Type 2 Diabetes  Referring physician: [place]   History of Present Illness:          Date of diagnosis of type 2 diabetes mellitus: 2012       Background history:   He believes her A1c was 12% when he was first diagnosed to have diabetes and was having symptoms of feeling very tired. Initially he made changes in his lifestyle and was able to lose some weight also He was probably treated with metformin and may have been given Tradjenta subsequently Previous records are not available He thinks his A1c had been around 7-7.5% for some time until last year He was started on basal insulin in 9/16 when his A1c was 9.9 He did not use the V-go pump because of lack of adequate insurance coverage and out-of-pocket expense, this was improving his control  Recent history:   INSULIN regimen previously: Glargine insulin 40 units in the evening, Humalog before  dinner usually 10-12 units Previous OMNIPOD PUMP SETTINGS: Basal rate 1.25 midnight-6 AM and then 1.5 Carbohydrate ratio 1:1, correction 1: 50 and target 120-140, active insulin 4 hours   Non-insulin hypoglycemic drugs: Metformin ER 2000 mg a day, Trulicity 4.5 weekly, Jardiance 25mg   His A1c is stable at 6.9 Last visit was 10/22  Current blood sugar patterns and problems identified:   He has not brought his monitor for download as he forgets to do this  He thinks he is checking his blood sugars fairly regularly either morning or before or after dinner  Generally has fairly good readings before meals in the morning and before dinnertime unless he is going off his diet  As before he will sometimes have readings just over 200 if he is eating a high-fat meal or any drinks with sugar  He is not doing much formal exercise except some walking with different activities  Has not had any change in his weight He did run  out of Trulicity for 2 weeks until about 2 weeks ago and he felt that his sugars may have been slightly higher than  Also tends to have better satiety level with taking Trulicity regularly  Lab fasting glucose 126   Side effects from medications have been: Transient nausea from Trulicity  Compliance with the medical regimen: Fair    Glucose monitoring: Monitor not available for download  Blood Glucose readings by recall  Am: 110-130 Acs 110-150 PC 150-215    Self-care:  Typical meal intake: Breakfast is eggs, sausage sometimes, otherwise may have breakfast burrito  Snacks will be peanut butter crackers, chips, pretzels and peanut butter               Dietician visit, most recent: 10/16                Weight history: 275-298 lbs  Wt Readings from Last 3 Encounters:  05/08/21 265 lb 9.6 oz (120.5 kg)  03/21/21 264 lb (119.7 kg)  12/31/20 265 lb 6.4 oz (120.4 kg)    Glycemic control:   Lab Results  Component Value Date   HGBA1C 6.9 (H) 05/06/2021   HGBA1C 6.8 (H) 12/27/2020   HGBA1C 7.1 (H) 08/14/2020   Lab Results  Component Value Date   MICROALBUR 2.2 (H) 12/27/2020   LDLCALC 46 05/06/2021   CREATININE 0.92 05/06/2021  Lab on 05/06/2021  Component Date Value Ref Range Status   Cholesterol 05/06/2021 93  0 - 200 mg/dL Final   ATP III Classification       Desirable:  < 200 mg/dL               Borderline High:  200 - 239 mg/dL          High:  > = 240 mg/dL   Triglycerides 05/06/2021 95.0  0.0 - 149.0 mg/dL Final   Normal:  <150 mg/dLBorderline High:  150 - 199 mg/dL   HDL 05/06/2021 27.20 (L)  >39.00 mg/dL Final   VLDL 05/06/2021 19.0  0.0 - 40.0 mg/dL Final   LDL Cholesterol 05/06/2021 46  0 - 99 mg/dL Final   Total CHOL/HDL Ratio 05/06/2021 3   Final                  Men          Women1/2 Average Risk     3.4          3.3Average Risk          5.0          4.42X Average Risk          9.6          7.13X Average Risk          15.0          11.0                        NonHDL 05/06/2021 65.31   Final   NOTE:  Non-HDL goal should be 30 mg/dL higher than patient's LDL goal (i.e. LDL goal of < 70 mg/dL, would have non-HDL goal of < 100 mg/dL)   Sodium 05/06/2021 138  135 - 145 mEq/L Final   Potassium 05/06/2021 4.9  3.5 - 5.1 mEq/L Final   Chloride 05/06/2021 100  96 - 112 mEq/L Final   CO2 05/06/2021 33 (H)  19 - 32 mEq/L Final   Glucose, Bld 05/06/2021 126 (H)  70 - 99 mg/dL Final   BUN 05/06/2021 13  6 - 23 mg/dL Final   Creatinine, Ser 05/06/2021 0.92  0.40 - 1.50 mg/dL Final   GFR 05/06/2021 100.23  >60.00 mL/min Final   Calculated using the CKD-EPI Creatinine Equation (2021)   Calcium 05/06/2021 9.4  8.4 - 10.5 mg/dL Final   Hgb A1c MFr Bld 05/06/2021 6.9 (H)  4.6 - 6.5 % Final   Glycemic Control Guidelines for People with Diabetes:Non Diabetic:  <6%Goal of Therapy: <7%Additional Action Suggested:  >8%       Allergies as of 05/08/2021   No Known Allergies      Medication List        Accurate as of May 08, 2021  3:14 PM. If you have any questions, ask your nurse or doctor.          aspirin EC 81 MG tablet Take 81 mg by mouth daily. Swallow whole.   atorvastatin 80 MG tablet Commonly known as: LIPITOR Take 1 tablet (80 mg total) by mouth daily.   glucose blood test strip Use OneTouch Ultra test strips as instructed to check blood sugar 4 times daily. DX:E11.65   Jardiance 25 MG Tabs tablet Generic drug: empagliflozin TAKE ONE TABLET BY MOUTH DAILY BEFORE BREAKFAST. START WHEN FINISHED WITH INVOKANA.   metFORMIN 500 MG 24 hr tablet Commonly known as: GLUCOPHAGE-XR TAKE FOUR TABLETS  BY MOUTH DAILY What changed:  how much to take how to take this when to take this additional instructions   metoprolol tartrate 25 MG tablet Commonly known as: LOPRESSOR TAKE ONE TABLET BY MOUTH TWICE A DAY   nitroGLYCERIN 0.4 MG SL tablet Commonly known as: NITROSTAT Place 1 tablet (0.4 mg total) under the tongue every 5 (five)  minutes as needed for chest pain.   NovoTwist 32G X 5 MM Misc Generic drug: Insulin Pen Needle Use one per day to inject victoza   Stelara 90 MG/ML Sosy injection Generic drug: ustekinumab Inject 90 mg into the skin once. Every 3 months.   Trulicity 4.5 0000000 Sopn Generic drug: Dulaglutide Inject 4.5 mg as directed once a week.        Allergies: No Known Allergies  Past Medical History:  Diagnosis Date   CAD (coronary artery disease)    Diabetes mellitus without complication ([place])    Elevated cholesterol    Psoriasis     Past Surgical History:  Procedure Laterality Date   CORONARY STENT INTERVENTION N/A 09/08/2019   Procedure: CORONARY STENT INTERVENTION;  Surgeon: Nelva Bush, MD;  Location: [facility];  Service: Cardiovascular;  Laterality: N/A;   LEFT HEART CATH AND CORONARY ANGIOGRAPHY N/A 09/08/2019   Procedure: LEFT HEART CATH AND CORONARY ANGIOGRAPHY;  Surgeon: Nelva Bush, MD;  Location: [place];  Service: Cardiovascular;  Laterality: N/A;    Family History  Problem Relation Age of Onset   Diabetes Mother    Diabetes Paternal Grandfather    Hypertension Paternal Grandfather    Heart disease Neg Hx    Cancer Neg Hx     Social History:  reports that he quit smoking about 19 years ago. His smoking use included cigarettes. He has a 2.50 pack-year smoking history. He has never used smokeless tobacco. He reports that he does not drink alcohol and does not use drugs.    Review of Systems    Lipid history: On treatment with Lipitor 80 mg from his cardiologist since his diagnosis of CAD  Has persistently low HDL Triglycerides improved   Lab Results  Component Value Date   CHOL 93 05/06/2021   HDL 27.20 (L) 05/06/2021   LDLCALC 46 05/06/2021   LDLDIRECT 90.0 06/27/2019   TRIG 95.0 05/06/2021   CHOLHDL 3 05/06/2021             Blood pressure normal without medications He is on Jardiance 25 mg daily  BP Readings from  Last 3 Encounters:  05/08/21 124/82  03/21/21 110/68  12/31/20 118/84   Has had normal renal function consistently  Lab Results  Component Value Date   CREATININE 0.92 05/06/2021   CREATININE 0.88 02/21/2021   CREATININE 0.84 12/27/2020       Physical Examination:  BP 124/82    Pulse 96    Ht 6\' 1"  (1.854 m)    Wt 265 lb 9.6 oz (120.5 kg)    SpO2 96%    BMI 35.04 kg/m        ASSESSMENT:  Diabetes type 2, with obesity  See history of present illness for detailed discussion of his current management, blood sugar patterns and problems identified  His A1c is slightly better at 6.8 compared to 7.1  He is using Trulicity 4.5 mg, Metformin and Jardiance 25 mg  He has been able to keep his blood sugar controlled without insulin for almost a year now  Although he is mostly trying to watch  his diet he still tends to periodically eat higher fat meals which will raise his blood sugars but not consistently  Otherwise postprandial readings are not reportedly over 150 usually He thinks he can do better with exercise regimen  Blood pressure is controlled and he is not on any specific antihypertensives, is on Jardiance No history of microalbuminuria Renal function also quite normal  LIPIDS: LDL is suppressed with taking 80 mg Lipitor, has history of CAD  PLAN:    He will continue the same 3 drug regimen for the diabetes May consider Mounjaro if his blood sugar control is not optimal or he has tendency to weight gain  Discussed need to monitor blood sugars consistently after meals To bring monitor for download on each visit Start regular aerobic walking or other exercise program He will call if he has any persistently high readings  There are no Patient Instructions on file for this visit.     Elayne Snare 05/08/2021, 3:14 PM   Note: This office note was prepared with Dragon voice recognition system technology. Any transcriptional errors that result from this process are  unintentional.

## 2021-05-08 NOTE — Patient Instructions (Signed)
Exercise more  Check blood sugars on waking up 2-3 days a week  Also check blood sugars about 2 hours after meals and do this after different meals by rotation  Recommended blood sugar levels on waking up are 90-130 and about 2 hours after meal is 130-160  Please bring your blood sugar monitor to each visit, thank you  

## 2021-05-27 ENCOUNTER — Other Ambulatory Visit: Payer: Self-pay | Admitting: Endocrinology

## 2021-06-11 ENCOUNTER — Other Ambulatory Visit: Payer: Self-pay | Admitting: Endocrinology

## 2021-07-11 ENCOUNTER — Other Ambulatory Visit: Payer: Self-pay | Admitting: Endocrinology

## 2021-08-07 IMAGING — MR MR SHOULDER*R* W/CM
6 series · 40 of 40 positions shown · IV contrast (agent unspecified)
Comparison: Injection images from 01/26/2020

CLINICAL DATA: Shoulder pain, possible biceps injury.

EXAM:
MR ARTHROGRAM OF THE right SHOULDER
TECHNIQUE: Multiplanar, multisequence MR imaging of the right shoulder was
performed following the administration of intra-articular contrast.
CONTRAST:  See Injection Documentation.

[Series 3: T1 fat-sat · axial · 4.0mm · 0.29mm/px · z∈[-23,+72]mm · 5 of 20 slices shown (1 of 4)]
[im 1/20]
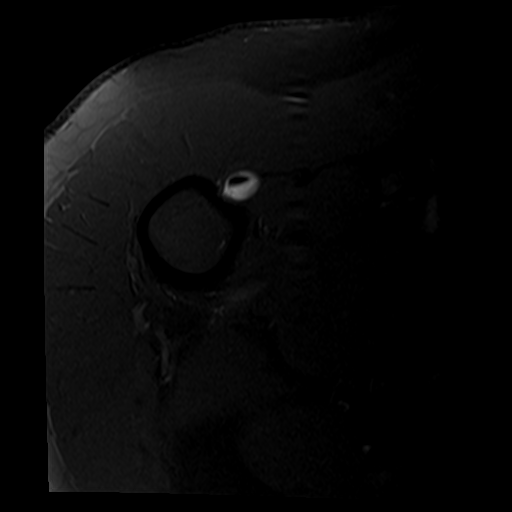
[im 5/20]
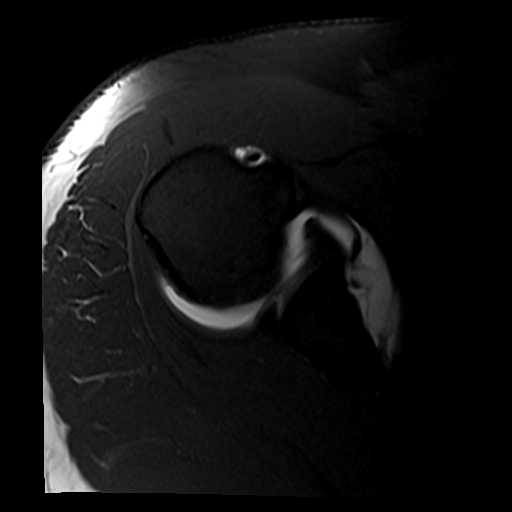
[im 10/20]
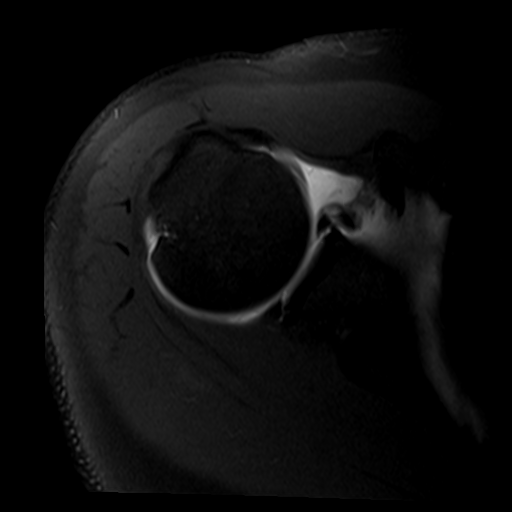
[im 15/20]
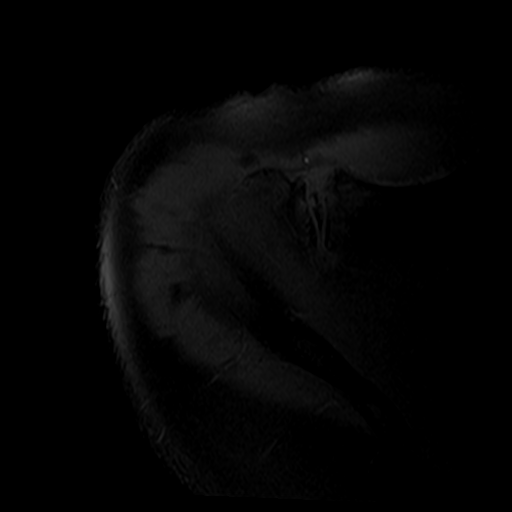
[im 20/20]
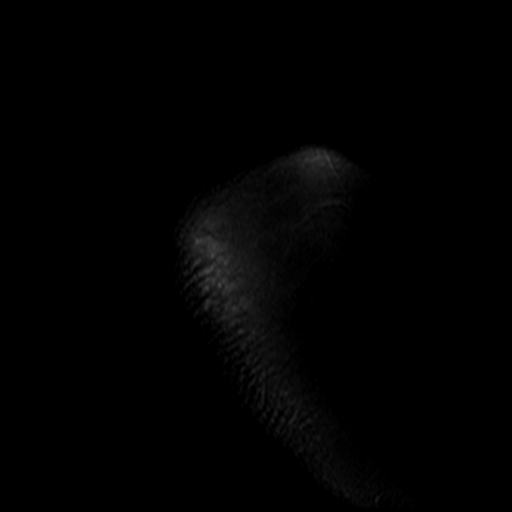

[Series 4: T2 fat-sat · oblique · 4.0mm · 0.59mm/px · 8 of 24 slices shown (1 of 2)]
[im 1/24]
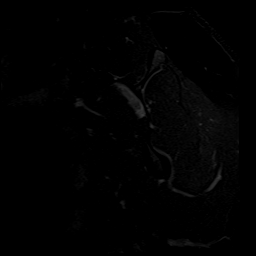
[im 4/24]
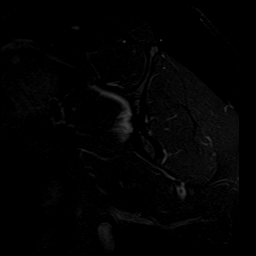
[im 7/24]
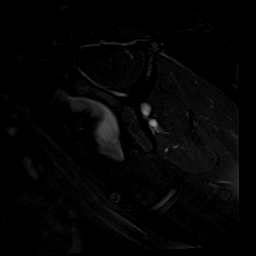
[im 10/24]
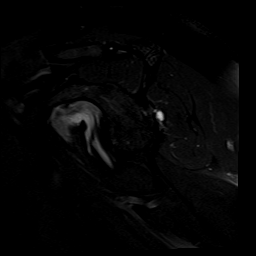
[im 14/24]
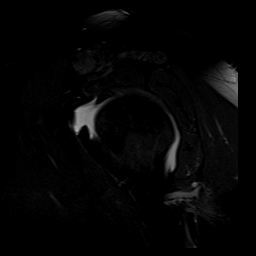
[im 17/24]
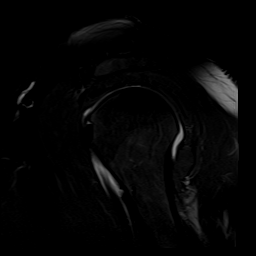
[im 20/24]
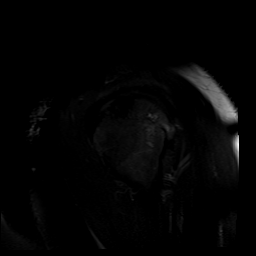
[im 24/24]
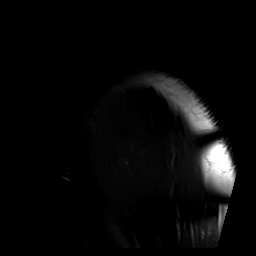

[Series 5: T1 fat-sat · oblique · 4.0mm · 0.59mm/px · 7 of 22 slices shown (2 of 4)]
[im 1/22]
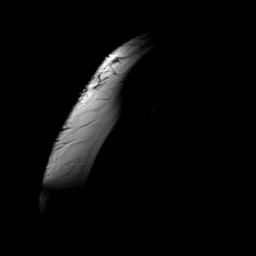
[im 4/22]
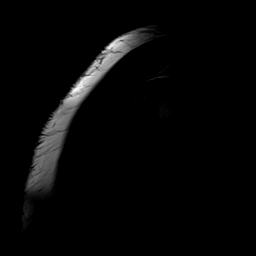
[im 8/22]
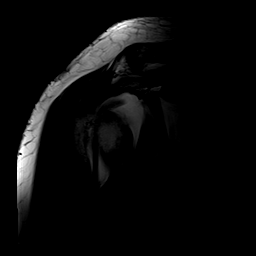
[im 11/22]
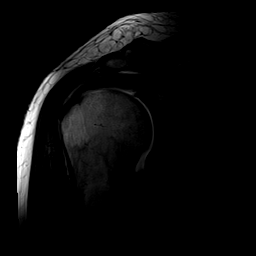
[im 15/22]
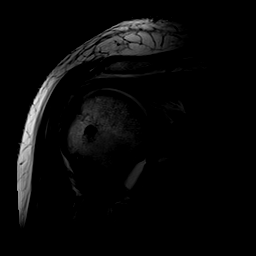
[im 18/22]
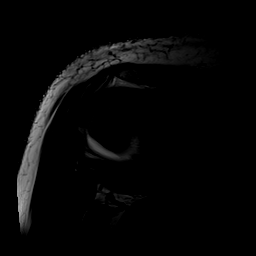
[im 22/22]
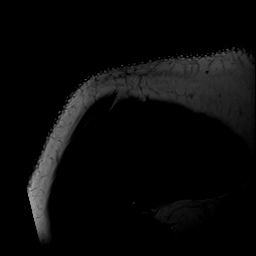

[Series 6: T1 fat-sat · oblique · 4.0mm · 0.59mm/px · 7 of 22 slices shown (3 of 4)]
[im 1/22]
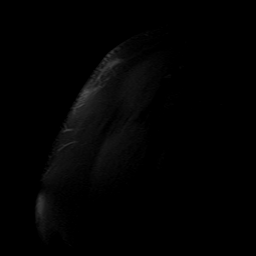
[im 4/22]
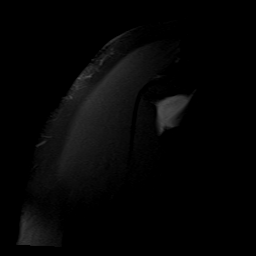
[im 8/22]
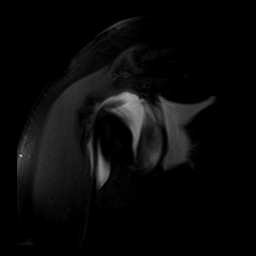
[im 11/22]
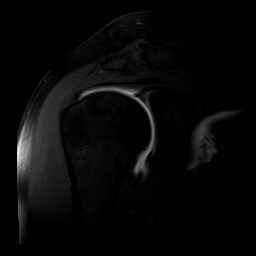
[im 15/22]
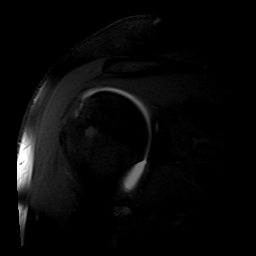
[im 18/22]
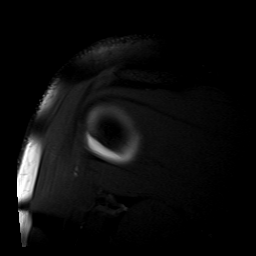
[im 22/22]
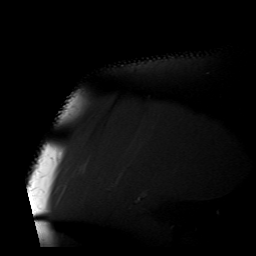

[Series 7: T2 fat-sat · oblique · 4.0mm · 0.59mm/px · 7 of 22 slices shown (2 of 2)]
[im 1/22]
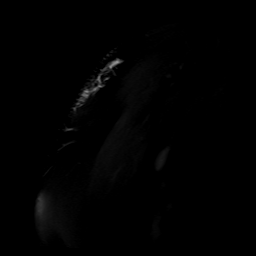
[im 4/22]
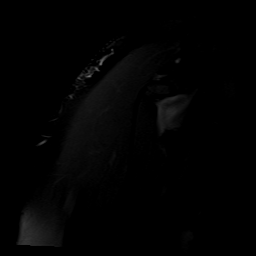
[im 8/22]
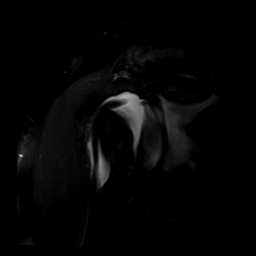
[im 11/22]
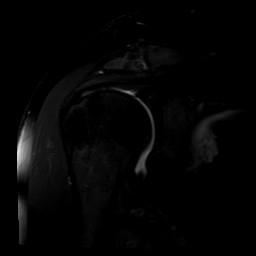
[im 15/22]
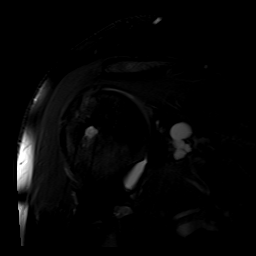
[im 18/22]
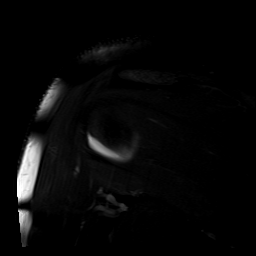
[im 22/22]
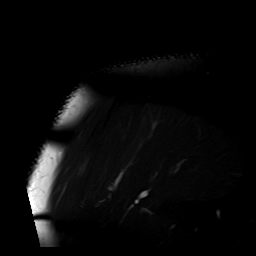

[Series 11: T1 fat-sat · sagittal · 4.0mm · 0.59mm/px · 6 of 18 slices shown (4 of 4)]
[im 1/18]
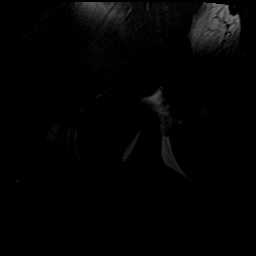
[im 4/18]
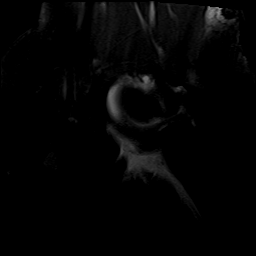
[im 7/18]
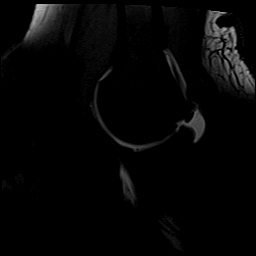
[im 11/18]
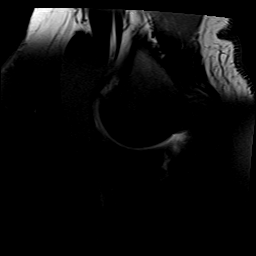
[im 14/18]
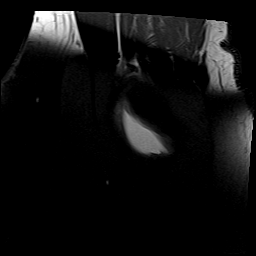
[im 18/18]
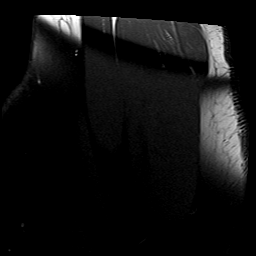

[40 of 40 positions shown; findings below may reference images not displayed]

FINDINGS: Rotator cuff: Unremarkable

Muscles: Unremarkable

Biceps long head: Unremarkable

Acromioclavicular Joint: Moderate spurring and mild subcortical
marrow edema. Subacromial morphology is type 2 (curved). No regional
bursitis.

Glenohumeral Joint: Unremarkable

Labrum: There is a striking degree of labral tearing involving the
superior labrum, posterior labrum, and inferior labrum, with at
least 180 degrees of involvement and possibly more. There is an
associated 2.7 by 2.2 by 0.9 cm posterior paralabral cyst shown on
image 8 of series 7 and image 17 of series 4, this paralabral cyst
does not fill with contrast.

Bones: No significant extra-articular osseous abnormalities
identified.
IMPRESSION: 1. Extensive labral tearing involving the superior labrum, posterior
labrum, and inferior labrum, with an associated posterior paralabral
cyst.
2. Moderate degenerative spurring of the AC joint.
3. No rotator cuff tear identified.

## 2021-09-01 ENCOUNTER — Other Ambulatory Visit (INDEPENDENT_AMBULATORY_CARE_PROVIDER_SITE_OTHER): Payer: 59

## 2021-09-01 DIAGNOSIS — E1165 Type 2 diabetes mellitus with hyperglycemia: Secondary | ICD-10-CM | POA: Diagnosis not present

## 2021-09-01 LAB — BASIC METABOLIC PANEL
BUN: 11 mg/dL (ref 6–23)
CO2: 30 mEq/L (ref 19–32)
Calcium: 9.2 mg/dL (ref 8.4–10.5)
Chloride: 103 mEq/L (ref 96–112)
Creatinine, Ser: 0.88 mg/dL (ref 0.40–1.50)
GFR: 103.37 mL/min (ref >60.00)
Glucose, Bld: 118 mg/dL — ABNORMAL HIGH (ref 70–99)
Potassium: 4.4 mEq/L (ref 3.5–5.1)
Sodium: 141 mEq/L (ref 135–145)

## 2021-09-01 LAB — HEMOGLOBIN A1C: Hgb A1c MFr Bld: 6.9 % — ABNORMAL HIGH (ref 4.6–6.5)

## 2021-09-09 ENCOUNTER — Ambulatory Visit (INDEPENDENT_AMBULATORY_CARE_PROVIDER_SITE_OTHER): Payer: 59 | Admitting: Endocrinology

## 2021-09-09 ENCOUNTER — Encounter: Payer: Self-pay | Admitting: Endocrinology

## 2021-09-09 VITALS — BP 122/82 | HR 89 | Ht 73.0 in | Wt 261.0 lb

## 2021-09-09 DIAGNOSIS — E1169 Type 2 diabetes mellitus with other specified complication: Secondary | ICD-10-CM | POA: Diagnosis not present

## 2021-09-09 DIAGNOSIS — E669 Obesity, unspecified: Secondary | ICD-10-CM | POA: Diagnosis not present

## 2021-09-13 ENCOUNTER — Other Ambulatory Visit: Payer: Self-pay | Admitting: Endocrinology

## 2021-10-09 ENCOUNTER — Other Ambulatory Visit: Payer: Self-pay

## 2021-10-09 MED ORDER — METOPROLOL TARTRATE 25 MG PO TABS: 25.0000 mg | ORAL_TABLET | Freq: Two times a day (BID) | ORAL | 1 refills | Status: DC

## 2021-10-09 NOTE — Telephone Encounter (Signed)
This is a [place] pt 

## 2021-10-09 NOTE — Telephone Encounter (Signed)
[place] pt. 

## 2021-10-14 ENCOUNTER — Other Ambulatory Visit: Payer: Self-pay | Admitting: Endocrinology

## 2021-11-18 ENCOUNTER — Telehealth: Payer: Self-pay | Admitting: Family Medicine

## 2021-11-18 DIAGNOSIS — E669 Obesity, unspecified: Secondary | ICD-10-CM | POA: Insufficient documentation

## 2021-11-18 DIAGNOSIS — E1169 Type 2 diabetes mellitus with other specified complication: Secondary | ICD-10-CM | POA: Insufficient documentation

## 2021-11-18 DIAGNOSIS — I152 Hypertension secondary to endocrine disorders: Secondary | ICD-10-CM

## 2021-11-18 NOTE — Telephone Encounter (Signed)
-----   Message from Alvina Chou sent at 11/11/2021 11:48 AM EDT ----- Regarding: Lab orders for Wednesday, 9.6.23 Patient is scheduled for CPX labs, please order future labs, Thanks , Camelia Eng

## 2021-11-19 ENCOUNTER — Other Ambulatory Visit: Payer: 59

## 2021-11-26 ENCOUNTER — Encounter: Payer: 59 | Admitting: Family Medicine

## 2021-12-02 ENCOUNTER — Other Ambulatory Visit (INDEPENDENT_AMBULATORY_CARE_PROVIDER_SITE_OTHER): Payer: 59

## 2021-12-02 DIAGNOSIS — I152 Hypertension secondary to endocrine disorders: Secondary | ICD-10-CM

## 2021-12-02 DIAGNOSIS — E785 Hyperlipidemia, unspecified: Secondary | ICD-10-CM

## 2021-12-02 DIAGNOSIS — E1169 Type 2 diabetes mellitus with other specified complication: Secondary | ICD-10-CM

## 2021-12-02 DIAGNOSIS — E1159 Type 2 diabetes mellitus with other circulatory complications: Secondary | ICD-10-CM | POA: Diagnosis not present

## 2021-12-02 LAB — COMPREHENSIVE METABOLIC PANEL WITH GFR
ALT: 8 U/L (ref 0–53)
AST: 11 U/L (ref 0–37)
Albumin: 3.9 g/dL (ref 3.5–5.2)
Alkaline Phosphatase: 95 U/L (ref 39–117)
BUN: 12 mg/dL (ref 6–23)
CO2: 30 meq/L (ref 19–32)
Calcium: 9 mg/dL (ref 8.4–10.5)
Chloride: 102 meq/L (ref 96–112)
Creatinine, Ser: 1 mg/dL (ref 0.40–1.50)
GFR: 90.32 mL/min
Glucose, Bld: 119 mg/dL — ABNORMAL HIGH (ref 70–99)
Potassium: 4.8 meq/L (ref 3.5–5.1)
Sodium: 139 meq/L (ref 135–145)
Total Bilirubin: 0.7 mg/dL (ref 0.2–1.2)
Total Protein: 6.4 g/dL (ref 6.0–8.3)

## 2021-12-02 LAB — CBC WITH DIFFERENTIAL/PLATELET
Basophils Absolute: 0 10*3/uL (ref 0.0–0.1)
Basophils Relative: 0.7 % (ref 0.0–3.0)
Eosinophils Absolute: 0.4 10*3/uL (ref 0.0–0.7)
Eosinophils Relative: 5.4 % — ABNORMAL HIGH (ref 0.0–5.0)
HCT: 43.6 % (ref 39.0–52.0)
Hemoglobin: 14.9 g/dL (ref 13.0–17.0)
Lymphocytes Relative: 19.9 % (ref 12.0–46.0)
Lymphs Abs: 1.4 10*3/uL (ref 0.7–4.0)
MCHC: 34.3 g/dL (ref 30.0–36.0)
MCV: 86 fl (ref 78.0–100.0)
Monocytes Absolute: 0.4 10*3/uL (ref 0.1–1.0)
Monocytes Relative: 5.9 % (ref 3.0–12.0)
Neutro Abs: 4.9 10*3/uL (ref 1.4–7.7)
Neutrophils Relative %: 68.1 % (ref 43.0–77.0)
Platelets: 215 10*3/uL (ref 150.0–400.0)
RBC: 5.06 Mil/uL (ref 4.22–5.81)
RDW: 13.5 % (ref 11.5–15.5)
WBC: 7.2 10*3/uL (ref 4.0–10.5)

## 2021-12-02 LAB — LIPID PANEL
Cholesterol: 92 mg/dL (ref 0–200)
HDL: 27.7 mg/dL — ABNORMAL LOW (ref >39.00)
LDL Cholesterol: 40 mg/dL (ref 0–99)
NonHDL: 64.65
Total CHOL/HDL Ratio: 3
Triglycerides: 123 mg/dL (ref 0.0–149.0)
VLDL: 24.6 mg/dL (ref 0.0–40.0)

## 2021-12-02 LAB — TSH: TSH: 1.83 u[IU]/mL (ref 0.35–5.50)

## 2021-12-09 ENCOUNTER — Ambulatory Visit (INDEPENDENT_AMBULATORY_CARE_PROVIDER_SITE_OTHER): Payer: 59 | Admitting: Family Medicine

## 2021-12-09 ENCOUNTER — Encounter: Payer: Self-pay | Admitting: Family Medicine

## 2021-12-09 VITALS — BP 128/82 | HR 93 | Temp 98.0°F | Ht 72.25 in | Wt 259.2 lb

## 2021-12-09 DIAGNOSIS — E785 Hyperlipidemia, unspecified: Secondary | ICD-10-CM

## 2021-12-09 DIAGNOSIS — I2510 Atherosclerotic heart disease of native coronary artery without angina pectoris: Secondary | ICD-10-CM

## 2021-12-09 DIAGNOSIS — Z6834 Body mass index (BMI) 34.0-34.9, adult: Secondary | ICD-10-CM

## 2021-12-09 DIAGNOSIS — E66811 Other obesity due to excess calories: Secondary | ICD-10-CM

## 2021-12-09 DIAGNOSIS — E1159 Type 2 diabetes mellitus with other circulatory complications: Secondary | ICD-10-CM

## 2021-12-09 DIAGNOSIS — F902 Attention-deficit hyperactivity disorder, combined type: Secondary | ICD-10-CM

## 2021-12-09 DIAGNOSIS — Z0000 Encounter for general adult medical examination without abnormal findings: Secondary | ICD-10-CM | POA: Diagnosis not present

## 2021-12-09 DIAGNOSIS — E119 Type 2 diabetes mellitus without complications: Secondary | ICD-10-CM

## 2021-12-09 DIAGNOSIS — E669 Obesity, unspecified: Secondary | ICD-10-CM

## 2021-12-09 DIAGNOSIS — Z1211 Encounter for screening for malignant neoplasm of colon: Secondary | ICD-10-CM

## 2021-12-09 DIAGNOSIS — E6609 Other obesity due to excess calories: Secondary | ICD-10-CM | POA: Diagnosis not present

## 2021-12-09 DIAGNOSIS — E1169 Type 2 diabetes mellitus with other specified complication: Secondary | ICD-10-CM | POA: Diagnosis not present

## 2021-12-09 DIAGNOSIS — I152 Hypertension secondary to endocrine disorders: Secondary | ICD-10-CM

## 2021-12-09 MED ORDER — AMPHETAMINE-DEXTROAMPHETAMINE 20 MG PO TABS: 20.0000 mg | ORAL_TABLET | Freq: Two times a day (BID) | ORAL | 0 refills | Status: DC

## 2021-12-09 NOTE — Assessment & Plan Note (Signed)
Under care of cardiology  Clinically stable Asa , metoprolol and statin

## 2021-12-09 NOTE — Progress Notes (Signed)
Subjective:    Patient ID: Todd Gaines, male    DOB: May 25, 1975, 46 y.o.   MRN: [id number]  HPI Here for health maintenance exam and to review chronic medical problems    Wt Readings from Last 3 Encounters:  12/09/21 259 lb 3.2 oz (117.6 kg)  09/09/21 261 lb (118.4 kg)  05/08/21 265 lb 9.6 oz (120.5 kg)   34.91 kg/m  Doing ok overall  Has lost some weight   Gained a little from low 250s eating worse for a short time  Is ready to get back on track It is a hard time of year for diet  Trulicity helped   Working /very busy    Immunization History  Administered Date(s) Administered   Influenza,inj,Quad PF,6+ Mos 04/18/2014, 01/10/2015, 12/06/2017, 02/13/2020, 12/31/2020   Influenza-Unspecified 11/29/2015   Moderna Sars-Covid-2 Vaccination 05/26/2019, 06/26/2019   Pneumococcal Polysaccharide-23 09/28/2014   Tdap 04/08/2016   Health Maintenance Due  Topic Date Due   Hepatitis C Screening  Never done   COLONOSCOPY (Pts 45-59yrs Insurance coverage will need to be confirmed)  Never done   OPHTHALMOLOGY EXAM  11/09/2020   Diabetic kidney evaluation - Urine ACR  12/27/2021   Wants to get flu shot in 2 wk at the endo  office /Dr Dwyane Dee   Colon cancer screening  No family history   Eye exam: last week /was ok   Prostate health :  no problems    Had testing for ADHD in 2021 and tested positive  Did some lifestyle changes to help  Organization primarily  A lot of trouble staying on task   Has never been on medicine  Is interested in stimulant medicine   HTN in setting of CAD (doing very well with that)   bp is stable today  No cp or palpitations or headaches or edema  No side effects to medicines  BP Readings from Last 3 Encounters:  12/09/21 128/82  09/09/21 122/82  05/08/21 124/82     Metoprolol 25 mg bid  Asa 81 mg daily   Lab Results  Component Value Date   CREATININE 1.00 12/02/2021   BUN 12 12/02/2021   NA 139 12/02/2021   K 4.8 12/02/2021   CL  102 12/02/2021   CO2 30 12/02/2021   Gfr of 90.3  Lab Results  Component Value Date   WBC 7.2 12/02/2021   HGB 14.9 12/02/2021   HCT 43.6 12/02/2021   MCV 86.0 12/02/2021   PLT 215.0 12/02/2021   Lab Results  Component Value Date   TSH 1.83 12/02/2021     Pulse Readings from Last 3 Encounters:  12/09/21 93  09/09/21 89  05/08/21 96    DM2 Under endocrinology care   Last a1c was down under 7 Lab Results  Component Value Date   HGBA1C 6.9 (H) 09/01/2021   Dr Dwyane Dee   He does not check his blood sugar Knows when it feels low or feels off (not lately)  Some exercise with work and helping travel ball (several levels) Would like to do a little more    Trulicity  4.5 mg weekly  Metformin xr 2000 mg daily  Jardiance 25 mg daily  Stelara   Hyperlipidemia Lab Results  Component Value Date   CHOL 92 12/02/2021   CHOL 93 05/06/2021   CHOL 88 05/08/2020   Lab Results  Component Value Date   HDL 27.70 (L) 12/02/2021   HDL 27.20 (L) 05/06/2021   HDL 30.70 (L) 05/08/2020  Lab Results  Component Value Date   LDLCALC 40 12/02/2021   LDLCALC 46 05/06/2021   LDLCALC 37 05/08/2020   Lab Results  Component Value Date   TRIG 123.0 12/02/2021   TRIG 95.0 05/06/2021   TRIG 104.0 05/08/2020   Lab Results  Component Value Date   CHOLHDL 3 12/02/2021   CHOLHDL 3 05/06/2021   CHOLHDL 3 05/08/2020   Lab Results  Component Value Date   LDLDIRECT 90.0 06/27/2019   LDLDIRECT 89.0 06/23/2017   LDLDIRECT 77.0 11/05/2015   Atorvastatin 80 mg daily  Good control    Patient Active Problem List   Diagnosis Date Noted   Colon cancer screening 12/09/2021   Diabetes mellitus type 2 in obese ([place]) 11/18/2021   Hypertension associated with diabetes ([place]) 08/16/2020   Class 1 obesity due to excess calories with serious comorbidity and body mass index (BMI) of 34.0 to 34.9 in adult 04/17/2020   Coronary artery disease involving native coronary artery of native heart  without angina pectoris 04/17/2020   Routine general medical examination at a health care facility 01/26/2018   Screening for HIV without presence of risk factors 06/23/2017   ADHD (attention deficit hyperactivity disorder) 04/08/2016   Hyperlipidemia associated with type 2 diabetes mellitus ([place]) 04/18/2014   Psoriasis 04/18/2014   Past Medical History:  Diagnosis Date   CAD (coronary artery disease)    Diabetes mellitus without complication ([place])    Elevated cholesterol    Psoriasis    Past Surgical History:  Procedure Laterality Date   CORONARY STENT INTERVENTION N/A 09/08/2019   Procedure: CORONARY STENT INTERVENTION;  Surgeon: Nelva Bush, MD;  Location: [facility];  Service: Cardiovascular;  Laterality: N/A;   LEFT HEART CATH AND CORONARY ANGIOGRAPHY N/A 09/08/2019   Procedure: LEFT HEART CATH AND CORONARY ANGIOGRAPHY;  Surgeon: Nelva Bush, MD;  Location: [facility];  Service: Cardiovascular;  Laterality: N/A;   Social History   Tobacco Use   Smoking status: Former    Packs/day: 0.25    Years: 10.00    Total pack years: 2.50    Types: Cigarettes    Quit date: 04/17/2002    Years since quitting: 19.6   Smokeless tobacco: Never  Vaping Use   Vaping Use: Never used  Substance Use Topics   Alcohol use: No    Alcohol/week: 0.0 standard drinks of alcohol   Drug use: No   Family History  Problem Relation Age of Onset   Diabetes Mother    Diabetes Paternal Grandfather    Hypertension Paternal Grandfather    Heart disease Neg Hx    Cancer Neg Hx    No Known Allergies Current Outpatient Medications on File Prior to Visit  Medication Sig Dispense Refill   aspirin EC 81 MG tablet Take 81 mg by mouth daily. Swallow whole.     atorvastatin (LIPITOR) 80 MG tablet Take 1 tablet (80 mg total) by mouth daily. 90 tablet 3   JARDIANCE 25 MG TABS tablet TAKE ONE TABLET BY MOUTH DAILY BEFORE BREAKFAST. START WHEN FINISHED WITH INVOKANA. 30 tablet 5    metFORMIN (GLUCOPHAGE-XR) 500 MG 24 hr tablet TAKE FOUR TABLETS BY MOUTH DAILY 120 tablet 3   metoprolol tartrate (LOPRESSOR) 25 MG tablet Take 1 tablet (25 mg total) by mouth 2 (two) times daily. 180 tablet 1   STELARA 90 MG/ML SOSY injection Inject 90 mg into the skin once. Every 3 months.     TRULICITY 4.5 EX/5.2WU SOPN INJECT 4.5  MG UNDER THE SKIN ONCE WEEKLY 2 mL 2   glucose blood test strip Use OneTouch Ultra test strips as instructed to check blood sugar 4 times daily. DX:E11.65 (Patient not taking: Reported on 12/09/2021) 150 each 2   Insulin Pen Needle (NOVOTWIST) 32G X 5 MM MISC Use one per day to inject victoza (Patient not taking: Reported on 12/09/2021) 50 each 3   nitroGLYCERIN (NITROSTAT) 0.4 MG SL tablet Place 1 tablet (0.4 mg total) under the tongue every 5 (five) minutes as needed for chest pain. (Patient not taking: Reported on 05/08/2021) 20 tablet 12   No current facility-administered medications on file prior to visit.     Review of Systems  Constitutional:  Negative for activity change, appetite change, fatigue, fever and unexpected weight change.  HENT:  Negative for congestion, rhinorrhea, sore throat and trouble swallowing.   Eyes:  Negative for pain, redness, itching and visual disturbance.  Respiratory:  Negative for cough, chest tightness, shortness of breath and wheezing.   Cardiovascular:  Negative for chest pain and palpitations.  Gastrointestinal:  Negative for abdominal pain, blood in stool, constipation, diarrhea and nausea.  Endocrine: Negative for cold intolerance, heat intolerance, polydipsia and polyuria.  Genitourinary:  Negative for difficulty urinating, dysuria, frequency and urgency.  Musculoskeletal:  Negative for arthralgias, joint swelling and myalgias.  Skin:  Negative for pallor and rash.  Neurological:  Negative for dizziness, tremors, weakness, numbness and headaches.  Hematological:  Negative for adenopathy. Does not bruise/bleed easily.   Psychiatric/Behavioral:  Negative for decreased concentration and dysphoric mood. The patient is not nervous/anxious.        Objective:   Physical Exam Constitutional:      General: He is not in acute distress.    Appearance: Normal appearance. He is well-developed. He is obese. He is not ill-appearing or diaphoretic.  HENT:     Head: Normocephalic and atraumatic.     Right Ear: Tympanic membrane, ear canal and external ear normal.     Left Ear: Tympanic membrane, ear canal and external ear normal.     Nose: Nose normal. No congestion.     Mouth/Throat:     Mouth: Mucous membranes are moist.     Pharynx: Oropharynx is clear. No posterior oropharyngeal erythema.  Eyes:     General: No scleral icterus.       Right eye: No discharge.        Left eye: No discharge.     Conjunctiva/sclera: Conjunctivae normal.     Pupils: Pupils are equal, round, and reactive to light.  Neck:     Thyroid: No thyromegaly.     Vascular: No carotid bruit or JVD.  Cardiovascular:     Rate and Rhythm: Normal rate and regular rhythm.     Pulses: Normal pulses.     Heart sounds: Normal heart sounds.     No gallop.  Pulmonary:     Effort: Pulmonary effort is normal. No respiratory distress.     Breath sounds: Normal breath sounds. No wheezing or rales.     Comments: Good air exch Chest:     Chest wall: No tenderness.  Abdominal:     General: Bowel sounds are normal. There is no distension or abdominal bruit.     Palpations: Abdomen is soft. There is no mass.     Tenderness: There is no abdominal tenderness.     Hernia: No hernia is present.  Musculoskeletal:        General: No tenderness.  Cervical back: Normal range of motion and neck supple. No rigidity. No muscular tenderness.     Right lower leg: No edema.     Left lower leg: No edema.  Lymphadenopathy:     Cervical: No cervical adenopathy.  Skin:    General: Skin is warm and dry.     Coloration: Skin is not pale.     Findings: No  erythema or rash.     Comments: Tanned  Solar lentigines diffusely   Neurological:     Mental Status: He is alert.     Cranial Nerves: No cranial nerve deficit.     Motor: No abnormal muscle tone.     Coordination: Coordination normal.     Gait: Gait normal.     Deep Tendon Reflexes: Reflexes are normal and symmetric. Reflexes normal.  Psychiatric:        Mood and Affect: Mood normal.        Cognition and Memory: Cognition normal.           Assessment & Plan:   Problem List Items Addressed This Visit       Cardiovascular and Mediastinum   Coronary artery disease involving native coronary artery of native heart without angina pectoris    Under care of cardiology  Clinically stable Asa , metoprolol and statin       Hypertension associated with diabetes ([place])    bp in fair control at this time  BP Readings from Last 1 Encounters:  12/09/21 128/82  No changes needed Most recent labs reviewed  Disc lifstyle change with low sodium diet and exercise  Doing well with metoprolol 25 mg bid  Enc him to get back on better diet         Endocrine   Diabetes mellitus type 2 in obese [facility])    Doing fairly well Lab Results  Component Value Date   HGBA1C 6.9 (H) 09/01/2021   Under endocrinology care Has lost wt but needs to get back on track with diet  Continues trulicity 4.5 mg weekly  jardiance 25 mg  Metformin xr 2000 mg  Disc plan for exercise       Hyperlipidemia associated with type 2 diabetes mellitus ([place])    Disc goals for lipids and reasons to control them Rev last labs with pt Rev low sat fat diet in detail LDL is very well controlled in setting of CAD at 30  Plan to continue atorvastatin 80 mg daily  Enc exercise to help raise low HDL        Other   ADHD (attention deficit hyperactivity disorder)    Reviewed results/assessment from Dr Lurline Hare in 2021 ADHD combined  Has a family history  Open to tx  Disc adderall and poss side eff in detal  Will  try 20 mg bid and f/u in 1 mo  inst to call if any problems  F/u 1 mo to make sure it does not elevate bp or pulse  Adjust if needed at that time  Enc to keep organized and use the tricks that were given to him Exercise would also be helpful        Class 1 obesity due to excess calories with serious comorbidity and body mass index (BMI) of 34.0 to 34.9 in adult    Discussed how this problem influences overall health and the risks it imposes  Reviewed plan for weight loss with lower calorie diet (via better food choices and also portion control or program like weight  watchers) and exercise building up to or more than 30 minutes 5 days per week including some aerobic activity   trulicity has helped so far  Encouraged more exercise         Relevant Medications   amphetamine-dextroamphetamine (ADDERALL) 20 MG tablet   Colon cancer screening    Discussed options  Pt does not want to schedule colonoscopy yet  cologuard ordered       Relevant Orders   Cologuard   Routine general medical examination at a health care facility - Primary    Reviewed health habits including diet and exercise and skin cancer prevention Reviewed appropriate screening tests for age  Also reviewed health mt list, fam hx and immunization status , as well as social and family history   See HPI Labs reviewed  Plans flu shot at endo office in 2 wk Eye exam last week/ no retinopathy- sent for the report No prostate problems cologuard kit ordered for colon cancer screening

## 2021-12-09 NOTE — Assessment & Plan Note (Signed)
Discussed options  Pt does not want to schedule colonoscopy yet  cologuard ordered

## 2021-12-09 NOTE — Assessment & Plan Note (Signed)
Disc goals for lipids and reasons to control them Rev last labs with pt Rev low sat fat diet in detail LDL is very well controlled in setting of CAD at 40  Plan to continue atorvastatin 80 mg daily  Enc exercise to help raise low HDL

## 2021-12-09 NOTE — Patient Instructions (Addendum)
I ordered the cologuard kit for colon cancer screening  If you don't hear from the company in 1-2 weeks let us know    Try adderall 20 mg twice daily for ADD Take it in am and at mid lunch/time  If any intolerable side effects let us know   Follow up in about month   Keep working on health diet and exercise  Get back on track with diet   Try to get most of your carbohydrates from produce (with the exception of white potatoes)  Eat less bread/pasta/rice/snack foods/cereals/sweets and other items from the middle of the grocery store (processed carbs)

## 2021-12-09 NOTE — Assessment & Plan Note (Signed)
bp in fair control at this time  BP Readings from Last 1 Encounters:  12/09/21 128/82   No changes needed Most recent labs reviewed  Disc lifstyle change with low sodium diet and exercise  Doing well with metoprolol 25 mg bid  Enc him to get back on better diet

## 2021-12-09 NOTE — Assessment & Plan Note (Signed)
Reviewed results/assessment from Dr Lurline Hare in 2021 ADHD combined  Has a family history  Open to tx  Disc adderall and poss side eff in detal  Will try 20 mg bid and f/u in 1 mo  inst to call if any problems  F/u 1 mo to make sure it does not elevate bp or pulse  Adjust if needed at that time  Enc to keep organized and use the tricks that were given to him Exercise would also be helpful

## 2021-12-09 NOTE — Assessment & Plan Note (Signed)
Reviewed health habits including diet and exercise and skin cancer prevention Reviewed appropriate screening tests for age  Also reviewed health mt list, fam hx and immunization status , as well as social and family history   See HPI Labs reviewed  Plans flu shot at endo office in 2 wk Eye exam last week/ no retinopathy- sent for the report No prostate problems cologuard kit ordered for colon cancer screening  

## 2021-12-09 NOTE — Assessment & Plan Note (Signed)
Doing fairly well Lab Results  Component Value Date   HGBA1C 6.9 (H) 09/01/2021   Under endocrinology care Has lost wt but needs to get back on track with diet  Continues trulicity 4.5 mg weekly  jardiance 25 mg  Metformin xr 2000 mg  Disc plan for exercise

## 2021-12-09 NOTE — Assessment & Plan Note (Addendum)
Discussed how this problem influences overall health and the risks it imposes  Reviewed plan for weight loss with lower calorie diet (via better food choices and also portion control or program like weight watchers) and exercise building up to or more than 30 minutes 5 days per week including some aerobic activity   trulicity has helped so far  Encouraged more exercise

## 2022-01-06 LAB — COLOGUARD: COLOGUARD: NEGATIVE

## 2022-01-07 ENCOUNTER — Encounter: Payer: Self-pay | Admitting: *Deleted

## 2022-01-07 ENCOUNTER — Other Ambulatory Visit (INDEPENDENT_AMBULATORY_CARE_PROVIDER_SITE_OTHER): Payer: 59

## 2022-01-07 DIAGNOSIS — E669 Obesity, unspecified: Secondary | ICD-10-CM

## 2022-01-07 DIAGNOSIS — E1169 Type 2 diabetes mellitus with other specified complication: Secondary | ICD-10-CM | POA: Diagnosis not present

## 2022-01-07 DIAGNOSIS — Z0189 Encounter for other specified special examinations: Secondary | ICD-10-CM

## 2022-01-07 DIAGNOSIS — I7121 Aneurysm of the ascending aorta, without rupture: Secondary | ICD-10-CM

## 2022-01-07 LAB — BASIC METABOLIC PANEL
BUN: 10 mg/dL (ref 6–23)
CO2: 30 mEq/L (ref 19–32)
Calcium: 9.1 mg/dL (ref 8.4–10.5)
Chloride: 103 mEq/L (ref 96–112)
Creatinine, Ser: 0.84 mg/dL (ref 0.40–1.50)
GFR: 104.58 mL/min (ref >60.00)
Glucose, Bld: 116 mg/dL — ABNORMAL HIGH (ref 70–99)
Potassium: 4.5 mEq/L (ref 3.5–5.1)
Sodium: 139 mEq/L (ref 135–145)

## 2022-01-07 LAB — MICROALBUMIN / CREATININE URINE RATIO
Creatinine,U: 84.7 mg/dL
Microalb Creat Ratio: 1.3 mg/g (ref 0.0–30.0)
Microalb, Ur: 1.1 mg/dL (ref 0.0–1.9)

## 2022-01-07 LAB — HEMOGLOBIN A1C: Hgb A1c MFr Bld: 6.9 % — ABNORMAL HIGH (ref 4.6–6.5)

## 2022-01-08 ENCOUNTER — Ambulatory Visit: Payer: 59 | Admitting: Family Medicine

## 2022-01-08 ENCOUNTER — Encounter: Payer: Self-pay | Admitting: Family Medicine

## 2022-01-08 VITALS — BP 128/70 | HR 92 | Temp 98.0°F | Ht 72.25 in | Wt 253.4 lb

## 2022-01-08 DIAGNOSIS — F902 Attention-deficit hyperactivity disorder, combined type: Secondary | ICD-10-CM

## 2022-01-08 MED ORDER — AMPHETAMINE-DEXTROAMPHETAMINE 20 MG PO TABS: 20.0000 mg | ORAL_TABLET | Freq: Two times a day (BID) | ORAL | 0 refills | Status: AC

## 2022-01-08 NOTE — Patient Instructions (Signed)
Continue your adderall as you are taking it  Stay organized  Add exercise when you can  Take care of yourself   If any problems or side effects let us know

## 2022-01-08 NOTE — Assessment & Plan Note (Signed)
Doing well with adderall 20 mg bid  Does reduce appetite -pt is careful to eat in light of diabetes  occ jittery if he is not busy later in the day  Does not want to change dose Will stick with it  Enc good self care, exercise and organization  inst to call if problems/side eff or not effective

## 2022-01-08 NOTE — Progress Notes (Signed)
Subjective:    Patient ID: Todd Gaines, male    DOB: [birth year], 46 y.o.   MRN: [id number]  HPI Pt presents to discuss ADHD  Wt Readings from Last 3 Encounters:  01/08/22 253 lb 6 oz (114.9 kg)  12/09/21 259 lb 3.2 oz (117.6 kg)  09/09/21 261 lb (118.4 kg)   34.13 kg/m  Was tested for ADHD in 2021  Worked on some lifestyle changes   Last visit started adderall 20 mg bid  Tolerating it fine so far  There are days he does not need it  Occ a little jittery after the 2nd dose (when not as busy)   It is helping the concentration /attention  Makes a difference  A little fidgety but -mind is wandering less   Has some caffeine in the am   It does reduce appetite so he eats before he takes it    BP Readings from Last 3 Encounters:  01/08/22 128/70  12/09/21 128/82  09/09/21 122/82   Pulse Readings from Last 3 Encounters:  01/08/22 92  12/09/21 93  09/09/21 89   Patient Active Problem List   Diagnosis Date Noted   Colon cancer screening 12/09/2021   Diabetes mellitus type 2 in obese ([facility]) 11/18/2021   Hypertension associated with diabetes ([facility]) 08/16/2020   Class 1 obesity due to excess calories with serious comorbidity and body mass index (BMI) of 34.0 to 34.9 in adult 04/17/2020   Coronary artery disease involving native coronary artery of native heart without angina pectoris 04/17/2020   Routine general medical examination at a health care facility 01/26/2018   ADHD (attention deficit hyperactivity disorder) 04/08/2016   Hyperlipidemia associated with type 2 diabetes mellitus ([facility]) 04/18/2014   Psoriasis 04/18/2014   Past Medical History:  Diagnosis Date   CAD (coronary artery disease)    Diabetes mellitus without complication ([facility])    Elevated cholesterol    Psoriasis    Past Surgical History:  Procedure Laterality Date   CORONARY STENT INTERVENTION N/A 09/08/2019   Procedure: CORONARY STENT INTERVENTION;  Surgeon: Yvonne Kendall, MD;  Location: [facility];  Service: Cardiovascular;  Laterality: N/A;   LEFT HEART CATH AND CORONARY ANGIOGRAPHY N/A 09/08/2019   Procedure: LEFT HEART CATH AND CORONARY ANGIOGRAPHY;  Surgeon: Yvonne Kendall, MD;  Location: [facility];  Service: Cardiovascular;  Laterality: N/A;   Social History   Tobacco Use   Smoking status: Former    Packs/day: 0.25    Years: 10.00    Total pack years: 2.50    Types: Cigarettes    Quit date: 04/17/2002    Years since quitting: 19.7   Smokeless tobacco: Never  Vaping Use   Vaping Use: Never used  Substance Use Topics   Alcohol use: No    Alcohol/week: 0.0 standard drinks of alcohol   Drug use: No   Family History  Problem Relation Age of Onset   Diabetes Mother    Diabetes Paternal Grandfather    Hypertension Paternal Grandfather    Heart disease Neg Hx    Cancer Neg Hx    No Known Allergies Current Outpatient Medications on File Prior to Visit  Medication Sig Dispense Refill   aspirin EC 81 MG tablet Take 81 mg by mouth daily. Swallow whole.     atorvastatin (LIPITOR) 80 MG tablet Take 1 tablet (80 mg total) by mouth daily. 90 tablet 3   glucose blood test strip Use OneTouch Ultra test strips as instructed to  check blood sugar 4 times daily. DX:E11.65 150 each 2   Insulin Pen Needle (NOVOTWIST) 32G X 5 MM MISC Use one per day to inject victoza 50 each 3   JARDIANCE 25 MG TABS tablet TAKE ONE TABLET BY MOUTH DAILY BEFORE BREAKFAST. START WHEN FINISHED WITH INVOKANA. 30 tablet 5   metFORMIN (GLUCOPHAGE-XR) 500 MG 24 hr tablet TAKE FOUR TABLETS BY MOUTH DAILY 120 tablet 3   metoprolol tartrate (LOPRESSOR) 25 MG tablet Take 1 tablet (25 mg total) by mouth 2 (two) times daily. 180 tablet 1   nitroGLYCERIN (NITROSTAT) 0.4 MG SL tablet Place 1 tablet (0.4 mg total) under the tongue every 5 (five) minutes as needed for chest pain. 20 tablet 12   STELARA 90 MG/ML SOSY injection Inject 90 mg into the skin once. Every 3 months.     TRULICITY 4.5  MG/0.5ML SOPN INJECT 4.5 MG UNDER THE SKIN ONCE WEEKLY 2 mL 2   No current facility-administered medications on file prior to visit.    Review of Systems  Constitutional:  Negative for activity change, appetite change, fatigue, fever and unexpected weight change.  HENT:  Negative for congestion, rhinorrhea, sore throat and trouble swallowing.   Eyes:  Negative for pain, redness, itching and visual disturbance.  Respiratory:  Negative for cough, chest tightness, shortness of breath and wheezing.   Cardiovascular:  Negative for chest pain and palpitations.  Gastrointestinal:  Negative for abdominal pain, blood in stool, constipation, diarrhea and nausea.  Endocrine: Negative for cold intolerance, heat intolerance, polydipsia and polyuria.  Genitourinary:  Negative for difficulty urinating, dysuria, frequency and urgency.  Musculoskeletal:  Negative for arthralgias, joint swelling and myalgias.  Skin:  Negative for pallor and rash.  Neurological:  Negative for dizziness, tremors, weakness, numbness and headaches.  Hematological:  Negative for adenopathy. Does not bruise/bleed easily.  Psychiatric/Behavioral:  Negative for decreased concentration and dysphoric mood. The patient is not nervous/anxious.        Objective:   Physical Exam Constitutional:      General: He is not in acute distress.    Appearance: Normal appearance. He is well-developed. He is obese. He is not ill-appearing or diaphoretic.  HENT:     Head: Normocephalic and atraumatic.  Eyes:     Conjunctiva/sclera: Conjunctivae normal.     Pupils: Pupils are equal, round, and reactive to light.  Neck:     Thyroid: No thyromegaly.     Vascular: No carotid bruit or JVD.  Cardiovascular:     Rate and Rhythm: Normal rate and regular rhythm.     Heart sounds: Normal heart sounds.     No gallop.  Pulmonary:     Effort: Pulmonary effort is normal. No respiratory distress.     Breath sounds: Normal breath sounds. No wheezing or  rales.  Abdominal:     General: There is no distension or abdominal bruit.     Palpations: Abdomen is soft.  Musculoskeletal:     Cervical back: Normal range of motion and neck supple.     Right lower leg: No edema.     Left lower leg: No edema.  Lymphadenopathy:     Cervical: No cervical adenopathy.  Skin:    General: Skin is warm and dry.     Coloration: Skin is not pale.     Findings: No rash.  Neurological:     Mental Status: He is alert.     Motor: No tremor.     Coordination: Coordination normal.  Deep Tendon Reflexes: Reflexes are normal and symmetric. Reflexes normal.  Psychiatric:        Mood and Affect: Mood normal.     Comments: Pleasant  Talkative Attentive            Assessment & Plan:   Problem List Items Addressed This Visit       Other   ADHD (attention deficit hyperactivity disorder) - Primary    Doing well with adderall 20 mg bid  Does reduce appetite -pt is careful to eat in light of diabetes  occ jittery if he is not busy later in the day  Does not want to change dose Will stick with it  Enc good self care, exercise and organization  inst to call if problems/side eff or not effective

## 2022-01-12 ENCOUNTER — Ambulatory Visit: Payer: 59 | Admitting: Endocrinology

## 2022-01-13 ENCOUNTER — Other Ambulatory Visit: Payer: Self-pay | Admitting: Endocrinology

## 2022-02-14 ENCOUNTER — Other Ambulatory Visit: Payer: Self-pay | Admitting: Endocrinology

## 2022-03-21 ENCOUNTER — Other Ambulatory Visit: Payer: Self-pay | Admitting: Endocrinology

## 2022-03-26 ENCOUNTER — Other Ambulatory Visit: Payer: Self-pay | Admitting: Endocrinology

## 2022-04-15 ENCOUNTER — Encounter: Payer: Self-pay | Admitting: Internal Medicine

## 2022-04-15 ENCOUNTER — Ambulatory Visit: Payer: 59 | Attending: Internal Medicine | Admitting: Internal Medicine

## 2022-04-15 VITALS — BP 110/68 | HR 85 | Ht 73.0 in | Wt 265.2 lb

## 2022-04-15 DIAGNOSIS — E1159 Type 2 diabetes mellitus with other circulatory complications: Secondary | ICD-10-CM | POA: Diagnosis not present

## 2022-04-15 DIAGNOSIS — E785 Hyperlipidemia, unspecified: Secondary | ICD-10-CM

## 2022-04-15 DIAGNOSIS — I2510 Atherosclerotic heart disease of native coronary artery without angina pectoris: Secondary | ICD-10-CM | POA: Diagnosis not present

## 2022-04-15 DIAGNOSIS — E1169 Type 2 diabetes mellitus with other specified complication: Secondary | ICD-10-CM | POA: Diagnosis not present

## 2022-04-15 DIAGNOSIS — I152 Hypertension secondary to endocrine disorders: Secondary | ICD-10-CM

## 2022-04-15 NOTE — Patient Instructions (Signed)
Medication Instructions:  Your physician recommends that you continue on your current medications as directed. Please refer to the Current Medication list given to you today.  *If you need a refill on your cardiac medications before your next appointment, please call your pharmacy*   Lab Work: NONE If you have labs (blood work) drawn today and your tests are completely normal, you will receive your results only by: MyChart Message (if you have MyChart) OR A paper copy in the mail If you have any lab test that is abnormal or we need to change your treatment, we will call you to review the results.   Testing/Procedures: NONE   Follow-Up: At [facility], you and your health needs are our priority.  As part of our continuing mission to provide you with exceptional heart care, we have created designated Provider Care Teams.  These Care Teams include your primary Cardiologist (physician) and Advanced Practice Providers (APPs -  Physician Assistants and Nurse Practitioners) who all work together to provide you with the care you need, when you need it.     Your next appointment:   1 year(s)  Provider:   Mahesh A Chandrasekhar, MD     

## 2022-04-15 NOTE — Progress Notes (Signed)
Cardiology Office Note:    Date:  04/15/2022   ID:  Todd Gaines, DOB 1975-08-18, MRN [id number]  Pronounced: Per' mar  PCP:  Abner Greenspan, MD  [facility] Cardiologist:  Werner Lean, MD  [place] Electrophysiologist:  None   CC:  Follow up CAD  History of Present Illness:    Todd Gaines is a 47 y.o. male with a hx of CAD (NSTEMI 6/21), DM with HTN, DM with HLD, Mild thoracic aortic aneurysm who presents for evaluation 04/17/20 (transition from Dr. Saunders Revel to [facility] location). 2021:  Had been seen in follow up 12/25/2019 with resolution of chest pain.  Initial angina equivalent is chest heaviness.  2022: Non cardiac surgery was scheduled for 09/23/20.  2023  Aorta looked good at last eval   Patient notes that he is doing ok.   Since last visit notes that he cannot really pitch any more . There are no interval hospital/ED visit.   Daugther is looking at colleges.  No chest pain or pressure .  No SOB/DOE and no PND/Orthopnea.  No weight gain or leg swelling.  No palpitations or syncope .  Ambulatory blood pressure not done.    Past Medical History:  Diagnosis Date   CAD (coronary artery disease)    Diabetes mellitus without complication ([place])    Elevated cholesterol    Psoriasis     Past Surgical History:  Procedure Laterality Date   CORONARY STENT INTERVENTION N/A 09/08/2019   Procedure: CORONARY STENT INTERVENTION;  Surgeon: Nelva Bush, MD;  Location: [facility];  Service: Cardiovascular;  Laterality: N/A;   LEFT HEART CATH AND CORONARY ANGIOGRAPHY N/A 09/08/2019   Procedure: LEFT HEART CATH AND CORONARY ANGIOGRAPHY;  Surgeon: Nelva Bush, MD;  Location: [facility];  Service: Cardiovascular;  Laterality: N/A;    Current Medications: Current Meds  Medication Sig   amphetamine-dextroamphetamine (ADDERALL) 20 MG tablet Take 1 tablet (20 mg total) by mouth 2 (two) times daily.   aspirin EC 81 MG tablet Take 81 mg by  mouth daily. Swallow whole.   atorvastatin (LIPITOR) 80 MG tablet Take 1 tablet (80 mg total) by mouth daily.   clobetasol (TEMOVATE) 0.05 % external solution Apply topically as needed (psoriasis).   glucose blood test strip Use OneTouch Ultra test strips as instructed to check blood sugar 4 times daily. DX:E11.65   Insulin Pen Needle (NOVOTWIST) 32G X 5 MM MISC Use one per day to inject victoza   JARDIANCE 25 MG TABS tablet TAKE ONE TABLET BY MOUTH DAILY BEFORE BREAKFAST. START WHEN FINISHED WITH INVOKANA.   metFORMIN (GLUCOPHAGE-XR) 500 MG 24 hr tablet TAKE FOUR TABLETS BY MOUTH DAILY   metoprolol tartrate (LOPRESSOR) 25 MG tablet Take 1 tablet (25 mg total) by mouth 2 (two) times daily.   nitroGLYCERIN (NITROSTAT) 0.4 MG SL tablet Place 1 tablet (0.4 mg total) under the tongue every 5 (five) minutes as needed for chest pain.   STELARA 90 MG/ML SOSY injection Inject 90 mg into the skin once. Every 3 months.   TRULICITY 4.5 DG/3.8VF SOPN INJECT 4.5 MG UNDER THE SKIN ONCE WEEKLY     Allergies:   Patient has no known allergies.   Social History   Socioeconomic History   Marital status: Married    Spouse name: Not on file   Number of children: Not on file   Years of education: Not on file   Highest education level: Not on file  Occupational History  Not on file  Tobacco Use   Smoking status: Former    Packs/day: 0.25    Years: 10.00    Total pack years: 2.50    Types: Cigarettes    Quit date: 04/17/2002    Years since quitting: 20.0   Smokeless tobacco: Never  Vaping Use   Vaping Use: Never used  Substance and Sexual Activity   Alcohol use: No    Alcohol/week: 0.0 standard drinks of alcohol   Drug use: No   Sexual activity: Yes  Other Topics Concern   Not on file  Social History Narrative   Not on file   Social Determinants of Health   Financial Resource Strain: Not on file  Food Insecurity: Not on file  Transportation Needs: Not on file  Physical Activity: Not on file   Stress: Not on file  Social Connections: Not on file    Social: [employer], Daughter is on the team, Looking at [employer]   Family History: The patient's family history includes Diabetes in his mother and paternal grandfather; Hypertension in his paternal grandfather. There is no history of Heart disease or Cancer.  History of coronary artery disease notable for no members. History of heart failure notable for no members. No history of cardiomyopathies including hypertrophic cardiomyopathy, left ventricular non-compaction, or arrhythmogenic right ventricular cardiomyopathy. History of arrhythmia notable for no members. Grandfather died of sudden cardiac death at the age of 67 No history of bicuspid aortic valve or aortic aneurysm or dissection.  ROS:   Please see the history of present illness.     All other systems reviewed and are negative.  EKGs/Labs/Other Studies Reviewed:    The following studies were reviewed today:  EKG:   03/21/21 SR rate88 Q wave in III 01/05/20: SR 66 inferior TWI  Cardiac Studies & Procedures   CARDIAC CATHETERIZATION  CARDIAC CATHETERIZATION 09/08/2019  Narrative Conclusions: 1. Two-vessel coronary artery disease with 60% distal LCx stenosis and thrombotic occlusion of the distal RCA. 2. Mildly elevated left ventricular filling pressure (LVEDP ~20 mmHg). 3. Successful PCI to distal RCA using resolute Onyx 3.0 x 38 mm drug-eluting stent with 0% residual stenosis and TIMI-3 flow.  Recommendations: 1. Dual antiplatelet therapy with aspirin and prasugrel for 12 months. 2. Aggressive secondary prevention. 3. Favor medical therapy of moderate distal LCx disease.  If Mr. Milanes has recurrent chest pain, functional assessment of this lesion and PCI, if hemodynamically significant, will need to be considered.  Yvonne Kendall, MD [facility]  Findings Coronary Findings Diagnostic  Dominance: Right  Left Main Vessel is large. Vessel  is angiographically normal.  Left Anterior Descending Vessel is large.  First Diagonal Branch Vessel is moderate in size.  Second Diagonal Branch Vessel is small in size.  Third Diagonal Branch Vessel is moderate in size.  Left Circumflex Vessel is large. Dist Cx lesion is 60% stenosed. The lesion is focal and eccentric.  First Obtuse Marginal Branch Vessel is small in size.  Second Obtuse Marginal Branch Vessel is small in size.  Third Obtuse Marginal Branch Vessel is small in size.  Fourth Obtuse Marginal Branch Vessel is large in size.  Right Coronary Artery Vessel is large. Dist RCA lesion is 100% stenosed. The lesion is thrombotic.  Right Posterior Descending Artery Vessel is moderate in size.  Right Posterior Atrioventricular Artery Vessel is moderate in size.  Intervention  Dist RCA lesion Stent Lesion length:  36 mm. CATH LAUNCHER [id number] guide catheter was inserted. Lesion crossed  with guidewire using a WIRE RUNTHROUGH .[id number]. Pre-stent angioplasty was performed using a BALLOON TREK RX [id number]. Maximum pressure:  12 atm. A drug-eluting stent was successfully placed using a STENT RESOLUTE [id number]. Maximum pressure: 16 atm. Stent strut is well apposed. Post-stent angioplasty was performed using a BALLOON [place] TREK RX [id number]. Maximum pressure:  16 atm. Post-Intervention Lesion Assessment The intervention was successful. Pre-interventional TIMI flow is 0. Post-intervention TIMI flow is 3. No complications occurred at this lesion. There is a 0% residual stenosis post intervention.     ECHOCARDIOGRAM  ECHOCARDIOGRAM COMPLETE 07/29/2020  Narrative ECHOCARDIOGRAM REPORT    Patient Name:   Todd Gaines [facility] Date of Exam: 07/29/2020 Medical Rec #:  [id number]       Height:       73.0 in Accession #:    [id number]      Weight:       270.0 lb Date of Birth:  [birth year]       BSA:          2.444 m Patient Age:    22 years        BP:           124/68  mmHg Patient Gender: M               HR:           86 bpm. Exam Location:  [place]  Procedure: 2D Echo, Cardiac Doppler, Color Doppler and Strain Analysis  Indications:    I25.1 CAD  History:        Patient has prior history of Echocardiogram examinations, most recent 09/08/2019. Previous Myocardial Infarction and CAD; Risk Factors:Hypertension, Diabetes, Dyslipidemia and Former Smoker. Thoracic aortic aneurysm. Morbid obesity.  Sonographer:    Jessee Avers, RDCS Referring Phys: [id number] [facility]hadji [facility]  IMPRESSIONS   1. Left ventricular ejection fraction, by estimation, is 60 to 65%. The left ventricle has normal function. The left ventricle has no regional wall motion abnormalities. There is mild left ventricular hypertrophy. Left ventricular diastolic parameters were normal. The average left ventricular global longitudinal strain is -23.0 %. The global longitudinal strain is normal. 2. Right ventricular systolic function is normal. The right ventricular size is normal. 3. The mitral valve is normal in structure. No evidence of mitral valve regurgitation. No evidence of mitral stenosis. 4. The aortic valve is normal in structure. Aortic valve regurgitation is not visualized. No aortic stenosis is present. 5. The inferior vena cava is normal in size with greater than 50% respiratory variability, suggesting right atrial pressure of 3 mmHg.  Comparison(s): 09/08/19 EF 55-60%.  FINDINGS Left Ventricle: Left ventricular ejection fraction, by estimation, is 60 to 65%. The left ventricle has normal function. The left ventricle has no regional wall motion abnormalities. The average left ventricular global longitudinal strain is -23.0 %. The global longitudinal strain is normal. The left ventricular internal cavity size was normal in size. There is mild left ventricular hypertrophy. Left ventricular diastolic parameters were normal.  Right Ventricle: The right ventricular size is  normal. No increase in right ventricular wall thickness. Right ventricular systolic function is normal.  Left Atrium: Left atrial size was normal in size.  Right Atrium: Right atrial size was normal in size.  Pericardium: There is no evidence of pericardial effusion.  Mitral Valve: The mitral valve is normal in structure. No evidence of mitral valve regurgitation. No evidence of mitral valve stenosis.  Tricuspid Valve: The tricuspid valve is normal in structure. Tricuspid valve  regurgitation is not demonstrated. No evidence of tricuspid stenosis.  Aortic Valve: The aortic valve is normal in structure. Aortic valve regurgitation is not visualized. No aortic stenosis is present.  Pulmonic Valve: The pulmonic valve was normal in structure. Pulmonic valve regurgitation is not visualized. No evidence of pulmonic stenosis.  Aorta: The aortic root is normal in size and structure.  Venous: The inferior vena cava is normal in size with greater than 50% respiratory variability, suggesting right atrial pressure of 3 mmHg.  IAS/Shunts: No atrial level shunt detected by color flow Doppler.   LEFT VENTRICLE PLAX 2D LVIDd:         5.00 cm  Diastology LVIDs:         3.30 cm  LV e' medial:    8.10 cm/s LV PW:         1.20 cm  LV E/e' medial:  9.3 LV IVS:        1.10 cm  LV e' lateral:   13.10 cm/s LVOT diam:     2.30 cm  LV E/e' lateral: 5.8 LV SV:         89 LV SV Index:   36       2D Longitudinal Strain LVOT Area:     4.15 cm 2D Strain GLS (A2C):   -28.0 % 2D Strain GLS (A3C):   -17.9 % 2D Strain GLS (A4C):   -23.1 % 2D Strain GLS Avg:     -23.0 %  RIGHT VENTRICLE RV Basal diam:  3.50 cm RV S prime:     13.80 cm/s TAPSE (M-mode): 3.0 cm  LEFT ATRIUM             Index       RIGHT ATRIUM           Index LA diam:        2.90 cm 1.19 cm/m  RA Pressure: 3.00 mmHg LA Vol (A2C):   18.6 ml 7.61 ml/m  RA Area:     13.10 cm LA Vol (A4C):   26.0 ml 10.64 ml/m RA Volume:   31.50 ml  12.89  ml/m LA Biplane Vol: 23.1 ml 9.45 ml/m AORTIC VALVE LVOT Vmax:   102.00 cm/s LVOT Vmean:  68.500 cm/s LVOT VTI:    0.214 m  AORTA Ao Root diam: 2.40 cm  MITRAL VALVE               TRICUSPID VALVE Estimated RAP:  3.00 mmHg  MV E velocity: 75.40 cm/s  SHUNTS MV A velocity: 65.60 cm/s  Systemic VTI:  0.21 m MV E/A ratio:  1.15        Systemic Diam: 2.30 cm  Donato Schultz MD Electronically signed by Donato Schultz MD Signature Date/Time: 07/29/2020/3:43:17 PM    Final                 Recent Labs: 12/02/2021: ALT 8; Hemoglobin 14.9; Platelets 215.0; TSH 1.83 01/07/2022: BUN 10; Creatinine, Ser 0.84; Potassium 4.5; Sodium 139  Recent Lipid Panel    Component Value Date/Time   CHOL 92 12/02/2021 0754   TRIG 123.0 12/02/2021 0754   HDL 27.70 (L) 12/02/2021 0754   CHOLHDL 3 12/02/2021 0754   VLDL 24.6 12/02/2021 0754   LDLCALC 40 12/02/2021 0754   LDLDIRECT 90.0 06/27/2019 1507    Physical Exam:    VS:  BP 110/68   Pulse 85   Ht 6\' 1"  (1.854 m)   Wt 265 lb 3.2 oz (120.3 kg)  SpO2 96%   BMI 34.99 kg/m     Wt Readings from Last 3 Encounters:  04/15/22 265 lb 3.2 oz (120.3 kg)  01/08/22 253 lb 6 oz (114.9 kg)  12/09/21 259 lb 3.2 oz (117.6 kg)    Gen: No distress Neck: No JVD Cardiac: No Rubs or Gallops, no murmur, regular rate +2 radial pulses Respiratory: Clear to auscultation bilaterally, normal effort, normal  respiratory rate GI: Soft, nontender, non-distended  MS: No edema; moves all extremities  Integument: Skin feels well Neuro:  At time of evaluation, alert and oriented to person/place/time/situation  Psych: Normal affect, patient feels well   ASSESSMENT:    1. Coronary artery disease involving native coronary artery of native heart without angina pectoris   2. Hypertension associated with diabetes ([place])   3. Hyperlipidemia associated with type 2 diabetes mellitus (HCC)     PLAN:    Coronary Artery Disease; Obstructive HLD - asymptomatic  -  anatomy:  60% distal LCx stenosis  - continue ASA 81 mg - continue statin, goal LDL < 55 at goal) - continue BB - continue PRN nitrates (none needed)  HTN and DM Obesity - patient is on Jardiance  Aortic was normal on CT imaging.  One year follow up with me  Medication Adjustments/Labs and Tests Ordered: Current medicines are reviewed at length with the patient today.  Concerns regarding medicines are outlined above.  No orders of the defined types were placed in this encounter.  No orders of the defined types were placed in this encounter.   Patient Instructions  Medication Instructions:  Your physician recommends that you continue on your current medications as directed. Please refer to the Current Medication list given to you today.  *If you need a refill on your cardiac medications before your next appointment, please call your pharmacy*   Lab Work: NONE If you have labs (blood work) drawn today and your tests are completely normal, you will receive your results only by: [place] (if you have MyChart) OR A paper copy in the mail If you have any lab test that is abnormal or we need to change your treatment, we will call you to review the results.   Testing/Procedures: NONE   Follow-Up: At [facility], you and your health needs are our priority.  As part of our continuing mission to provide you with exceptional heart care, we have created designated Provider Care Teams.  These Care Teams include your primary Cardiologist (physician) and Advanced Practice Providers (APPs -  Physician Assistants and Nurse Practitioners) who all work together to provide you with the care you need, when you need it.   Your next appointment:   1 year(s)  Provider:   Werner Lean, MD       Signed, Werner Lean, MD  04/15/2022 4:14 PM    [place]

## 2022-04-16 NOTE — Addendum Note (Signed)
Addended by: Janan Halter F on: 04/16/2022 05:54 PM   Modules accepted: Orders

## 2022-04-17 ENCOUNTER — Other Ambulatory Visit: Payer: Self-pay | Admitting: Internal Medicine

## 2022-04-17 ENCOUNTER — Other Ambulatory Visit: Payer: Self-pay | Admitting: Endocrinology

## 2022-05-18 ENCOUNTER — Other Ambulatory Visit: Payer: Self-pay | Admitting: Internal Medicine

## 2022-05-18 ENCOUNTER — Other Ambulatory Visit: Payer: Self-pay | Admitting: Endocrinology

## 2022-06-09 ENCOUNTER — Other Ambulatory Visit: Payer: Self-pay | Admitting: Endocrinology

## 2022-06-09 DIAGNOSIS — E669 Obesity, unspecified: Secondary | ICD-10-CM

## 2022-06-11 ENCOUNTER — Other Ambulatory Visit (INDEPENDENT_AMBULATORY_CARE_PROVIDER_SITE_OTHER): Payer: 59

## 2022-06-11 DIAGNOSIS — E669 Obesity, unspecified: Secondary | ICD-10-CM

## 2022-06-11 DIAGNOSIS — E1169 Type 2 diabetes mellitus with other specified complication: Secondary | ICD-10-CM

## 2022-06-11 LAB — HEMOGLOBIN A1C: Hgb A1c MFr Bld: 7.4 % — ABNORMAL HIGH (ref 4.6–6.5)

## 2022-06-11 LAB — BASIC METABOLIC PANEL
BUN: 12 mg/dL (ref 6–23)
CO2: 30 mEq/L (ref 19–32)
Calcium: 9.1 mg/dL (ref 8.4–10.5)
Chloride: 103 mEq/L (ref 96–112)
Creatinine, Ser: 0.93 mg/dL (ref 0.40–1.50)
GFR: 98.18 mL/min (ref >60.00)
Glucose, Bld: 159 mg/dL — ABNORMAL HIGH (ref 70–99)
Potassium: 4.5 mEq/L (ref 3.5–5.1)
Sodium: 139 mEq/L (ref 135–145)

## 2022-06-15 ENCOUNTER — Encounter: Payer: Self-pay | Admitting: Endocrinology

## 2022-06-15 ENCOUNTER — Ambulatory Visit (INDEPENDENT_AMBULATORY_CARE_PROVIDER_SITE_OTHER): Payer: 59 | Admitting: Endocrinology

## 2022-06-15 VITALS — BP 132/82 | HR 72 | Ht 72.0 in | Wt 256.0 lb

## 2022-06-15 DIAGNOSIS — E1165 Type 2 diabetes mellitus with hyperglycemia: Secondary | ICD-10-CM | POA: Diagnosis not present

## 2022-06-15 MED ORDER — TIRZEPATIDE 5 MG/0.5ML ~~LOC~~ SOAJ: 5.0000 mg | SUBCUTANEOUS | 2 refills | Status: DC

## 2022-06-15 MED ORDER — TIRZEPATIDE 2.5 MG/0.5ML ~~LOC~~ SOAJ: 2.5000 mg | SUBCUTANEOUS | 0 refills | Status: DC

## 2022-06-15 NOTE — Patient Instructions (Signed)
Check blood sugars on waking up days a week  Also check blood sugars about 2 hours after meals and do this after different meals by rotation  Recommended blood sugar levels on waking up are 90-130 and about 2 hours after meal is 130-160  Please bring your blood sugar monitor to each visit, thank you  Start 2.5 [place] x 4 shots

## 2022-06-15 NOTE — Progress Notes (Unsigned)
Patient ID: Todd Gaines, male   DOB: June 04, 1975, 47 y.o.   MRN: [id number]           Reason for Appointment: Follow-up for Type 2 Diabetes  Referring physician: [facility]   History of Present Illness:          Date of diagnosis of type 2 diabetes mellitus: 2012       Background history:   He believes her A1c was 12% when he was first diagnosed to have diabetes and was having symptoms of feeling very tired. Initially he made changes in his lifestyle and was able to lose some weight also He was probably treated with metformin and may have been given Tradjenta subsequently Previous records are not available He thinks his A1c had been around 7-7.5% for some time until last year He was started on basal insulin in 9/16 when his A1c was 9.9 He did not use the V-go pump because of lack of adequate insurance coverage and out-of-pocket expense, this was improving his control  Recent history:   INSULIN regimen previously: Glargine insulin 40 units in the evening, Humalog before  dinner usually 10-12 units Previous OMNIPOD PUMP SETTINGS: Basal rate 1.25 midnight-6 AM and then 1.5 Carbohydrate ratio 1:1, correction 1: 50 and target 120-140, active insulin 4 hours   Non-insulin hypoglycemic drugs: Metformin ER [id number] mg a day, Trulicity 4.5 weekly, Jardiance 25mg   His A1c is stable at 6.9   Current blood sugar patterns and problems identified:   He has not brought his monitor for download again but he thinks he is checking his blood sugars very infrequently inks he is checking his blood sugars fairly regularly either morning or before or after dinner  He has been currently more active He is working with the school at [employer] and trying to exercise regularly Again his diabetes control is very stable with no change in A1c  His only difficulty is drinking 1 [employer] every day  Has ot 1 mth  taken his Trulicity regularly and has no nausea with this  Weight is slightly better  Taking  metformin and Jardiance regularly   Side effects from medications have been: Transient nausea from Trulicity  Compliance with the medical regimen: Fair    Glucose monitoring: Monitor not available for download  Blood sugars not available from home meter and lab glucose 159 fasting  Self-care:  Typical meal intake: Breakfast is eggs, sausage sometimes, otherwise may have breakfast burrito  Snacks will be peanut butter crackers, chips, pretzels and peanut butter               Dietician visit, most recent: 10/16                Weight history: 275-298 lbs  Wt Readings from Last 3 Encounters:  06/15/22 256 lb (116.1 kg)  04/15/22 265 lb 3.2 oz (120.3 kg)  01/08/22 253 lb 6 oz (114.9 kg)    Glycemic control:   Lab Results  Component Value Date   HGBA1C 7.4 (H) 06/11/2022   HGBA1C 6.9 (H) 01/07/2022   HGBA1C 6.9 (H) 09/01/2021   Lab Results  Component Value Date   MICROALBUR 1.1 01/07/2022   LDLCALC 40 12/02/2021   CREATININE 0.93 06/11/2022    Lab on 06/11/2022  Component Date Value Ref Range Status   Sodium 06/11/2022 139  135 - 145 mEq/L Final   Potassium 06/11/2022 4.5  3.5 - 5.1 mEq/L Final   Chloride 06/11/2022 103  96 - 112 mEq/L  Final   CO2 06/11/2022 30  19 - 32 mEq/L Final   Glucose, Bld 06/11/2022 159 (H)  70 - 99 mg/dL Final   BUN 06/11/2022 12  6 - 23 mg/dL Final   Creatinine, Ser 06/11/2022 0.93  0.40 - 1.50 mg/dL Final   GFR 06/11/2022 98.18  >60.00 mL/min Final   Calculated using the CKD-EPI Creatinine Equation (2021)   Calcium 06/11/2022 9.1  8.4 - 10.5 mg/dL Final   Hgb A1c MFr Bld 06/11/2022 7.4 (H)  4.6 - 6.5 % Final   Glycemic Control Guidelines for People with Diabetes:Non Diabetic:  <6%Goal of Therapy: <7%Additional Action Suggested:  >8%       Allergies as of 06/15/2022   No Known Allergies      Medication List        Accurate as of June 15, 2022  2:28 PM. If you have any questions, ask your nurse or doctor.           amphetamine-dextroamphetamine 20 MG tablet Commonly known as: ADDERALL Take 1 tablet (20 mg total) by mouth 2 (two) times daily.   aspirin EC 81 MG tablet Take 81 mg by mouth daily. Swallow whole.   atorvastatin 80 MG tablet Commonly known as: LIPITOR TAKE ONE TABLET BY MOUTH DAILY   clobetasol 0.05 % external solution Commonly known as: TEMOVATE Apply topically as needed (psoriasis).   glucose blood test strip Use OneTouch Ultra test strips as instructed to check blood sugar 4 times daily. DX:E11.65   Jardiance 25 MG Tabs tablet Generic drug: empagliflozin TAKE ONE TABLET BY MOUTH DAILY BEFORE BREAKFAST. START WHEN FINISHED WITH INVOKANA.   metFORMIN 500 MG 24 hr tablet Commonly known as: GLUCOPHAGE-XR TAKE FOUR TABLETS BY MOUTH DAILY   metoprolol tartrate 25 MG tablet Commonly known as: LOPRESSOR TAKE 1 TABLET BY MOUTH TWICE A DAY   nitroGLYCERIN 0.4 MG SL tablet Commonly known as: NITROSTAT Place 1 tablet (0.4 mg total) under the tongue every 5 (five) minutes as needed for chest pain.   NovoTwist 32G X 5 MM Misc Generic drug: Insulin Pen Needle Use one per day to inject victoza   Stelara 90 MG/ML Sosy injection Generic drug: ustekinumab Inject 90 mg into the skin once. Every 3 months.   Trulicity 4.5 0000000 Sopn Generic drug: Dulaglutide INJECT 4.5 MG UNDER THE SKIN ONCE WEEKLY        Allergies: No Known Allergies  Past Medical History:  Diagnosis Date   CAD (coronary artery disease)    Diabetes mellitus without complication    Elevated cholesterol    Psoriasis     Past Surgical History:  Procedure Laterality Date   CORONARY STENT INTERVENTION N/A 09/08/2019   Procedure: CORONARY STENT INTERVENTION;  Surgeon: Nelva Bush, MD;  Location: [facility];  Service: Cardiovascular;  Laterality: N/A;   LEFT HEART CATH AND CORONARY ANGIOGRAPHY N/A 09/08/2019   Procedure: LEFT HEART CATH AND CORONARY ANGIOGRAPHY;  Surgeon: Nelva Bush,  MD;  Location: [facility];  Service: Cardiovascular;  Laterality: N/A;    Family History  Problem Relation Age of Onset   Diabetes Mother    Diabetes Paternal Grandfather    Hypertension Paternal Grandfather    Heart disease Neg Hx    Cancer Neg Hx     Social History:  reports that he quit smoking about 20 years ago. His smoking use included cigarettes. He has a 2.50 pack-year smoking history. He has never used smokeless tobacco. He reports that he does  not drink alcohol and does not use drugs.    Review of Systems    Lipid history: On treatment with Lipitor 80 mg from his cardiologist since his diagnosis of CAD  Has persistently low HDL Triglycerides last normal   Lab Results  Component Value Date   CHOL 92 12/02/2021   HDL 27.70 (L) 12/02/2021   LDLCALC 40 12/02/2021   LDLDIRECT 90.0 06/27/2019   TRIG 123.0 12/02/2021   CHOLHDL 3 12/02/2021            Blood pressure normal without medications He is on Jardiance 25 mg daily  BP Readings from Last 3 Encounters:  06/15/22 132/82  04/15/22 110/68  01/08/22 128/70   Has had normal renal function consistently No increase in urine microalbumin  Lab Results  Component Value Date   CREATININE 0.93 06/11/2022   CREATININE 0.84 01/07/2022   CREATININE 1.00 12/02/2021   Last foot exam 08/2021 He is due for his eye exam but previous report not available, reportedly no retinopathy    Physical Examination:  BP 132/82   Pulse 72   Ht 6' (1.829 m)   Wt 256 lb (116.1 kg)   SpO2 94%   BMI 34.72 kg/m        ASSESSMENT:  Diabetes type 2, with obesity  See history of present illness for detailed discussion of his current management, blood sugar patterns and problems identified  His A1c is  at 6.9  He is using Trulicity 4.5 mg, Metformin and Jardiance 25 mg   Blood sugars are consistently controlled with current regimen Also with more exercise he is losing weight Currently not monitoring blood  sugars  PLAN:    Encouraged him to start checking blood sugars especially after meals To cut back on regular soft drinks and switch to no sugar added No change in medications  There are no Patient Instructions on file for this visit.     Elayne Snare 06/15/2022, 2:28 PM   Note: This office note was prepared with Dragon voice recognition system technology. Any transcriptional errors that result from this process are unintentional.

## 2022-07-22 ENCOUNTER — Other Ambulatory Visit: Payer: Self-pay | Admitting: Endocrinology

## 2022-08-06 ENCOUNTER — Encounter: Payer: Self-pay | Admitting: Endocrinology

## 2022-08-07 ENCOUNTER — Other Ambulatory Visit (HOSPITAL_COMMUNITY): Payer: Self-pay

## 2022-08-21 ENCOUNTER — Other Ambulatory Visit: Payer: Self-pay | Admitting: Endocrinology

## 2022-10-15 ENCOUNTER — Other Ambulatory Visit: Payer: 59

## 2022-10-16 ENCOUNTER — Other Ambulatory Visit (INDEPENDENT_AMBULATORY_CARE_PROVIDER_SITE_OTHER): Payer: 59

## 2022-10-16 DIAGNOSIS — E1165 Type 2 diabetes mellitus with hyperglycemia: Secondary | ICD-10-CM | POA: Diagnosis not present

## 2022-10-16 LAB — BASIC METABOLIC PANEL
BUN: 14 mg/dL (ref 6–23)
CO2: 30 mEq/L (ref 19–32)
Calcium: 8.8 mg/dL (ref 8.4–10.5)
Chloride: 104 mEq/L (ref 96–112)
Creatinine, Ser: 0.94 mg/dL (ref 0.40–1.50)
GFR: 96.69 mL/min (ref >60.00)
Glucose, Bld: 114 mg/dL — ABNORMAL HIGH (ref 70–99)
Potassium: 4.3 mEq/L (ref 3.5–5.1)
Sodium: 140 mEq/L (ref 135–145)

## 2022-10-16 LAB — HEMOGLOBIN A1C: Hgb A1c MFr Bld: 6.7 % — ABNORMAL HIGH (ref 4.6–6.5)

## 2022-10-19 ENCOUNTER — Ambulatory Visit (INDEPENDENT_AMBULATORY_CARE_PROVIDER_SITE_OTHER): Payer: 59 | Admitting: Endocrinology

## 2022-10-19 ENCOUNTER — Encounter: Payer: Self-pay | Admitting: Endocrinology

## 2022-10-19 VITALS — BP 115/70 | HR 75 | Ht 72.0 in | Wt 255.4 lb

## 2022-10-19 DIAGNOSIS — E1165 Type 2 diabetes mellitus with hyperglycemia: Secondary | ICD-10-CM

## 2022-10-19 DIAGNOSIS — Z7984 Long term (current) use of oral hypoglycemic drugs: Secondary | ICD-10-CM | POA: Diagnosis not present

## 2022-10-19 DIAGNOSIS — Z7985 Long-term (current) use of injectable non-insulin antidiabetic drugs: Secondary | ICD-10-CM

## 2022-10-19 NOTE — Progress Notes (Signed)
Patient ID: Todd Gaines, male   DOB: 1975-07-28, 47 y.o.   MRN: [id number]           Reason for Appointment: Follow-up for Type 2 Diabetes  Referring physician: [facility]   History of Present Illness:          Date of diagnosis of type 2 diabetes mellitus: 2012       Background history:   He believes her A1c was 12% when he was first diagnosed to have diabetes and was having symptoms of feeling very tired. Initially he made changes in his lifestyle and was able to lose some weight also He was probably treated with metformin and may have been given Tradjenta subsequently Previous records are not available He thinks his A1c had been around 7-7.5% for some time until last year He was started on basal insulin in 9/16 when his A1c was 9.9 He did not continue the V-go pump because of lack of adequate insurance coverage and out-of-pocket expense  INSULIN regimen previously: Glargine insulin 40 units in the evening, Humalog before  dinner usually 10-12 units Previous OMNIPOD PUMP SETTINGS: Basal rate 1.25 midnight-6 AM and then 1.5 Carbohydrate ratio 1:1, correction 1: 50 and target 120-140, active insulin 4 hours   Recent history:   Non-insulin hypoglycemic drugs: Metformin ER 2000 mg a day, Mounjaro 5 mg weekly, Jardiance 25mg   His A1c is much better at 6.7, previously was 7.4 and 6.9   Current blood sugar patterns and problems identified:   He had been unable to get his Trulicity and blood sugars were higher, and for this reason he was switched to [facility] and April 2024 He says that this does suppress his appetite and he does not get hungry especially at lunchtime However has not lost any weight He does sometimes eat out and may not always have the best choices as well as still periodically drinking regular soft drinks As before he does not bring his monitor for download About a month and this explains his higher A1c He has also not been seen in follow-up since 6/23 He is not  remembering to check his blood sugars and is still not motivated to do any for some time  He has been doing various activities at work but not any formal exercise He continues to be drinking 1 [employer] every day  Weight is about the same Taking metformin and Jardiance regularly   Side effects from medications have been: Transient nausea from Trulicity  Compliance with the medical regimen: Fair    Glucose monitoring: Monitor not available for download  Blood sugars by recall, fasting 90-115 Postprandial usually 90-140  Self-care:  Typical meal intake: Breakfast is eggs, sausage sometimes, otherwise may have breakfast burrito  Snacks will be peanut butter crackers, chips, pretzels and peanut butter               Dietician visit, most recent: 10/16                Weight history: Highest previously 298  Wt Readings from Last 3 Encounters:  10/19/22 255 lb 6.4 oz (115.8 kg)  06/15/22 256 lb (116.1 kg)  04/15/22 265 lb 3.2 oz (120.3 kg)    Glycemic control:   Lab Results  Component Value Date   HGBA1C 6.7 (H) 10/16/2022   HGBA1C 7.4 (H) 06/11/2022   HGBA1C 6.9 (H) 01/07/2022   Lab Results  Component Value Date   MICROALBUR 1.1 01/07/2022   LDLCALC 40 12/02/2021  CREATININE 0.94 10/16/2022    Lab on 10/16/2022  Component Date Value Ref Range Status   Sodium 10/16/2022 140  135 - 145 mEq/L Final   Potassium 10/16/2022 4.3  3.5 - 5.1 mEq/L Final   Chloride 10/16/2022 104  96 - 112 mEq/L Final   CO2 10/16/2022 30  19 - 32 mEq/L Final   Glucose, Bld 10/16/2022 114 (H)  70 - 99 mg/dL Final   BUN 16/12/9602 14  6 - 23 mg/dL Final   Creatinine, Ser 10/16/2022 0.94  0.40 - 1.50 mg/dL Final   GFR 54/11/8117 96.69  >60.00 mL/min Final   Calculated using the CKD-EPI Creatinine Equation (2021)   Calcium 10/16/2022 8.8  8.4 - 10.5 mg/dL Final   Hgb J4N MFr Bld 10/16/2022 6.7 (H)  4.6 - 6.5 % Final   Glycemic Control Guidelines for People with Diabetes:Non Diabetic:  <6%Goal  of Therapy: <7%Additional Action Suggested:  >8%       Allergies as of 10/19/2022   No Known Allergies      Medication List        Accurate as of October 19, 2022  2:18 PM. If you have any questions, ask your nurse or doctor.          amphetamine-dextroamphetamine 20 MG tablet Commonly known as: ADDERALL Take 1 tablet (20 mg total) by mouth 2 (two) times daily.   aspirin EC 81 MG tablet Take 81 mg by mouth daily. Swallow whole.   atorvastatin 80 MG tablet Commonly known as: LIPITOR TAKE ONE TABLET BY MOUTH DAILY   clobetasol 0.05 % external solution Commonly known as: TEMOVATE Apply topically as needed (psoriasis).   glucose blood test strip Use OneTouch Ultra test strips as instructed to check blood sugar 4 times daily. DX:E11.65   Jardiance 25 MG Tabs tablet Generic drug: empagliflozin TAKE 1 TABLET BY MOUTH EVERY DAY BEFORE BREAKFAST, START WHEN FINISHED WITH INVOKANA   metFORMIN 500 MG 24 hr tablet Commonly known as: GLUCOPHAGE-XR TAKE FOUR TABLETS BY MOUTH DAILY   metoprolol tartrate 25 MG tablet Commonly known as: LOPRESSOR TAKE 1 TABLET BY MOUTH TWICE A DAY   nitroGLYCERIN 0.4 MG SL tablet Commonly known as: NITROSTAT Place 1 tablet (0.4 mg total) under the tongue every 5 (five) minutes as needed for chest pain.   NovoTwist 32G X 5 MM Misc Generic drug: Insulin Pen Needle Use one per day to inject victoza   Stelara 90 MG/ML Sosy injection Generic drug: ustekinumab Inject 90 mg into the skin once. Every 3 months.   tirzepatide 5 MG/0.5ML Pen Commonly known as: MOUNJARO Inject 5 mg into the skin once a week. What changed: Another medication with the same name was removed. Continue taking this medication, and follow the directions you see here. Changed by: Reather Littler        Allergies: No Known Allergies  Past Medical History:  Diagnosis Date   CAD (coronary artery disease)    Diabetes mellitus without complication (HCC)    Elevated  cholesterol    Psoriasis     Past Surgical History:  Procedure Laterality Date   CORONARY STENT INTERVENTION N/A 09/08/2019   Procedure: CORONARY STENT INTERVENTION;  Surgeon: Yvonne Kendall, MD;  Location: [facility];  Service: Cardiovascular;  Laterality: N/A;   LEFT HEART CATH AND CORONARY ANGIOGRAPHY N/A 09/08/2019   Procedure: LEFT HEART CATH AND CORONARY ANGIOGRAPHY;  Surgeon: Yvonne Kendall, MD;  Location: [facility];  Service: Cardiovascular;  Laterality: N/A;  Family History  Problem Relation Age of Onset   Diabetes Mother    Diabetes Paternal Grandfather    Hypertension Paternal Grandfather    Heart disease Neg Hx    Cancer Neg Hx     Social History:  reports that he quit smoking about 20 years ago. His smoking use included cigarettes. He started smoking about 30 years ago. He has a 2.5 pack-year smoking history. He has never used smokeless tobacco. He reports that he does not drink alcohol and does not use drugs.    Review of Systems    Lipid history: On treatment with Lipitor 80 mg from his cardiologist since his diagnosis of CAD  Has persistently low HDL Triglycerides last normal   Lab Results  Component Value Date   CHOL 92 12/02/2021   HDL 27.70 (L) 12/02/2021   LDLCALC 40 12/02/2021   LDLDIRECT 90.0 06/27/2019   TRIG 123.0 12/02/2021   CHOLHDL 3 12/02/2021            Blood pressure normal without medications He is on Jardiance 25 mg daily  BP Readings from Last 3 Encounters:  10/19/22 115/70  06/15/22 132/82  04/15/22 110/68   Has had normal renal function consistently No increase in urine microalbumin  Lab Results  Component Value Date   CREATININE 0.94 10/16/2022   CREATININE 0.93 06/11/2022   CREATININE 0.84 01/07/2022   Last foot exam August 2024 He is due for his eye exam and needs to make an appointment    Physical Examination:  BP 115/70   Pulse 75   Ht 6' (1.829 m)   Wt 255 lb 6.4 oz (115.8 kg)   SpO2  98%   BMI 34.64 kg/m     Diabetic Foot Exam - Simple   Simple Foot Form Diabetic Foot exam was performed with the following findings: Yes 10/19/2022  2:26 PM  Visual Inspection No deformities, no ulcerations, no other skin breakdown bilaterally: Yes See comments: Yes Sensation Testing Intact to touch and monofilament testing bilaterally: Yes Pulse Check Posterior Tibialis and Dorsalis pulse intact bilaterally: Yes Comments    No edema    ASSESSMENT:  Diabetes type 2, with obesity, current BMI 35  See history of present illness for detailed discussion of his current management, blood sugar patterns and problems identified  His A1c is 6.7 compared to 7.4  He is on Mounjaro 5  mg, Metformin and Jardiance 25 mg  With his going back on GLP-1 drugs and using Mounjaro his level of control is excellent and better than before when he was on Trulicity However has not lost any weight He can likely cut back on extra calories with soft drinks also not doing any formal exercise As before unable to review objectively his blood sugar patterns at home as he does not bring his monitor  PLAN:    Since he is getting adequate satiety with Mounjaro and good level of glucose control he can stay on the 5 mg dose No need to change Mounjaro, Jardiance or metformin  He can benefit from formal exercise regimen also Also improved diet as above Follow-up in 4 months  There are no Patient Instructions on file for this visit.     Reather Littler 10/19/2022, 2:18 PM   Note: This office note was prepared with Dragon voice recognition system technology. Any transcriptional errors that result from this process are unintentional.

## 2022-10-19 NOTE — Patient Instructions (Signed)
Exercise program 2-3/7

## 2022-12-04 ENCOUNTER — Other Ambulatory Visit: Payer: Self-pay

## 2022-12-04 DIAGNOSIS — E1165 Type 2 diabetes mellitus with hyperglycemia: Secondary | ICD-10-CM

## 2022-12-04 MED ORDER — METFORMIN HCL ER 500 MG PO TB24
2000.0000 mg | ORAL_TABLET | Freq: Every day | ORAL | 2 refills | Status: DC
Start: 2022-12-04 — End: 2023-03-08

## 2022-12-14 LAB — HM DIABETES EYE EXAM

## 2022-12-28 ENCOUNTER — Telehealth: Payer: Self-pay | Admitting: Family Medicine

## 2022-12-28 DIAGNOSIS — E1159 Type 2 diabetes mellitus with other circulatory complications: Secondary | ICD-10-CM

## 2022-12-28 DIAGNOSIS — E1169 Type 2 diabetes mellitus with other specified complication: Secondary | ICD-10-CM

## 2022-12-28 NOTE — Telephone Encounter (Signed)
-----   Message from Lovena Neighbours sent at 12/16/2022 10:25 AM EDT ----- Regarding: Labs for Tuesday 10.15.24 Please put physical lab orders in future. Thank you, Denny Peon

## 2022-12-29 ENCOUNTER — Other Ambulatory Visit (INDEPENDENT_AMBULATORY_CARE_PROVIDER_SITE_OTHER): Payer: 59

## 2022-12-29 DIAGNOSIS — E1169 Type 2 diabetes mellitus with other specified complication: Secondary | ICD-10-CM

## 2022-12-29 DIAGNOSIS — E785 Hyperlipidemia, unspecified: Secondary | ICD-10-CM

## 2022-12-29 DIAGNOSIS — I152 Hypertension secondary to endocrine disorders: Secondary | ICD-10-CM

## 2022-12-29 DIAGNOSIS — E1159 Type 2 diabetes mellitus with other circulatory complications: Secondary | ICD-10-CM

## 2022-12-29 LAB — COMPREHENSIVE METABOLIC PANEL
ALT: 10 U/L (ref 0–53)
AST: 16 U/L (ref 0–37)
Albumin: 4 g/dL (ref 3.5–5.2)
Alkaline Phosphatase: 83 U/L (ref 39–117)
BUN: 11 mg/dL (ref 6–23)
CO2: 29 meq/L (ref 19–32)
Calcium: 9.1 mg/dL (ref 8.4–10.5)
Chloride: 105 meq/L (ref 96–112)
Creatinine, Ser: 0.95 mg/dL (ref 0.40–1.50)
GFR: 95.33 mL/min (ref >60.00)
Glucose, Bld: 133 mg/dL — ABNORMAL HIGH (ref 70–99)
Potassium: 4.4 meq/L (ref 3.5–5.1)
Sodium: 142 meq/L (ref 135–145)
Total Bilirubin: 0.5 mg/dL (ref 0.2–1.2)
Total Protein: 6.3 g/dL (ref 6.0–8.3)

## 2022-12-29 LAB — CBC WITH DIFFERENTIAL/PLATELET
Basophils Absolute: 0.1 10*3/uL (ref 0.0–0.1)
Basophils Relative: 0.8 % (ref 0.0–3.0)
Eosinophils Absolute: 0.5 10*3/uL (ref 0.0–0.7)
Eosinophils Relative: 7.2 % — ABNORMAL HIGH (ref 0.0–5.0)
HCT: 46.4 % (ref 39.0–52.0)
Hemoglobin: 15.3 g/dL (ref 13.0–17.0)
Lymphocytes Relative: 23.7 % (ref 12.0–46.0)
Lymphs Abs: 1.8 10*3/uL (ref 0.7–4.0)
MCHC: 33 g/dL (ref 30.0–36.0)
MCV: 87.1 fL (ref 78.0–100.0)
Monocytes Absolute: 0.6 10*3/uL (ref 0.1–1.0)
Monocytes Relative: 7.7 % (ref 3.0–12.0)
Neutro Abs: 4.6 10*3/uL (ref 1.4–7.7)
Neutrophils Relative %: 60.6 % (ref 43.0–77.0)
Platelets: 240 10*3/uL (ref 150.0–400.0)
RBC: 5.32 Mil/uL (ref 4.22–5.81)
RDW: 13.2 % (ref 11.5–15.5)
WBC: 7.5 10*3/uL (ref 4.0–10.5)

## 2022-12-29 LAB — LIPID PANEL
Cholesterol: 90 mg/dL (ref 0–200)
HDL: 28.6 mg/dL — ABNORMAL LOW (ref >39.00)
LDL Cholesterol: 40 mg/dL (ref 0–99)
NonHDL: 61.22
Total CHOL/HDL Ratio: 3
Triglycerides: 104 mg/dL (ref 0.0–149.0)
VLDL: 20.8 mg/dL (ref 0.0–40.0)

## 2022-12-29 LAB — TSH: TSH: 1.94 u[IU]/mL (ref 0.35–5.50)

## 2023-01-04 NOTE — Progress Notes (Unsigned)
Subjective:    Patient ID: Todd Gaines, male    DOB: 11-03-1975, 47 y.o.   MRN: [id number]  HPI  Here for health maintenance exam and to review chronic medical problems   Wt Readings from Last 3 Encounters:  01/05/23 253 lb (114.8 kg)  10/19/22 255 lb 6.4 oz (115.8 kg)  06/15/22 256 lb (116.1 kg)   34.08 kg/m  Vitals:   01/05/23 1457  BP: 104/70  Pulse: 77  Temp: 98.1 F (36.7 C)  SpO2: 97%    Immunization History  Administered Date(s) Administered   Influenza,inj,Quad PF,6+ Mos 04/18/2014, 01/10/2015, 12/06/2017, 02/13/2020, 12/31/2020   Influenza-Unspecified 11/29/2015   Moderna Sars-Covid-2 Vaccination 05/26/2019, 06/26/2019   Pneumococcal Polysaccharide-23 09/28/2014   Tdap 04/08/2016    Health Maintenance Due  Topic Date Due   Hepatitis C Screening  Never done   Colonoscopy  Never done   Diabetic kidney evaluation - Urine ACR  01/08/2023   Feeling pretty good overall  More fatigued in general   Occational swallowing is an issue  Food - bread/ meat  Occational has to vomit food up  Not a lot of heartburn  Does not clear throat  No voice changes No cough   Some fatigue Wonders if testosterone could be low  Also decreased libido    No choking  Has to take smaller bites and chew better       Flu shot - declines   Prostate health No problems with voiding  No nocturia  No fam history of prostate cancer    Eue exam 11/2022    Colon cancer screening -has not had colonoscopy Cologuard negative 12/2021     Bone health   Falls- none  Fractures-none  Supplements -none  (balanced diet)  Exercise  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)  Some lifting  Not as strong as he used to be  Has a gym available to him at work- may reconsider that    Derm care Psoriasis -doing well with  No changes Still on stelara (does well with it)    Mood    01/05/2023    3:34 PM  12/09/2021    3:28 PM 06/27/2019    3:23 PM 01/26/2018    3:59 PM 06/23/2017    5:05 PM  Depression screen PHQ 2/9  Decreased Interest 0 0 0 0 0  Down, Depressed, Hopeless 0 0 0 0 0  PHQ - 2 Score 0 0 0 0 0  Altered sleeping 0 0 0 0   Tired, decreased energy 0 2 1 0   Change in appetite 1 0 0 0   Feeling bad or failure about yourself  0 0 0 0   Trouble concentrating 0 0 2 0   Moving slowly or fidgety/restless 0 0 0 0   Suicidal thoughts 0 0 0 0   PHQ-9 Score 1 2 3  0   Difficult doing work/chores Not difficult at all   Not difficult at all   ADHD- still takes adderall when he needs it (work/ classes/trainings)  Really helps   No mood issues More fatigue   Some snoring but not all the time  Does feel rested after sleep    HTN bp is stable today  No cp or palpitations or headaches or edema  No side effects to medicines  BP Readings from Last 3 Encounters:  01/05/23 104/70  10/19/22 115/70  06/15/22  132/82     Metoprolol 25 mg bid   Pulse Readings from Last 3 Encounters:  01/05/23 77  10/19/22 75  06/15/22 72     Lab Results  Component Value Date   NA 142 12/29/2022   K 4.4 12/29/2022   CO2 29 12/29/2022   GLUCOSE 133 (H) 12/29/2022   BUN 11 12/29/2022   CREATININE 0.95 12/29/2022   CALCIUM 9.1 12/29/2022   GFR 95.33 12/29/2022   EGFR 108 02/21/2021   GFRNONAA >60 09/09/2019  Was not fasting for labs   Hyperlipidemia Atorvastatin 80  Sees cardiology for CAD Asa, beta blocker and statin   DM2 Under endocrinology care  Per pt doing ok - A1c under 7  Lab Results  Component Value Date   HGBA1C 6.7 (H) 10/16/2022   Usually low 100s fasting  Off insulin for a while now   On mounaro and jardiance    Hyperlipidemia Lab Results  Component Value Date   CHOL 90 12/29/2022   CHOL 92 12/02/2021   CHOL 93 05/06/2021   Lab Results  Component Value Date   HDL 28.60 (L) 12/29/2022   HDL 27.70 (L) 12/02/2021   HDL 27.20 (L) 05/06/2021   Lab Results   Component Value Date   LDLCALC 40 12/29/2022   LDLCALC 40 12/02/2021   LDLCALC 46 05/06/2021   Lab Results  Component Value Date   TRIG 104.0 12/29/2022   TRIG 123.0 12/02/2021   TRIG 95.0 05/06/2021   Lab Results  Component Value Date   CHOLHDL 3 12/29/2022   CHOLHDL 3 12/02/2021   CHOLHDL 3 05/06/2021   Lab Results  Component Value Date   LDLDIRECT 90.0 06/27/2019   LDLDIRECT 89.0 06/23/2017   LDLDIRECT 77.0 11/05/2015   Atorvastatin 80 mg daily  Chronic low HDL     Lab Results  Component Value Date   ALT 10 12/29/2022   AST 16 12/29/2022   ALKPHOS 83 12/29/2022   BILITOT 0.5 12/29/2022   Lab Results  Component Value Date   WBC 7.5 12/29/2022   HGB 15.3 12/29/2022   HCT 46.4 12/29/2022   MCV 87.1 12/29/2022   PLT 240.0 12/29/2022   Lab Results  Component Value Date   TSH 1.94 12/29/2022       Patient Active Problem List   Diagnosis Date Noted   Dysphagia 01/05/2023   Decreased libido 01/05/2023   Colon cancer screening 12/09/2021   Type 2 diabetes mellitus with obesity (HCC) 11/18/2021   Hypertension associated with diabetes (HCC) 08/16/2020   Class 1 obesity due to excess calories with serious comorbidity and body mass index (BMI) of 34.0 to 34.9 in adult 04/17/2020   Coronary artery disease involving native coronary artery of native heart without angina pectoris 04/17/2020   Routine general medical examination at a health care facility 01/26/2018   ADHD (attention deficit hyperactivity disorder) 04/08/2016   Hyperlipidemia associated with type 2 diabetes mellitus (HCC) 04/18/2014   Psoriasis 04/18/2014   Past Medical History:  Diagnosis Date   CAD (coronary artery disease)    Diabetes mellitus without complication (HCC)    Elevated cholesterol    Psoriasis    Past Surgical History:  Procedure Laterality Date   CORONARY STENT INTERVENTION N/A 09/08/2019   Procedure: CORONARY STENT INTERVENTION;  Surgeon: Yvonne Kendall, MD;  Location:  [facility];  Service: Cardiovascular;  Laterality: N/A;   LEFT HEART CATH AND CORONARY ANGIOGRAPHY N/A 09/08/2019   Procedure: LEFT HEART CATH AND CORONARY ANGIOGRAPHY;  Surgeon: Yvonne Kendall, MD;  Location: [facility];  Service: Cardiovascular;  Laterality: N/A;   Social History   Tobacco Use   Smoking status: Former    Current packs/day: 0.00    Average packs/day: 0.3 packs/day for 10.0 years (2.5 ttl pk-yrs)    Types: Cigarettes    Start date: 04/17/1992    Quit date: 04/17/2002    Years since quitting: 20.7   Smokeless tobacco: Never  Vaping Use   Vaping status: Never Used  Substance Use Topics   Alcohol use: No    Alcohol/week: 0.0 standard drinks of alcohol   Drug use: No   Family History  Problem Relation Age of Onset   Diabetes Mother    Diabetes Paternal Grandfather    Hypertension Paternal Grandfather    Heart disease Neg Hx    Cancer Neg Hx    No Known Allergies Current Outpatient Medications on File Prior to Visit  Medication Sig Dispense Refill   amphetamine-dextroamphetamine (ADDERALL) 20 MG tablet Take 1 tablet (20 mg total) by mouth 2 (two) times daily. 60 tablet 0   aspirin EC 81 MG tablet Take 81 mg by mouth daily. Swallow whole.     atorvastatin (LIPITOR) 80 MG tablet TAKE ONE TABLET BY MOUTH DAILY 30 tablet 8   clobetasol (TEMOVATE) 0.05 % external solution Apply topically as needed (psoriasis).     glucose blood test strip Use OneTouch Ultra test strips as instructed to check blood sugar 4 times daily. DX:E11.65 150 each 2   Insulin Pen Needle (NOVOTWIST) 32G X 5 MM MISC Use one per day to inject victoza 50 each 3   JARDIANCE 25 MG TABS tablet TAKE 1 TABLET BY MOUTH EVERY DAY BEFORE BREAKFAST, START WHEN FINISHED WITH INVOKANA 30 tablet 5   metFORMIN (GLUCOPHAGE-XR) 500 MG 24 hr tablet Take 4 tablets (2,000 mg total) by mouth daily. 120 tablet 2   metoprolol tartrate (LOPRESSOR) 25 MG tablet TAKE 1 TABLET BY MOUTH TWICE A DAY 180 tablet  3   nitroGLYCERIN (NITROSTAT) 0.4 MG SL tablet Place 1 tablet (0.4 mg total) under the tongue every 5 (five) minutes as needed for chest pain. 20 tablet 12   STELARA 90 MG/ML SOSY injection Inject 90 mg into the skin once. Every 3 months.     tirzepatide [facility]) 5 MG/0.5ML Pen Inject 5 mg into the skin once a week. 2 mL 2   No current facility-administered medications on file prior to visit.    Review of Systems  Constitutional:  Positive for fatigue. Negative for activity change, appetite change, fever and unexpected weight change.  HENT:  Positive for trouble swallowing. Negative for congestion, rhinorrhea and sore throat.   Eyes:  Negative for pain, redness, itching and visual disturbance.  Respiratory:  Negative for cough, chest tightness, shortness of breath and wheezing.   Cardiovascular:  Negative for chest pain and palpitations.  Gastrointestinal:  Negative for abdominal pain, blood in stool, constipation, diarrhea and nausea.  Endocrine: Negative for cold intolerance, heat intolerance, polydipsia and polyuria.  Genitourinary:  Negative for difficulty urinating, dysuria, frequency and urgency.       Decreased libido  Musculoskeletal:  Negative for arthralgias, joint swelling and myalgias.  Skin:  Negative for pallor and rash.  Neurological:  Negative for dizziness, tremors, weakness, numbness and headaches.  Hematological:  Negative for adenopathy. Does not bruise/bleed easily.  Psychiatric/Behavioral:  Negative for decreased concentration and dysphoric mood. The patient is not nervous/anxious.  Objective:   Physical Exam Constitutional:      General: He is not in acute distress.    Appearance: Normal appearance. He is well-developed. He is obese. He is not ill-appearing or diaphoretic.  HENT:     Head: Normocephalic and atraumatic.     Right Ear: Tympanic membrane, ear canal and external ear normal.     Left Ear: Tympanic membrane, ear canal and external ear  normal.     Nose: Nose normal. No congestion.     Mouth/Throat:     Mouth: Mucous membranes are moist.     Pharynx: Oropharynx is clear. No posterior oropharyngeal erythema.  Eyes:     General: No scleral icterus.       Right eye: No discharge.        Left eye: No discharge.     Conjunctiva/sclera: Conjunctivae normal.     Pupils: Pupils are equal, round, and reactive to light.  Neck:     Thyroid: No thyromegaly.     Vascular: No carotid bruit or JVD.  Cardiovascular:     Rate and Rhythm: Normal rate and regular rhythm.     Pulses: Normal pulses.     Heart sounds: Normal heart sounds.     No gallop.  Pulmonary:     Effort: Pulmonary effort is normal. No respiratory distress.     Breath sounds: Normal breath sounds. No wheezing or rales.     Comments: Good air exch Chest:     Chest wall: No tenderness.  Abdominal:     General: Bowel sounds are normal. There is no distension or abdominal bruit.     Palpations: Abdomen is soft. There is no mass.     Tenderness: There is no abdominal tenderness. There is no guarding or rebound.     Hernia: No hernia is present.  Musculoskeletal:        General: No tenderness.     Cervical back: Normal range of motion and neck supple. No rigidity. No muscular tenderness.     Right lower leg: No edema.     Left lower leg: No edema.  Lymphadenopathy:     Cervical: No cervical adenopathy.  Skin:    General: Skin is warm and dry.     Coloration: Skin is not pale.     Findings: No erythema or rash.     Comments: Solar lentigines diffusely  Some solar aging   Neurological:     Mental Status: He is alert.     Cranial Nerves: No cranial nerve deficit.     Motor: No abnormal muscle tone.     Coordination: Coordination normal.     Gait: Gait normal.     Deep Tendon Reflexes: Reflexes are normal and symmetric. Reflexes normal.  Psychiatric:        Attention and Perception: Attention normal.        Mood and Affect: Mood normal.        Cognition  and Memory: Cognition and memory normal.           Assessment & Plan:   Problem List Items Addressed This Visit       Cardiovascular and Mediastinum   Coronary artery disease involving native coronary artery of native heart without angina pectoris    Continues cardiology care  Atorvastatin  Metoprolol  Asa       Hypertension associated with diabetes (HCC)    bp in fair control at this time  BP Readings from Last 1 Encounters:  01/05/23 104/70   No changes needed Most recent labs reviewed  Disc lifstyle change with low sodium diet and exercise  Doing well with metoprolol 25 mg bid  Enc him to get back on better diet         Digestive   Dysphagia    New  Esophageal- happens when eating fast  Occational heartburn Prescription pepcid 20 mg bid - use 1 mo /if not resolved recommend GI ref/possible EGD  Call back and Er precautions noted in detail today          Endocrine   Hyperlipidemia associated with type 2 diabetes mellitus (HCC)    Disc goals for lipids and reasons to control them Rev last labs with pt Rev low sat fat diet in detail  LDL in good control at 40 in setting of CAD Continues atorvastatin 80 mg daily  Chronic low HDL-encouraged more exercise       Type 2 diabetes mellitus with obesity (HCC)    Lab Results  Component Value Date   HGBA1C 6.7 (H) 10/16/2022   Under endocronology care  Mounjaro and jardiance No longer on insulin         Musculoskeletal and Integument   Psoriasis    On stelara Utd derm care Doing well        Other   ADHD (attention deficit hyperactivity disorder)    Does well with adderall 20 mg bid prn on days he needs it  Toelrates well overall       Class 1 obesity due to excess calories with serious comorbidity and body mass index (BMI) of 34.0 to 34.9 in adult    Discussed how this problem influences overall health and the risks it imposes  Reviewed plan for weight loss with lower calorie diet (via better  food choices (lower glycemic and portion control) along with exercise building up to or more than 30 minutes 5 days per week including some aerobic activity and strength training         Colon cancer screening    Cologuard neg 08/2021       Decreased libido    With fatigue  May return for testosterone level in future       Routine general medical examination at a health care facility - Primary    Reviewed health habits including diet and exercise and skin cancer prevention Reviewed appropriate screening tests for age  Also reviewed health mt list, fam hx and immunization status , as well as social and family history   See HPI Labs reviewed and ordered Declines flu shot  No prostate concerns Oph exam utd 11/2022 Cologuard neg 12/2021  Discussed fall prevention, supplements and exercise for bone density  Dermatology care is utd  PHQ 2 due to fatigue  Voices concerns re: possible low testosterone-mey check this in future

## 2023-01-05 ENCOUNTER — Ambulatory Visit: Payer: 59 | Admitting: Family Medicine

## 2023-01-05 ENCOUNTER — Encounter: Payer: Self-pay | Admitting: Family Medicine

## 2023-01-05 ENCOUNTER — Encounter: Payer: 59 | Admitting: Family Medicine

## 2023-01-05 VITALS — BP 104/70 | HR 77 | Temp 98.1°F | Ht 72.25 in | Wt 253.0 lb

## 2023-01-05 DIAGNOSIS — E1159 Type 2 diabetes mellitus with other circulatory complications: Secondary | ICD-10-CM

## 2023-01-05 DIAGNOSIS — E1169 Type 2 diabetes mellitus with other specified complication: Secondary | ICD-10-CM | POA: Diagnosis not present

## 2023-01-05 DIAGNOSIS — Z1211 Encounter for screening for malignant neoplasm of colon: Secondary | ICD-10-CM

## 2023-01-05 DIAGNOSIS — R1319 Other dysphagia: Secondary | ICD-10-CM

## 2023-01-05 DIAGNOSIS — E669 Obesity, unspecified: Secondary | ICD-10-CM

## 2023-01-05 DIAGNOSIS — R1310 Dysphagia, unspecified: Secondary | ICD-10-CM | POA: Insufficient documentation

## 2023-01-05 DIAGNOSIS — E785 Hyperlipidemia, unspecified: Secondary | ICD-10-CM

## 2023-01-05 DIAGNOSIS — Z6834 Body mass index (BMI) 34.0-34.9, adult: Secondary | ICD-10-CM

## 2023-01-05 DIAGNOSIS — E66811 Other obesity due to excess calories: Secondary | ICD-10-CM

## 2023-01-05 DIAGNOSIS — Z7984 Long term (current) use of oral hypoglycemic drugs: Secondary | ICD-10-CM

## 2023-01-05 DIAGNOSIS — R6882 Decreased libido: Secondary | ICD-10-CM | POA: Insufficient documentation

## 2023-01-05 DIAGNOSIS — I2510 Atherosclerotic heart disease of native coronary artery without angina pectoris: Secondary | ICD-10-CM

## 2023-01-05 DIAGNOSIS — F902 Attention-deficit hyperactivity disorder, combined type: Secondary | ICD-10-CM | POA: Diagnosis not present

## 2023-01-05 DIAGNOSIS — Z7985 Long-term (current) use of injectable non-insulin antidiabetic drugs: Secondary | ICD-10-CM

## 2023-01-05 DIAGNOSIS — Z0000 Encounter for general adult medical examination without abnormal findings: Secondary | ICD-10-CM

## 2023-01-05 DIAGNOSIS — L409 Psoriasis, unspecified: Secondary | ICD-10-CM

## 2023-01-05 DIAGNOSIS — I152 Hypertension secondary to endocrine disorders: Secondary | ICD-10-CM

## 2023-01-05 DIAGNOSIS — E6609 Other obesity due to excess calories: Secondary | ICD-10-CM

## 2023-01-05 MED ORDER — FAMOTIDINE 20 MG PO TABS: 20.0000 mg | ORAL_TABLET | Freq: Two times a day (BID) | ORAL | 1 refills | Status: DC

## 2023-01-05 NOTE — Assessment & Plan Note (Signed)
Lab Results  Component Value Date   HGBA1C 6.7 (H) 10/16/2022   Under endocronology care  Mounjaro and jardiance No longer on insulin

## 2023-01-05 NOTE — Assessment & Plan Note (Signed)
Cologuard neg 08/2021

## 2023-01-05 NOTE — Assessment & Plan Note (Signed)
bp in fair control at this time  BP Readings from Last 1 Encounters:  01/05/23 104/70   No changes needed Most recent labs reviewed  Disc lifstyle change with low sodium diet and exercise  Doing well with metoprolol 25 mg bid  Enc him to get back on better diet

## 2023-01-05 NOTE — Assessment & Plan Note (Signed)
Reviewed health habits including diet and exercise and skin cancer prevention Reviewed appropriate screening tests for age  Also reviewed health mt list, fam hx and immunization status , as well as social and family history   See HPI Labs reviewed and ordered Declines flu shot  No prostate concerns Oph exam utd 11/2022 Cologuard neg 12/2021  Discussed fall prevention, supplements and exercise for bone density  Dermatology care is utd  PHQ 2 due to fatigue  Voices concerns re: possible low testosterone-mey check this in future

## 2023-01-05 NOTE — Assessment & Plan Note (Signed)
Does well with adderall 20 mg bid prn on days he needs it  Toelrates well overall

## 2023-01-05 NOTE — Assessment & Plan Note (Signed)
Continues cardiology care  Atorvastatin  Metoprolol  Jonne Ply

## 2023-01-05 NOTE — Assessment & Plan Note (Signed)
Disc goals for lipids and reasons to control them Rev last labs with pt Rev low sat fat diet in detail  LDL in good control at 40 in setting of CAD Continues atorvastatin 80 mg daily  Chronic low HDL-encouraged more exercise

## 2023-01-05 NOTE — Assessment & Plan Note (Signed)
 Discussed how this problem influences overall health and the risks it imposes  Reviewed plan for weight loss with lower calorie diet (via better food choices (lower glycemic and portion control) along with exercise building up to or more than 30 minutes 5 days per week including some aerobic activity and strength training

## 2023-01-05 NOTE — Assessment & Plan Note (Signed)
On stelara [facility]td derm care Doing well

## 2023-01-05 NOTE — Assessment & Plan Note (Signed)
With fatigue  May return for testosterone level in future

## 2023-01-05 NOTE — Assessment & Plan Note (Signed)
New  Esophageal- happens when eating fast  Occational heartburn Prescription pepcid 20 mg bid - use 1 mo /if not resolved recommend GI ref/possible EGD  Call back and Er precautions noted in detail today

## 2023-01-05 NOTE — Patient Instructions (Addendum)
Add some strength training to your routine, this is important for bone and brain health and can reduce your risk of falls and help your body use insulin properly and regulate weight  Light weights, exercise bands , and internet videos are a good way to start  Yoga (chair or regular), machines , floor exercises or a gym with machines are also good options   Start some pepcid 20 mg twice daily for acid reflux  This could cause your swallowing problem  If swallowing problem is not gone in a month let me know -we would refer to GI to consider endoscopy  Take care of yourself   Try to eat a healthy diet  Try to get most of your carbohydrates from produce (with the exception of white potatoes) and whole grains Eat less bread/pasta/rice/snack foods/cereals/sweets and other items from the middle of the grocery store (processed carbs)  Wear sun protection -try to be better about that

## 2023-01-10 ENCOUNTER — Telehealth: Payer: Self-pay | Admitting: Family Medicine

## 2023-01-10 DIAGNOSIS — R6882 Decreased libido: Secondary | ICD-10-CM

## 2023-01-10 NOTE — Telephone Encounter (Signed)
-----   Message from Alvina Chou sent at 01/08/2023  3:56 PM EDT ----- Regarding: Lab orders for Tu, 10.29.24 Lab orders, thanks

## 2023-01-12 ENCOUNTER — Other Ambulatory Visit (INDEPENDENT_AMBULATORY_CARE_PROVIDER_SITE_OTHER): Payer: 59

## 2023-01-12 DIAGNOSIS — R6882 Decreased libido: Secondary | ICD-10-CM

## 2023-01-12 DIAGNOSIS — R7989 Other specified abnormal findings of blood chemistry: Secondary | ICD-10-CM

## 2023-01-13 LAB — TESTOSTERONE: Testosterone: 232.31 ng/dL — ABNORMAL LOW (ref 300.00–890.00)

## 2023-01-14 DIAGNOSIS — R7989 Other specified abnormal findings of blood chemistry: Secondary | ICD-10-CM | POA: Insufficient documentation

## 2023-01-14 NOTE — Addendum Note (Signed)
Addended by: Roxy Manns A on: 01/14/2023 01:50 PM   Modules accepted: Orders

## 2023-01-25 ENCOUNTER — Telehealth: Payer: Self-pay

## 2023-01-25 ENCOUNTER — Other Ambulatory Visit (INDEPENDENT_AMBULATORY_CARE_PROVIDER_SITE_OTHER): Payer: 59

## 2023-01-25 DIAGNOSIS — E1165 Type 2 diabetes mellitus with hyperglycemia: Secondary | ICD-10-CM

## 2023-01-25 LAB — BASIC METABOLIC PANEL
BUN: 13 mg/dL (ref 6–23)
CO2: 28 meq/L (ref 19–32)
Calcium: 9 mg/dL (ref 8.4–10.5)
Chloride: 104 meq/L (ref 96–112)
Creatinine, Ser: 0.93 mg/dL (ref 0.40–1.50)
GFR: 97.75 mL/min (ref >60.00)
Glucose, Bld: 117 mg/dL — ABNORMAL HIGH (ref 70–99)
Potassium: 4.5 meq/L (ref 3.5–5.1)
Sodium: 139 meq/L (ref 135–145)

## 2023-01-25 LAB — MICROALBUMIN / CREATININE URINE RATIO
Creatinine,U: 79.7 mg/dL
Microalb Creat Ratio: 1.2 mg/g (ref 0.0–30.0)
Microalb, Ur: 0.9 mg/dL (ref 0.0–1.9)

## 2023-01-25 LAB — HEMOGLOBIN A1C: Hgb A1c MFr Bld: 7 % — ABNORMAL HIGH (ref 4.6–6.5)

## 2023-01-25 NOTE — Telephone Encounter (Signed)
Orders Placed This Encounter  Procedures   Microalbumin / creatinine urine ratio    Standing Status:   Future    Standing Expiration Date:   01/25/2024   Hemoglobin A1c    Standing Status:   Future    Standing Expiration Date:   01/25/2024   Basic metabolic panel    Standing Status:   Future    Standing Expiration Date:   01/25/2024

## 2023-01-28 ENCOUNTER — Other Ambulatory Visit: Payer: Self-pay

## 2023-01-28 DIAGNOSIS — E1165 Type 2 diabetes mellitus with hyperglycemia: Secondary | ICD-10-CM

## 2023-01-28 MED ORDER — EMPAGLIFLOZIN 25 MG PO TABS: ORAL_TABLET | ORAL | 5 refills | Status: DC

## 2023-02-01 ENCOUNTER — Encounter: Payer: Self-pay | Admitting: Endocrinology

## 2023-02-01 ENCOUNTER — Ambulatory Visit (INDEPENDENT_AMBULATORY_CARE_PROVIDER_SITE_OTHER): Payer: 59 | Admitting: Endocrinology

## 2023-02-01 VITALS — BP 136/88 | HR 86 | Resp 16 | Ht 72.03 in | Wt 253.6 lb

## 2023-02-01 DIAGNOSIS — Z7985 Long-term (current) use of injectable non-insulin antidiabetic drugs: Secondary | ICD-10-CM | POA: Diagnosis not present

## 2023-02-01 DIAGNOSIS — R7989 Other specified abnormal findings of blood chemistry: Secondary | ICD-10-CM

## 2023-02-01 DIAGNOSIS — Z7984 Long term (current) use of oral hypoglycemic drugs: Secondary | ICD-10-CM

## 2023-02-01 DIAGNOSIS — E118 Type 2 diabetes mellitus with unspecified complications: Secondary | ICD-10-CM

## 2023-02-01 NOTE — Patient Instructions (Signed)
Please complete lab in the morning fasting.

## 2023-02-01 NOTE — Progress Notes (Signed)
Outpatient Endocrinology Note Todd Yeraldin Litzenberger, MD  02/01/23  Patient's Name: Todd Gaines    DOB: 18-Dec-1975    MRN: [id number]                                                    REASON OF VISIT: Follow up of type 2 diabetes mellitus  PCP: Tower, Audrie Gallus, MD  HISTORY OF PRESENT ILLNESS:   Todd Gaines is a 47 y.o. old male with past medical history listed below, is here for follow up for type 2 diabetes mellitus.  He has new complaints of lower total testosterone.  Pertinent Diabetes History: Patient was diagnosed with type 2 diabetes mellitus in 2012.  He was started on insulin therapy in 2016.  Chronic Diabetes Complications : Retinopathy: yes, mild non proliferative. Last ophthalmology exam was done on 11/2022, following with ophthalmology regularly. Reviewed note.  Nephropathy: no Peripheral neuropathy: on Coronary artery disease: yes Stroke: no  Relevant comorbidities and cardiovascular risk factors: Obesity: yes Body mass index is 34.37 kg/m.  Hypertension: Yes  Hyperlipidemia : Yes, on statin   Current / Home Diabetic regimen includes: Metformin extended release 2000 mg daily. Mounjaro 5 mg weekly.   Jardiance 25 mg daily.  Prior diabetic medications: V-Go pump stopped due to cost issue.  Basal bolus regimen.  Used to be on OmniPod insulin pump in the past.  Trulicity in the past, was switched to [facility] in April 2024.  Transient nausea from Trulicity.  Glycemic data:   He has not been checking blood sugar at home.  Hypoglycemia: Patient has denies hypoglycemic episodes. Patient has hypoglycemia awareness.  Factors modifying glucose control: 1.  Diabetic diet assessment: 3 meals a day, occasionally eating [facility].  2.  Staying active or exercising: No formal exercise.  3.  Medication compliance: compliant most of the time.  # Low testosterone :  He had low testosterone of 232 with low normal limit of 300 in October as follows, but checked around 2  PM in the afternoon.  This was checked due to fatigue.  Patient has mild decline in libido and also has mild erectile dysfunction.  He has daughter age of 51 in 2024.  No history of opioid intake.  No history of taking androgen /steroid for muscle building.  No history of head trauma or testicular trauma.  Patient was referred to urology for evaluation, he reports he is waiting to schedule visit.   Latest Reference Range & Units 01/12/23 13:58  Testosterone 300.00 - 890.00 ng/dL 914.78 (L)  (L): Data is abnormally low  Interval history  Diabetes regimen as noted above.  Recent hemoglobin A1c 7%.  He had recently low total testosterone around 2 PM and discussed about further evaluation.  He has complaints of fatigue.  He had normal thyroid function test recently and last month.  No other complaints today.  REVIEW OF SYSTEMS As per history of present illness.   PAST MEDICAL HISTORY: Past Medical History:  Diagnosis Date   CAD (coronary artery disease)    Diabetes mellitus without complication (HCC)    Elevated cholesterol    Psoriasis     PAST SURGICAL HISTORY: Past Surgical History:  Procedure Laterality Date   CORONARY STENT INTERVENTION N/A 09/08/2019   Procedure: CORONARY STENT INTERVENTION;  Surgeon: Yvonne Kendall, MD;  Location: [facility] INVASIVE  CV LAB;  Service: Cardiovascular;  Laterality: N/A;   LEFT HEART CATH AND CORONARY ANGIOGRAPHY N/A 09/08/2019   Procedure: LEFT HEART CATH AND CORONARY ANGIOGRAPHY;  Surgeon: Yvonne Kendall, MD;  Location: [facility] INVASIVE CV LAB;  Service: Cardiovascular;  Laterality: N/A;    ALLERGIES: No Known Allergies  FAMILY HISTORY:  Family History  Problem Relation Age of Onset   Diabetes Mother    Diabetes Paternal Grandfather    Hypertension Paternal Grandfather    Heart disease Neg Hx    Cancer Neg Hx     SOCIAL HISTORY: Social History   Socioeconomic History   Marital status: Married    Spouse name: Not on file   Number of  children: Not on file   Years of education: Not on file   Highest education level: Not on file  Occupational History   Not on file  Tobacco Use   Smoking status: Former    Current packs/day: 0.00    Average packs/day: 0.3 packs/day for 10.0 years (2.5 ttl pk-yrs)    Types: Cigarettes    Start date: 04/17/1992    Quit date: 04/17/2002    Years since quitting: 20.8   Smokeless tobacco: Never  Vaping Use   Vaping status: Never Used  Substance and Sexual Activity   Alcohol use: No    Alcohol/week: 0.0 standard drinks of alcohol   Drug use: No   Sexual activity: Yes  Other Topics Concern   Not on file  Social History Narrative   Not on file   Social Determinants of Health   Financial Resource Strain: Not on file  Food Insecurity: Not on file  Transportation Needs: Not on file  Physical Activity: Not on file  Stress: Not on file  Social Connections: Not on file    MEDICATIONS:  Current Outpatient Medications  Medication Sig Dispense Refill   amphetamine-dextroamphetamine (ADDERALL) 20 MG tablet Take 1 tablet (20 mg total) by mouth 2 (two) times daily. 60 tablet 0   aspirin EC 81 MG tablet Take 81 mg by mouth daily. Swallow whole.     atorvastatin (LIPITOR) 80 MG tablet TAKE ONE TABLET BY MOUTH DAILY 30 tablet 8   clobetasol (TEMOVATE) 0.05 % external solution Apply topically as needed (psoriasis).     empagliflozin (JARDIANCE) 25 MG TABS tablet TAKE 1 TABLET BY MOUTH EVERY DAY BEFORE BREAKFAST 30 tablet 5   famotidine (PEPCID) 20 MG tablet Take 1 tablet (20 mg total) by mouth 2 (two) times daily. 60 tablet 1   glucose blood test strip Use OneTouch Ultra test strips as instructed to check blood sugar 4 times daily. DX:E11.65 150 each 2   Insulin Pen Needle (NOVOTWIST) 32G X 5 MM MISC Use one per day to inject victoza 50 each 3   metFORMIN (GLUCOPHAGE-XR) 500 MG 24 hr tablet Take 4 tablets (2,000 mg total) by mouth daily. 120 tablet 2   metoprolol tartrate (LOPRESSOR) 25 MG tablet  TAKE 1 TABLET BY MOUTH TWICE A DAY 180 tablet 3   nitroGLYCERIN (NITROSTAT) 0.4 MG SL tablet Place 1 tablet (0.4 mg total) under the tongue every 5 (five) minutes as needed for chest pain. 20 tablet 12   STELARA 90 MG/ML SOSY injection Inject 90 mg into the skin once. Every 3 months.     tirzepatide [facility]) 5 MG/0.5ML Pen Inject 5 mg into the skin once a week. 2 mL 2   No current facility-administered medications for this visit.    PHYSICAL EXAM: Vitals:  02/01/23 1351  BP: 136/88  Pulse: 86  Resp: 16  SpO2: 98%  Weight: 253 lb 9.6 oz (115 kg)  Height: 6' 0.03" (1.83 m)   Body mass index is 34.37 kg/m.  Wt Readings from Last 3 Encounters:  02/01/23 253 lb 9.6 oz (115 kg)  01/05/23 253 lb (114.8 kg)  10/19/22 255 lb 6.4 oz (115.8 kg)    General: Well developed, well nourished male in no apparent distress.  HEENT: AT/[place], no external lesions.  Eyes: Conjunctiva clear and no icterus. Neck: Neck supple  Lungs: Respirations not labored Neurologic: Alert, oriented, normal speech Extremities / Skin: Dry.   Psychiatric: Does not appear depressed or anxious  Diabetic Foot Exam - Simple   No data filed    LABS Reviewed Lab Results  Component Value Date   HGBA1C 7.0 (H) 01/25/2023   HGBA1C 6.7 (H) 10/16/2022   HGBA1C 7.4 (H) 06/11/2022   Lab Results  Component Value Date   FRUCTOSAMINE 258 08/30/2019   FRUCTOSAMINE 400 (H) 04/20/2018   FRUCTOSAMINE 322 (H) 12/16/2015   Lab Results  Component Value Date   CHOL 90 12/29/2022   HDL 28.60 (L) 12/29/2022   LDLCALC 40 12/29/2022   LDLDIRECT 90.0 06/27/2019   TRIG 104.0 12/29/2022   CHOLHDL 3 12/29/2022   Lab Results  Component Value Date   MICRALBCREAT 1.2 01/25/2023   MICRALBCREAT 1.3 01/07/2022   Lab Results  Component Value Date   CREATININE 0.93 01/25/2023   Lab Results  Component Value Date   GFR 97.75 01/25/2023    ASSESSMENT / PLAN  1. Controlled type 2 diabetes mellitus with complication, without  long-term current use of insulin (HCC)   2. Low testosterone in male     Diabetes Mellitus type 2, complicated by diabetic retinopathy. - Diabetic status / severity: Controlled.  Lab Results  Component Value Date   HGBA1C 7.0 (H) 01/25/2023    - Hemoglobin A1c goal : <7%  Discussed about increasing Mounjaro to have weight loss benefit.  Patient wants to stay on the same dose.  - Medications: No change. Metformin extended release 2000 mg daily. Mounjaro 5 mg weekly.   Jardiance 25 mg daily  - Home glucose testing: Advised to check at least in the morning fasting. - Discussed/ Gave Hypoglycemia treatment plan.  # Consult : not required at this time.   # Annual urine for microalbuminuria/ creatinine ratio, no microalbuminuria currently. Last  Lab Results  Component Value Date   MICRALBCREAT 1.2 01/25/2023    # Foot check nightly.  # He has diabetic retinopathy, follow-up with ophthalmology..   - Diet: Make healthy diabetic food choices.  Advised to avoid [facility]. - Life style / activity / exercise: Discussed.  2. Blood pressure  -  BP Readings from Last 1 Encounters:  02/01/23 136/88    - Control is in target.  - No change in current plans.  3. Lipid status / Hyperlipidemia - Last  Lab Results  Component Value Date   LDLCALC 40 12/29/2022   - Continue atorvastatin 80 mg daily.  # Low testosterone. -Patient had low total testosterone in the afternoon 232.  It requires further evaluation. -I would like to check following labs for further evaluation of low testosterone, check total and free testosterone, check LH, FSH, prolactin, sex hormone binding globulin, iron studies and cortisol.  I would also like to check PSA. -Patient is asked to complete lab in the early morning fasting.  Diagnoses and all orders for  this visit:  Controlled type 2 diabetes mellitus with complication, without long-term current use of insulin (HCC)  Low testosterone in male -      Sex hormone binding globulin; Future -     Testosterone,Free and Total; Future -     Luteinizing hormone; Future -     Prolactin; Future -     Follicle stimulating hormone; Future -     Cortisol; Future -     Iron, TIBC and Ferritin Panel; Future -     PSA; Future    DISPOSITION Follow up in clinic in 4  months suggested.   All questions answered and patient verbalized understanding of the plan.  Todd Zyaire Dumas, MD [facility] Endocrinology [facility] [address] [place], [address] [place], [place] [address] Phone # [phone]  At least part of this note was generated using voice recognition software. Inadvertent word errors may have occurred, which were not recognized during the proofreading process.

## 2023-02-04 ENCOUNTER — Other Ambulatory Visit (INDEPENDENT_AMBULATORY_CARE_PROVIDER_SITE_OTHER): Payer: 59

## 2023-02-04 DIAGNOSIS — R7989 Other specified abnormal findings of blood chemistry: Secondary | ICD-10-CM | POA: Diagnosis not present

## 2023-02-04 LAB — FOLLICLE STIMULATING HORMONE: FSH: 5.6 m[IU]/mL (ref 1.4–18.1)

## 2023-02-04 LAB — CORTISOL: Cortisol, Plasma: 12.5 ug/dL

## 2023-02-04 LAB — PSA: PSA: 0.91 ng/mL (ref 0.10–4.00)

## 2023-02-04 LAB — LUTEINIZING HORMONE: LH: 5.4 m[IU]/mL (ref 1.50–9.30)

## 2023-02-05 LAB — IRON,TIBC AND FERRITIN PANEL
%SAT: 24 % (ref 20–48)
Ferritin: 36 ng/mL — ABNORMAL LOW (ref 38–380)
Iron: 80 ug/dL (ref 50–180)
TIBC: 332 ug/dL (ref 250–425)

## 2023-02-05 LAB — PROLACTIN: Prolactin: 5.7 ng/mL (ref 2.0–18.0)

## 2023-02-05 LAB — SEX HORMONE BINDING GLOBULIN: Sex Hormone Binding: 26 nmol/L (ref 10–50)

## 2023-02-07 LAB — TESTOSTERONE,FREE AND TOTAL
Testosterone, Free: 12.2 pg/mL (ref 6.8–21.5)
Testosterone: 382 ng/dL (ref 264–916)

## 2023-02-24 ENCOUNTER — Other Ambulatory Visit: Payer: Self-pay | Admitting: Internal Medicine

## 2023-03-07 ENCOUNTER — Other Ambulatory Visit: Payer: Self-pay | Admitting: Endocrinology

## 2023-03-07 ENCOUNTER — Other Ambulatory Visit: Payer: Self-pay | Admitting: Family Medicine

## 2023-03-07 DIAGNOSIS — E1165 Type 2 diabetes mellitus with hyperglycemia: Secondary | ICD-10-CM

## 2023-04-09 ENCOUNTER — Other Ambulatory Visit: Payer: Self-pay

## 2023-04-09 MED ORDER — TIRZEPATIDE 5 MG/0.5ML ~~LOC~~ SOAJ: 5.0000 mg | SUBCUTANEOUS | 2 refills | Status: DC

## 2023-04-29 ENCOUNTER — Other Ambulatory Visit: Payer: Self-pay | Admitting: Internal Medicine

## 2023-05-04 ENCOUNTER — Encounter: Payer: Self-pay | Admitting: Family Medicine

## 2023-05-04 ENCOUNTER — Ambulatory Visit: Payer: 59 | Admitting: Family Medicine

## 2023-05-04 VITALS — BP 122/80 | HR 88 | Temp 98.4°F | Ht 72.0 in | Wt 252.5 lb

## 2023-05-04 DIAGNOSIS — J069 Acute upper respiratory infection, unspecified: Secondary | ICD-10-CM | POA: Insufficient documentation

## 2023-05-04 MED ORDER — BENZONATATE 200 MG PO CAPS: 200.0000 mg | ORAL_CAPSULE | Freq: Three times a day (TID) | ORAL | 0 refills | Status: DC | PRN

## 2023-05-04 MED ORDER — ALBUTEROL SULFATE HFA 108 (90 BASE) MCG/ACT IN AERS: 2.0000 | INHALATION_SPRAY | RESPIRATORY_TRACT | 0 refills | Status: DC | PRN

## 2023-05-04 MED ORDER — DOXYCYCLINE HYCLATE 100 MG PO TABS: 100.0000 mg | ORAL_TABLET | Freq: Two times a day (BID) | ORAL | 0 refills | Status: DC

## 2023-05-04 NOTE — Progress Notes (Signed)
Subjective:    Patient ID: Todd Gaines, male    DOB: 1976-02-02, 48 y.o.   MRN: [id number]  HPI  Wt Readings from Last 3 Encounters:  05/04/23 252 lb 8 oz (114.5 kg)  02/01/23 253 lb 9.6 oz (115 kg)  01/05/23 253 lb (114.8 kg)   34.25 kg/m  Vitals:   05/04/23 0844  BP: 122/80  Pulse: 88  Temp: 98.4 F (36.9 C)  SpO2: 94%    Pt presents with uri symptoms for a week  Former smoker 02 sat 94% on RA History of CAD and DM  Fighting this about a week  Nothing is getting better  Runny nose -some color in mucous / green  Congested also  Sinus pressure  Ears are full/hard to pop them   Then hard cough  Chest congestion - green in color  Some wheezing in am  Some shortness of breath -only at night (was worse after being out in cold last night)   Tired Not achy No fever that he knows of   Did not do a covid test       Over the counter    Patient Active Problem List   Diagnosis Date Noted   URI with cough and congestion 05/04/2023   Low testosterone 01/14/2023   Dysphagia 01/05/2023   Decreased libido 01/05/2023   Colon cancer screening 12/09/2021   Type 2 diabetes mellitus with obesity (HCC) 11/18/2021   Hypertension associated with diabetes (HCC) 08/16/2020   Class 1 obesity due to excess calories with serious comorbidity and body mass index (BMI) of 34.0 to 34.9 in adult 04/17/2020   Coronary artery disease involving native coronary artery of native heart without angina pectoris 04/17/2020   Routine general medical examination at a health care facility 01/26/2018   ADHD (attention deficit hyperactivity disorder) 04/08/2016   Hyperlipidemia associated with type 2 diabetes mellitus (HCC) 04/18/2014   Psoriasis 04/18/2014   Past Medical History:  Diagnosis Date   CAD (coronary artery disease)    Diabetes mellitus without complication (HCC)    Elevated cholesterol    Psoriasis    Past Surgical History:  Procedure Laterality Date   CORONARY STENT  INTERVENTION N/A 09/08/2019   Procedure: CORONARY STENT INTERVENTION;  Surgeon: Yvonne Kendall, MD;  Location: [facility] INVASIVE CV LAB;  Service: Cardiovascular;  Laterality: N/A;   LEFT HEART CATH AND CORONARY ANGIOGRAPHY N/A 09/08/2019   Procedure: LEFT HEART CATH AND CORONARY ANGIOGRAPHY;  Surgeon: Yvonne Kendall, MD;  Location: [facility] INVASIVE CV LAB;  Service: Cardiovascular;  Laterality: N/A;   Social History   Tobacco Use   Smoking status: Former    Current packs/day: 0.00    Average packs/day: 0.3 packs/day for 10.0 years (2.5 ttl pk-yrs)    Types: Cigarettes    Start date: 04/17/1992    Quit date: 04/17/2002    Years since quitting: 21.0   Smokeless tobacco: Never  Vaping Use   Vaping status: Never Used  Substance Use Topics   Alcohol use: No    Alcohol/week: 0.0 standard drinks of alcohol   Drug use: No   Family History  Problem Relation Age of Onset   Diabetes Mother    Diabetes Paternal Grandfather    Hypertension Paternal Grandfather    Heart disease Neg Hx    Cancer Neg Hx    No Known Allergies Current Outpatient Medications on File Prior to Visit  Medication Sig Dispense Refill   amphetamine-dextroamphetamine (ADDERALL) 20 MG tablet Take 1 tablet (20  mg total) by mouth 2 (two) times daily. 60 tablet 0   aspirin EC 81 MG tablet Take 81 mg by mouth daily. Swallow whole.     atorvastatin (LIPITOR) 80 MG tablet TAKE 1 TABLET BY MOUTH DAILY 30 tablet 8   clobetasol (TEMOVATE) 0.05 % external solution Apply topically as needed (psoriasis).     empagliflozin (JARDIANCE) 25 MG TABS tablet TAKE 1 TABLET BY MOUTH EVERY DAY BEFORE BREAKFAST 30 tablet 5   famotidine (PEPCID) 20 MG tablet TAKE 1 TABLET BY MOUTH 2 TIMES A DAY 180 tablet 0   glucose blood test strip Use OneTouch Ultra test strips as instructed to check blood sugar 4 times daily. DX:E11.65 150 each 2   Insulin Pen Needle (NOVOTWIST) 32G X 5 MM MISC Use one per day to inject victoza 50 each 3   metFORMIN  (GLUCOPHAGE-XR) 500 MG 24 hr tablet TAKE FOUR TABLETS BY MOUTH DAILY 360 tablet 3   metoprolol tartrate (LOPRESSOR) 25 MG tablet Take 1 tablet (25 mg total) by mouth 2 (two) times daily. Pt needs to schedule appt with provider to avoid missed doses - 1st attempt 60 tablet 0   nitroGLYCERIN (NITROSTAT) 0.4 MG SL tablet Place 1 tablet (0.4 mg total) under the tongue every 5 (five) minutes as needed for chest pain. 20 tablet 12   STELARA 90 MG/ML SOSY injection Inject 90 mg into the skin once. Every 3 months.     testosterone cypionate (DEPOTESTOSTERONE CYPIONATE) 200 MG/ML injection Inject 200 mg into the muscle every 28 (twenty-eight) days.     tirzepatide [facility]) 5 MG/0.5ML Pen Inject 5 mg into the skin once a week. 2 mL 2   No current facility-administered medications on file prior to visit.    Review of Systems  Constitutional:  Positive for fatigue. Negative for appetite change and fever.  HENT:  Positive for congestion, postnasal drip, rhinorrhea and sinus pressure. Negative for ear pain, sneezing, sore throat and trouble swallowing.   Eyes:  Negative for pain and discharge.  Respiratory:  Positive for cough, shortness of breath and wheezing. Negative for stridor.   Cardiovascular:  Negative for chest pain.  Gastrointestinal:  Negative for diarrhea, nausea and vomiting.  Genitourinary:  Negative for frequency, hematuria and urgency.  Musculoskeletal:  Negative for arthralgias and myalgias.  Skin:  Negative for rash.  Neurological:  Negative for dizziness, weakness, light-headedness and headaches.       Had headache Better now   Psychiatric/Behavioral:  Negative for confusion and dysphoric mood.        Objective:   Physical Exam Constitutional:      General: He is not in acute distress.    Appearance: Normal appearance. He is well-developed. He is obese. He is not ill-appearing, toxic-appearing or diaphoretic.  HENT:     Head: Normocephalic and atraumatic.     Comments: Nares  are injected and congested      Right Ear: Tympanic membrane, ear canal and external ear normal.     Left Ear: Tympanic membrane, ear canal and external ear normal.     Nose: Congestion and rhinorrhea present.     Mouth/Throat:     Mouth: Mucous membranes are moist.     Pharynx: Oropharynx is clear. No oropharyngeal exudate or posterior oropharyngeal erythema.     Comments: Clear pnd  Eyes:     General:        Right eye: No discharge.        Left eye: No discharge.  Conjunctiva/sclera: Conjunctivae normal.     Pupils: Pupils are equal, round, and reactive to light.  Cardiovascular:     Rate and Rhythm: Normal rate.     Heart sounds: Normal heart sounds.  Pulmonary:     Effort: Pulmonary effort is normal. No respiratory distress.     Breath sounds: No stridor. Wheezing present. No rhonchi or rales.     Comments: Scant wheeze only on forced expiratoin   No rales  No rhonchi   Not shortness of breath with exertion of speech Chest:     Chest wall: No tenderness.  Musculoskeletal:     Cervical back: Normal range of motion and neck supple.  Lymphadenopathy:     Cervical: No cervical adenopathy.  Skin:    General: Skin is warm and dry.     Capillary Refill: Capillary refill takes less than 2 seconds.     Findings: No rash.  Neurological:     Mental Status: He is alert.     Cranial Nerves: No cranial nerve deficit.  Psychiatric:        Mood and Affect: Mood normal.           Assessment & Plan:   Problem List Items Addressed This Visit       Respiratory   URI with cough and congestion - Primary   Symptoms for a week in prior smoker who is diabetic  Reassuring exam (not enough wheeze to start prednisone)   Some sinus pressure  Pulse ox 94% , not shortness of breath with exertion   In light of history and length of illness , doxycycline prescription  Albuterol mdi  (knows how to use)  Tessalon  Consider prednisone if wheezing worsens  See AVS for symptom care  (mucinex dm and fluids may be most helpful)  Update if not starting to improve in a week or if worsening  Call back and Er precautions noted in detail today

## 2023-05-04 NOTE — Assessment & Plan Note (Signed)
Symptoms for a week in prior smoker who is diabetic  Reassuring exam (not enough wheeze to start prednisone)   Some sinus pressure  Pulse ox 94% , not shortness of breath with exertion   In light of history and length of illness , doxycycline prescription  Albuterol mdi  (knows how to use)  Tessalon  Consider prednisone if wheezing worsens  See AVS for symptom care (mucinex dm and fluids may be most helpful)  Update if not starting to improve in a week or if worsening  Call back and Er precautions noted in detail today

## 2023-05-04 NOTE — Patient Instructions (Addendum)
Drink fluids and rest  mucinex DM is good for cough and congestion  Nasal saline for congestion as needed  Breathe steam  For runny nose an antihistamine like zyrtec would be ok  Tylenol for fever or pain or headache  Please alert [place] if symptoms worsen (if severe or short of breath please go to the ER)    Take doxycycline as directed  Try the inhaler for wheezing (if worse wheeze you may need prednisone)   Tessalon peales for cough also

## 2023-05-29 ENCOUNTER — Other Ambulatory Visit: Payer: Self-pay | Admitting: Internal Medicine

## 2023-05-31 ENCOUNTER — Encounter: Payer: Self-pay | Admitting: Endocrinology

## 2023-05-31 ENCOUNTER — Ambulatory Visit (INDEPENDENT_AMBULATORY_CARE_PROVIDER_SITE_OTHER): Payer: 59 | Admitting: Endocrinology

## 2023-05-31 VITALS — BP 122/70 | HR 97 | Ht 72.0 in | Wt 253.6 lb

## 2023-05-31 DIAGNOSIS — E118 Type 2 diabetes mellitus with unspecified complications: Secondary | ICD-10-CM

## 2023-05-31 DIAGNOSIS — Z7985 Long-term (current) use of injectable non-insulin antidiabetic drugs: Secondary | ICD-10-CM

## 2023-05-31 DIAGNOSIS — Z7984 Long term (current) use of oral hypoglycemic drugs: Secondary | ICD-10-CM

## 2023-05-31 LAB — POCT GLYCOSYLATED HEMOGLOBIN (HGB A1C): Hemoglobin A1C: 6.1 % — AB (ref 4.0–5.6)

## 2023-05-31 MED ORDER — TIRZEPATIDE 7.5 MG/0.5ML ~~LOC~~ SOAJ
7.5000 mg | SUBCUTANEOUS | 4 refills | Status: DC
Start: 2023-05-31 — End: 2023-10-20

## 2023-05-31 NOTE — Patient Instructions (Signed)
 Latest Reference Range & Units 10/16/22 07:55 01/25/23 09:48 05/31/23 13:58  Hemoglobin A1C 4.0 - 5.6 % 6.7 (H) 7.0 (H) 6.1 !  (H): Data is abnormally high !: Data is abnormal

## 2023-05-31 NOTE — Progress Notes (Signed)
 Outpatient Endocrinology Note [place] Makynlie Rossini, MD  05/31/23  Patient's Name: Todd Gaines    DOB: June 14, 1975    MRN: [id number]                                                    REASON OF VISIT: Follow up of type 2 diabetes mellitus  PCP: Tower, Audrie Gallus, MD  HISTORY OF PRESENT ILLNESS:   Todd Gaines is a 48 y.o. old male with past medical history listed below, is here for follow up for type 2 diabetes mellitus.  He has new complaints of lower total testosterone.  Pertinent Diabetes History: Patient was diagnosed with type 2 diabetes mellitus in 2012.  He was started on insulin therapy in 2016.  Chronic Diabetes Complications : Retinopathy: yes, mild non proliferative. Last ophthalmology exam was done on 11/2022, following with ophthalmology regularly. Reviewed note.  Nephropathy: no Peripheral neuropathy: on Coronary artery disease: yes Stroke: no  Relevant comorbidities and cardiovascular risk factors: Obesity: yes Body mass index is 34.39 kg/m.  Hypertension: Yes  Hyperlipidemia : Yes, on statin   Current / Home Diabetic regimen includes: Metformin extended release 2000 mg daily. Mounjaro 5 mg weekly.   Jardiance 25 mg daily.  Prior diabetic medications: V-Go pump stopped due to cost issue.  Basal bolus regimen.  Used to be on OmniPod insulin pump in the past.  Trulicity in the past, was switched to [facility] in April 2024.  Transient nausea from Trulicity.  Glycemic data:   He has not been checking blood sugar at home.  Hypoglycemia: Patient has denies hypoglycemic episodes. Patient has hypoglycemia awareness.  Factors modifying glucose control: 1.  Diabetic diet assessment: 3 meals a day, occasionally eating [facility].  2.  Staying active or exercising: No formal exercise.  3.  Medication compliance: compliant most of the time.  # Low testosterone :  He had low testosterone of 232 with low normal limit of 300 in October as follows, but checked around 2  PM in the afternoon.  This was checked due to fatigue.  Patient has mild decline in libido and also has mild erectile dysfunction.  He has daughter age of 5 in 2024.  No history of opioid intake.  No history of taking androgen /steroid for muscle building.  No history of head trauma or testicular trauma.  Patient was referred to urology for evaluation, he reports he is waiting to schedule visit.   Latest Reference Range & Units 01/12/23 13:58  Testosterone 300.00 - 890.00 ng/dL 865.78 (L)  (L): Data is abnormally low  Repeat testosterone level in the morning including total, free testosterone and creatinine and prolactin were within normal limits as follows.    Latest Reference Range & Units 02/04/23 07:58  Sex Horm Binding Glob, Serum 10 - 50 nmol/L 26  Testosterone 264 - 916 ng/dL 469  Testosterone Free 6.8 - 21.5 pg/mL 12.2   Interval history  Diabetes regimen as reviewed and noted above.  Recent hemoglobin A1c 6.1%.  Lately he said he is eating regular sodas and fast food, daughter has high school sports.  No other complaints today.  He has been tolerating Mounjaro well.  REVIEW OF SYSTEMS As per history of present illness.   PAST MEDICAL HISTORY: Past Medical History:  Diagnosis Date   CAD (coronary artery disease)  Diabetes mellitus without complication (HCC)    Elevated cholesterol    Psoriasis     PAST SURGICAL HISTORY: Past Surgical History:  Procedure Laterality Date   CORONARY STENT INTERVENTION N/A 09/08/2019   Procedure: CORONARY STENT INTERVENTION;  Surgeon: Yvonne Kendall, MD;  Location: [facility] INVASIVE CV LAB;  Service: Cardiovascular;  Laterality: N/A;   LEFT HEART CATH AND CORONARY ANGIOGRAPHY N/A 09/08/2019   Procedure: LEFT HEART CATH AND CORONARY ANGIOGRAPHY;  Surgeon: Yvonne Kendall, MD;  Location: [facility] INVASIVE CV LAB;  Service: Cardiovascular;  Laterality: N/A;    ALLERGIES: No Known Allergies  FAMILY HISTORY:  Family History  Problem Relation Age  of Onset   Diabetes Mother    Diabetes Paternal Grandfather    Hypertension Paternal Grandfather    Heart disease Neg Hx    Cancer Neg Hx     SOCIAL HISTORY: Social History   Socioeconomic History   Marital status: Married    Spouse name: Not on file   Number of children: Not on file   Years of education: Not on file   Highest education level: Not on file  Occupational History   Not on file  Tobacco Use   Smoking status: Former    Current packs/day: 0.00    Average packs/day: 0.3 packs/day for 10.0 years (2.5 ttl pk-yrs)    Types: Cigarettes    Start date: 04/17/1992    Quit date: 04/17/2002    Years since quitting: 21.1   Smokeless tobacco: Never  Vaping Use   Vaping status: Never Used  Substance and Sexual Activity   Alcohol use: No    Alcohol/week: 0.0 standard drinks of alcohol   Drug use: No   Sexual activity: Yes  Other Topics Concern   Not on file  Social History Narrative   Not on file   Social Drivers of Health   Financial Resource Strain: Not on file  Food Insecurity: Not on file  Transportation Needs: Not on file  Physical Activity: Not on file  Stress: Not on file  Social Connections: Not on file    MEDICATIONS:  Current Outpatient Medications  Medication Sig Dispense Refill   albuterol (VENTOLIN HFA) 108 (90 Base) MCG/ACT inhaler Inhale 2 puffs into the lungs every 4 (four) hours as needed for wheezing or shortness of breath. 8 g 0   amphetamine-dextroamphetamine (ADDERALL) 20 MG tablet Take 1 tablet (20 mg total) by mouth 2 (two) times daily. 60 tablet 0   aspirin EC 81 MG tablet Take 81 mg by mouth daily. Swallow whole.     atorvastatin (LIPITOR) 80 MG tablet TAKE 1 TABLET BY MOUTH DAILY 30 tablet 8   clobetasol (TEMOVATE) 0.05 % external solution Apply topically as needed (psoriasis).     empagliflozin (JARDIANCE) 25 MG TABS tablet TAKE 1 TABLET BY MOUTH EVERY DAY BEFORE BREAKFAST 30 tablet 5   famotidine (PEPCID) 20 MG tablet TAKE 1 TABLET BY  MOUTH 2 TIMES A DAY 180 tablet 0   glucose blood test strip Use OneTouch Ultra test strips as instructed to check blood sugar 4 times daily. DX:E11.65 150 each 2   Insulin Pen Needle (NOVOTWIST) 32G X 5 MM MISC Use one per day to inject victoza 50 each 3   metFORMIN (GLUCOPHAGE-XR) 500 MG 24 hr tablet TAKE FOUR TABLETS BY MOUTH DAILY 360 tablet 3   metoprolol tartrate (LOPRESSOR) 25 MG tablet Take 1 tablet (25 mg total) by mouth 2 (two) times daily. Pt needs to schedule appt with provider to  avoid missed doses - 1st attempt 60 tablet 0   nitroGLYCERIN (NITROSTAT) 0.4 MG SL tablet Place 1 tablet (0.4 mg total) under the tongue every 5 (five) minutes as needed for chest pain. 20 tablet 12   STELARA 90 MG/ML SOSY injection Inject 90 mg into the skin once. Every 3 months.     testosterone cypionate (DEPOTESTOSTERONE CYPIONATE) 200 MG/ML injection Inject 200 mg into the muscle every 28 (twenty-eight) days.     tirzepatide (MOUNJARO) 7.5 MG/0.5ML Pen Inject 7.5 mg into the skin once a week. 6 mL 4   No current facility-administered medications for this visit.    PHYSICAL EXAM: Vitals:   05/31/23 1354  BP: 122/70  Pulse: 97  SpO2: 96%  Weight: 253 lb 9.6 oz (115 kg)  Height: 6' (1.829 m)   Body mass index is 34.39 kg/m.  Wt Readings from Last 3 Encounters:  05/31/23 253 lb 9.6 oz (115 kg)  05/04/23 252 lb 8 oz (114.5 kg)  02/01/23 253 lb 9.6 oz (115 kg)    General: Well developed, well nourished male in no apparent distress.  HEENT: AT/[place], no external lesions.  Eyes: Conjunctiva clear and no icterus. Neck: Neck supple  Lungs: Respirations not labored Neurologic: Alert, oriented, normal speech Extremities / Skin: Dry.   Psychiatric: Does not appear depressed or anxious  Diabetic Foot Exam - Simple   No data filed    LABS Reviewed Lab Results  Component Value Date   HGBA1C 6.1 (A) 05/31/2023   HGBA1C 7.0 (H) 01/25/2023   HGBA1C 6.7 (H) 10/16/2022   Lab Results  Component  Value Date   FRUCTOSAMINE 258 08/30/2019   FRUCTOSAMINE 400 (H) 04/20/2018   FRUCTOSAMINE 322 (H) 12/16/2015   Lab Results  Component Value Date   CHOL 90 12/29/2022   HDL 28.60 (L) 12/29/2022   LDLCALC 40 12/29/2022   LDLDIRECT 90.0 06/27/2019   TRIG 104.0 12/29/2022   CHOLHDL 3 12/29/2022   Lab Results  Component Value Date   MICRALBCREAT 1.2 01/25/2023   MICRALBCREAT 1.3 01/07/2022   Lab Results  Component Value Date   CREATININE 0.93 01/25/2023   Lab Results  Component Value Date   GFR 97.75 01/25/2023    ASSESSMENT / PLAN  1. Controlled type 2 diabetes mellitus with complication, without long-term current use of insulin (HCC)     Diabetes Mellitus type 2, complicated by diabetic retinopathy. - Diabetic status / severity: Controlled.  Lab Results  Component Value Date   HGBA1C 6.1 (A) 05/31/2023    - Hemoglobin A1c goal : <7%  Discussed about increasing Mounjaro to have weight loss benefit, help with appetite and also better diabetes control.  - Medications: No change. Metformin extended release 2000 mg daily. Mounjaro 5 mg weekly.  Increase Mounjaro to 7.5 mg weekly. Jardiance 25 mg daily  - Home glucose testing: Advised to check at least in the morning fasting. - Discussed/ Gave Hypoglycemia treatment plan.  # Consult : not required at this time.   # Annual urine for microalbuminuria/ creatinine ratio, no microalbuminuria currently. Last  Lab Results  Component Value Date   MICRALBCREAT 1.2 01/25/2023    # Foot check nightly.  # He has diabetic retinopathy, follow-up with ophthalmology..   - Diet: Make healthy diabetic food choices.  Advised to avoid [facility]. - Life style / activity / exercise: Discussed.  2. Blood pressure  -  BP Readings from Last 1 Encounters:  05/31/23 122/70    - Control is in  target.  - No change in current plans.  3. Lipid status / Hyperlipidemia - Last  Lab Results  Component Value Date   LDLCALC 40  12/29/2022   - Continue atorvastatin 80 mg daily.   Diagnoses and all orders for this visit:  Controlled type 2 diabetes mellitus with complication, without long-term current use of insulin (HCC) -     POCT glycosylated hemoglobin (Hb A1C) -     tirzepatide (MOUNJARO) 7.5 MG/0.5ML Pen; Inject 7.5 mg into the skin once a week.    DISPOSITION Follow up in clinic in 4  months suggested.   All questions answered and patient verbalized understanding of the plan.  [place] Lafawn Lenoir, MD [facility] Endocrinology [facility] [address] [place], [address] [place], [place] [address] Phone # [phone]  At least part of this note was generated using voice recognition software. Inadvertent word errors may have occurred, which were not recognized during the proofreading process.

## 2023-06-09 ENCOUNTER — Other Ambulatory Visit: Payer: Self-pay | Admitting: Family Medicine

## 2023-06-17 ENCOUNTER — Other Ambulatory Visit: Payer: Self-pay | Admitting: Internal Medicine

## 2023-06-21 ENCOUNTER — Telehealth: Payer: Self-pay

## 2023-06-21 ENCOUNTER — Other Ambulatory Visit (HOSPITAL_COMMUNITY): Payer: Self-pay

## 2023-06-21 NOTE — Telephone Encounter (Signed)
 Pharmacy Patient Advocate Encounter   Received notification from CoverMyMeds that prior authorization for [facility] is required/requested.   Insurance verification completed.   The patient is insured through [employer] .   Per test claim: PA required; PA submitted to above mentioned insurance via CoverMyMeds Key/confirmation #/EOC [id number] Status is pending

## 2023-06-22 ENCOUNTER — Other Ambulatory Visit: Payer: Self-pay | Admitting: Internal Medicine

## 2023-06-24 NOTE — Progress Notes (Signed)
 Office Visit Note  Patient: Todd Gaines             Date of Birth: 10/12/1975           MRN: [id number]             PCP: Todd Gaines Referring: Todd Gaines Visit Date: 07/08/2023 Occupation: @GUAROCC @  Subjective:  Total body joint pain   History of Present Illness: Todd Gaines is a 48 y.o. male seen for the evaluation of joint pain and psoriasis.  According the patient his symptoms started with psoriasis in his mid 8s.  He states he developed psoriasis after scraping his left lower extremity.  He was diagnosed with psoriasis by Todd Gaines and was treated with clobetasol topical agents for several years.  In his late 33s he was started on Humira but had an inadequate response.  He was switched to Raptiva which was taken off the market but worked well for him.  About 10 years ago he started Stelara and it has controlled his psoriasis well.  Although he continues to have joint pain and stiffness.  He reports morning stiffness and stiffness after prolonged sitting.  He states the joint pain has increased in the last 2 years.  His right shoulder has been bothersome for the last couple of years.  He had surgery on his right shoulder for labral tear repair in 2023 by Dr. Yvonne Gaines.  At the time he was told that he had a lot of arthritis in his shoulder and he may require future total shoulder replacement.  He complains of discomfort in his cervical spine, thoracic and lumbar spine, shoulders, hands and bilateral trochanteric region.  He denies any history of joint swelling.  There is 1 episode of plantar fasciitis 10 years ago with no recurrence.  There is no history of Achilles tendinitis or uveitis.  He states that he gets psoriasis flare just prior to the next Stelara injection.  He states the psoriasis is mostly on his scalp and his genital region.  There is no family history of psoriasis or psoriatic arthritis.  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  He walks for exercise.  He is right-handed, married, he has 1 child who is in good health.  He drinks alcohol only socially.  He quit smoking in his 65s and is smoked about 1 pack/week for 10 years.    Activities of Daily Living:  Patient reports morning stiffness for 3 minutes.   Patient Denies nocturnal pain.  Difficulty dressing/grooming: Denies Difficulty climbing stairs: Denies Difficulty getting out of chair: Denies Difficulty using hands for taps, buttons, cutlery, and/or writing: Denies  Review of Systems  Constitutional:  Negative for fatigue.  HENT:  Negative for mouth sores and mouth dryness.   Eyes:  Negative for dryness.  Respiratory:  Negative for shortness of breath.   Cardiovascular:  Negative for chest pain and palpitations.  Gastrointestinal:  Negative for blood in stool, constipation and diarrhea.  Endocrine: Negative for increased urination.  Genitourinary:  Negative for involuntary urination.  Musculoskeletal:  Positive for joint pain, joint pain, myalgias, morning stiffness and myalgias. Negative for gait problem, joint swelling, muscle weakness and muscle tenderness.  Skin:  Negative for color change, rash, hair loss and sensitivity to sunlight.  Allergic/Immunologic: Negative for susceptible to infections.  Neurological:  Negative for dizziness and headaches.  Hematological:  Negative for  swollen glands.  Psychiatric/Behavioral:  Negative for depressed mood and sleep disturbance. The patient is not nervous/anxious.     PMFS History:  Patient Active Problem List   Diagnosis Date Noted   URI with cough and congestion 05/04/2023   Low testosterone  01/14/2023   Dysphagia 01/05/2023   Decreased libido 01/05/2023   Colon cancer screening 12/09/2021   Type 2 diabetes mellitus with obesity (HCC) 11/18/2021   Hypertension associated with diabetes (HCC) 08/16/2020   Class 1 obesity due to excess calories with serious  comorbidity and body mass index (BMI) of 34.0 to 34.9 in adult 04/17/2020   Coronary artery disease involving native coronary artery of native heart without angina pectoris 04/17/2020   Routine general medical examination at a health care facility 01/26/2018   ADHD (attention deficit hyperactivity disorder) 04/08/2016   Hyperlipidemia associated with type 2 diabetes mellitus (HCC) 04/18/2014   Psoriasis 04/18/2014    Past Medical History:  Diagnosis Date   CAD (coronary artery disease)    Diabetes mellitus without complication (HCC)    Elevated cholesterol    Psoriasis     Family History  Problem Relation Age of Onset   Diabetes Mother    Diabetes Paternal Grandfather    Hypertension Paternal Grandfather    Heart disease Neg Hx    Cancer Neg Hx    Past Surgical History:  Procedure Laterality Date   CORONARY STENT INTERVENTION N/A 09/08/2019   Procedure: CORONARY STENT INTERVENTION;  Surgeon: Sammy Crisp, Gaines;  Location: [facility] INVASIVE CV LAB;  Service: Cardiovascular;  Laterality: N/A;   LEFT HEART CATH AND CORONARY ANGIOGRAPHY N/A 09/08/2019   Procedure: LEFT HEART CATH AND CORONARY ANGIOGRAPHY;  Surgeon: Sammy Crisp, Gaines;  Location: [facility] INVASIVE CV LAB;  Service: Cardiovascular;  Laterality: N/A;   SHOULDER SURGERY Right    Social History   Social History Narrative   Not on file   Immunization History  Administered Date(s) Administered   Influenza,inj,Quad PF,6+ Mos 04/18/2014, 01/10/2015, 12/06/2017, 02/13/2020, 12/31/2020   Influenza-Unspecified 11/29/2015   Moderna Sars-Covid-2 Vaccination 05/26/2019, 06/26/2019   Pneumococcal Polysaccharide-23 09/28/2014   Tdap 04/08/2016     Objective: Vital Signs: BP 127/76 (BP Location: Left Arm, Patient Position: Sitting, Cuff Size: Normal)   Pulse 89   Resp 15   Ht 6\' 1"  (1.854 m)   Wt 256 lb (116.1 kg)   BMI 33.78 kg/m    Physical Exam Vitals and nursing note reviewed.  Constitutional:      Appearance: He is  well-developed.  HENT:     Head: Normocephalic and atraumatic.  Eyes:     Conjunctiva/sclera: Conjunctivae normal.     Pupils: Pupils are equal, round, and reactive to light.  Cardiovascular:     Rate and Rhythm: Normal rate and regular rhythm.     Heart sounds: Normal heart sounds.  Pulmonary:     Effort: Pulmonary effort is normal.     Breath sounds: Normal breath sounds.  Abdominal:     General: Bowel sounds are normal.     Palpations: Abdomen is soft.  Musculoskeletal:     Cervical back: Normal range of motion and neck supple.  Skin:    General: Skin is warm and dry.     Capillary Refill: Capillary refill takes less than 2 seconds.  Neurological:     Mental Status: He is alert and oriented to person, place, and time.  Psychiatric:        Behavior: Behavior normal.      Musculoskeletal Exam:  Good range of motion of the cervical spine with discomfort.  He had good range of motion of thoracic and lumbar spine.  He had tenderness over lower lumbar region and over the SI joints.  Shoulders were in good range of motion.  He had discomfort range of motion of his right shoulder joint.  Elbow joints and wrist joints in good range of motion.  He had limited extension of bilateral PIP joints and PIP thickening.  No synovitis or dactylitis was noted.  Hip joints with good range of motion.  He had bilateral trochanteric bursitis.  Knee joints were in good range of motion without any warmth swelling or effusion.  There was no tenderness over ankles or MTPs.  No synovitis or dactylitis was noted.  CDAI Exam: CDAI Score: -- Patient Global: --; Provider Global: -- Swollen: --; Tender: -- Joint Exam 07/08/2023   No joint exam has been documented for this visit   There is currently no information documented on the homunculus. Go to the Rheumatology activity and complete the homunculus joint exam.  Investigation: No additional findings.  Imaging: No results found.  Recent Labs: Lab  Results  Component Value Date   WBC 7.5 12/29/2022   HGB 15.3 12/29/2022   PLT 240.0 12/29/2022   NA 139 01/25/2023   K 4.5 01/25/2023   CL 104 01/25/2023   CO2 28 01/25/2023   GLUCOSE 117 (H) 01/25/2023   BUN 13 01/25/2023   CREATININE 0.93 01/25/2023   BILITOT 0.5 12/29/2022   ALKPHOS 83 12/29/2022   AST 16 12/29/2022   ALT 10 12/29/2022   PROT 6.3 12/29/2022   ALBUMIN 4.0 12/29/2022   CALCIUM  9.0 01/25/2023   GFRAA >60 09/09/2019    Speciality Comments: No specialty comments available.  Procedures:  No procedures performed Allergies: Patient has no known allergies.   Assessment / Plan:     Visit Diagnoses: Polyarthralgia-he complains of joint pain and stiffness for the last couple of years.  He complains of discomfort in his cervical, thoracic and lumbar spine.  He has discomfort in his shoulders, hands, trochanteric region.  He denies any history of joint swelling.  Psoriasis - On Stelara and clobetasol solution.  Psoriasis clearance 100% on Stelara per office records.  He states psoriasis mostly on his scalp and genital region and he gets mild flares Raynauds prior to the next dose of Stelara.  High risk medication use - Stelara 90 mg subcu every 3 months prescribed by Dr. Denman Fischer (started 10 years ago per patient).  Previous treatment Humira-inadequate response, Raptiva worked well but was taken off the market in 2009.- Plan: CBC with Differential/Platelet, Comprehensive metabolic panel with GFR, Sedimentation rate, Hepatitis B core antibody, IgM, Hepatitis B surface antigen, Hepatitis C antibody, Serum protein electrophoresis with reflex, IgG, IgA, IgM, QuantiFERON-TB Gold Plus  Chronic right shoulder pain -he has been having discomfort in his both shoulders.  He had right shoulder joint surgery couple of years ago by Dr. Yvonne Gaines for labral tear repair.  He continues to have some discomfort.  Plan: Rheumatoid factor, Cyclic citrul peptide antibody, IgG  Pain in both hands  -he complains of pain and stiffness in his bilateral hands.  Bilateral PIP thickening and limited extension was noted.  No synovitis or dactylitis was noted.  No nail pitting was noted.  Plan: XR Hand 2 View Right, XR Hand 2 View Left.  X-rays of bilateral hands were suggestive of osteoarthritis.  Trochanteric bursitis of both hips-he complains of discomfort in  his hips and had tenderness over bilateral trochanteric region.  Neck pain -he complains of a stiffness in his cervical spine.  He has seen a chiropractor in the past.  Plan: XR Cervical Spine 2 or 3 views.  Cervical spine x-rays showed anterior osteophytes and facet joint arthropathy.  A handout on neck exercises was placed in the AVS.  Chronic midline low back pain without sciatica -he continues to have lower back pain.  He gives history of morning stiffness and discomfort after prolonged sitting.  Plan: XR Lumbar Spine 2-3 Views.  X-rays showed mild spondylosis and facet joint arthropathy.  A handout on back exercises was placed in the AVS.  Chronic SI joint pain -he had tenderness over bilateral SI joints.  Plan: XR Pelvis 1-2 Views.  Bilateral SI joint sclerosis was noted.  Other medical problems are listed as follows:  Coronary artery disease involving native coronary artery of native heart without angina pectoris  Hyperlipidemia associated with type 2 diabetes mellitus (HCC)  Hypertension associated with diabetes (HCC)  Type 2 diabetes mellitus with obesity (HCC)  Gastroesophageal reflux disease with esophagitis without hemorrhage  Low testosterone   Attention deficit hyperactivity disorder (ADHD), combined type  BMI 33.0-33.9,adult  Orders: Orders Placed This Encounter  Procedures   XR Hand 2 View Right   XR Hand 2 View Left   XR Pelvis 1-2 Views   XR Cervical Spine 2 or 3 views   XR Lumbar Spine 2-3 Views   Sedimentation rate   Rheumatoid factor   Cyclic citrul peptide antibody, IgG   Hepatitis B core antibody,  IgM   Hepatitis B surface antigen   Hepatitis C antibody   Serum protein electrophoresis with reflex   IgG, IgA, IgM   No orders of the defined types were placed in this encounter.    Follow-Up Instructions: Return for Polyarthralgia, psoriasis.   Nicholas Bari, Gaines  Note - This record has been created using Animal nutritionist.  Chart creation errors have been sought, but may not always  have been located. Such creation errors do not reflect on  the standard of medical care.

## 2023-06-29 ENCOUNTER — Other Ambulatory Visit: Payer: Self-pay | Admitting: Family Medicine

## 2023-06-29 NOTE — Telephone Encounter (Signed)
 LOV 05/04/23 acute visit  Last refill: listed as historical provider  NOV nothing scheduled

## 2023-06-29 NOTE — Telephone Encounter (Signed)
 I don't prescribe testosterone That will need to come from his specialist that usually does it

## 2023-07-01 NOTE — Telephone Encounter (Signed)
 ERROR

## 2023-07-05 ENCOUNTER — Ambulatory Visit: Admitting: Internal Medicine

## 2023-07-06 ENCOUNTER — Other Ambulatory Visit: Payer: Self-pay | Admitting: Internal Medicine

## 2023-07-08 ENCOUNTER — Ambulatory Visit (INDEPENDENT_AMBULATORY_CARE_PROVIDER_SITE_OTHER)

## 2023-07-08 ENCOUNTER — Ambulatory Visit

## 2023-07-08 ENCOUNTER — Ambulatory Visit: Payer: 59 | Attending: Rheumatology | Admitting: Rheumatology

## 2023-07-08 ENCOUNTER — Encounter: Payer: Self-pay | Admitting: Rheumatology

## 2023-07-08 VITALS — BP 127/76 | HR 89 | Resp 15 | Ht 73.0 in | Wt 256.0 lb

## 2023-07-08 DIAGNOSIS — M545 Low back pain, unspecified: Secondary | ICD-10-CM | POA: Diagnosis not present

## 2023-07-08 DIAGNOSIS — R1319 Other dysphagia: Secondary | ICD-10-CM

## 2023-07-08 DIAGNOSIS — M533 Sacrococcygeal disorders, not elsewhere classified: Secondary | ICD-10-CM

## 2023-07-08 DIAGNOSIS — M79642 Pain in left hand: Secondary | ICD-10-CM

## 2023-07-08 DIAGNOSIS — M79641 Pain in right hand: Secondary | ICD-10-CM

## 2023-07-08 DIAGNOSIS — G8929 Other chronic pain: Secondary | ICD-10-CM

## 2023-07-08 DIAGNOSIS — M25511 Pain in right shoulder: Secondary | ICD-10-CM | POA: Diagnosis not present

## 2023-07-08 DIAGNOSIS — Z79899 Other long term (current) drug therapy: Secondary | ICD-10-CM

## 2023-07-08 DIAGNOSIS — F902 Attention-deficit hyperactivity disorder, combined type: Secondary | ICD-10-CM

## 2023-07-08 DIAGNOSIS — E785 Hyperlipidemia, unspecified: Secondary | ICD-10-CM

## 2023-07-08 DIAGNOSIS — M542 Cervicalgia: Secondary | ICD-10-CM

## 2023-07-08 DIAGNOSIS — I152 Hypertension secondary to endocrine disorders: Secondary | ICD-10-CM

## 2023-07-08 DIAGNOSIS — M7061 Trochanteric bursitis, right hip: Secondary | ICD-10-CM

## 2023-07-08 DIAGNOSIS — Z6834 Body mass index (BMI) 34.0-34.9, adult: Secondary | ICD-10-CM

## 2023-07-08 DIAGNOSIS — K2100 Gastro-esophageal reflux disease with esophagitis, without bleeding: Secondary | ICD-10-CM

## 2023-07-08 DIAGNOSIS — E669 Obesity, unspecified: Secondary | ICD-10-CM

## 2023-07-08 DIAGNOSIS — E1159 Type 2 diabetes mellitus with other circulatory complications: Secondary | ICD-10-CM

## 2023-07-08 DIAGNOSIS — L409 Psoriasis, unspecified: Secondary | ICD-10-CM | POA: Diagnosis not present

## 2023-07-08 DIAGNOSIS — E1169 Type 2 diabetes mellitus with other specified complication: Secondary | ICD-10-CM

## 2023-07-08 DIAGNOSIS — Z6833 Body mass index (BMI) 33.0-33.9, adult: Secondary | ICD-10-CM

## 2023-07-08 DIAGNOSIS — I2510 Atherosclerotic heart disease of native coronary artery without angina pectoris: Secondary | ICD-10-CM

## 2023-07-08 DIAGNOSIS — M2550 Pain in unspecified joint: Secondary | ICD-10-CM

## 2023-07-08 DIAGNOSIS — M7062 Trochanteric bursitis, left hip: Secondary | ICD-10-CM

## 2023-07-08 DIAGNOSIS — R7989 Other specified abnormal findings of blood chemistry: Secondary | ICD-10-CM

## 2023-07-08 MED ORDER — METOPROLOL TARTRATE 25 MG PO TABS: 25.0000 mg | ORAL_TABLET | Freq: Two times a day (BID) | ORAL | 0 refills | Status: DC

## 2023-07-08 NOTE — Patient Instructions (Signed)
Low Back Sprain or Strain Rehab Ask your health care provider which exercises are safe for you. Do exercises exactly as told by your health care provider and adjust them as directed. It is normal to feel mild stretching, pulling, tightness, or discomfort as you do these exercises. Stop right away if you feel sudden pain or your pain gets worse. Do not begin these exercises until told by your health care provider. Stretching and range-of-motion exercises These exercises warm up your muscles and joints and improve the movement and flexibility of your back. These exercises also help to relieve pain, numbness, and tingling. Lumbar rotation  Lie on your back on a firm bed or the floor with your knees bent. Straighten your arms out to your sides so each arm forms a 90-degree angle (right angle) with a side of your body. Slowly move (rotate) both of your knees to one side of your body until you feel a stretch in your lower back (lumbar). Try not to let your shoulders lift off the floor. Hold this position for __________ seconds. Tense your abdominal muscles and slowly move your knees back to the starting position. Repeat this exercise on the other side of your body. Repeat __________ times. Complete this exercise __________ times a day. Single knee to chest  Lie on your back on a firm bed or the floor with both legs straight. Bend one of your knees. Use your hands to move your knee up toward your chest until you feel a gentle stretch in your lower back and buttock. Hold your leg in this position by holding on to the front of your knee. Keep your other leg as straight as possible. Hold this position for __________ seconds. Slowly return to the starting position. Repeat with your other leg. Repeat __________ times. Complete this exercise __________ times a day. Prone extension on elbows  Lie on your abdomen on a firm bed or the floor (prone position). Prop yourself up on your elbows. Use your arms  to help lift your chest up until you feel a gentle stretch in your abdomen and your lower back. This will place some of your body weight on your elbows. If this is uncomfortable, try stacking pillows under your chest. Your hips should stay down, against the surface that you are lying on. Keep your hip and back muscles relaxed. Hold this position for __________ seconds. Slowly relax your upper body and return to the starting position. Repeat __________ times. Complete this exercise __________ times a day. Strengthening exercises These exercises build strength and endurance in your back. Endurance is the ability to use your muscles for a long time, even after they get tired. Pelvic tilt This exercise strengthens the muscles that lie deep in the abdomen. Lie on your back on a firm bed or the floor with your legs extended. Bend your knees so they are pointing toward the ceiling and your feet are flat on the floor. Tighten your lower abdominal muscles to press your lower back against the floor. This motion will tilt your pelvis so your tailbone points up toward the ceiling instead of pointing to your feet or the floor. To help with this exercise, you may place a small towel under your lower back and try to push your back into the towel. Hold this position for __________ seconds. Let your muscles relax completely before you repeat this exercise. Repeat __________ times. Complete this exercise __________ times a day. Alternating arm and leg raises  Get on your hands  and knees on a firm surface. If you are on a hard floor, you may want to use padding, such as an exercise mat, to cushion your knees. Line up your arms and legs. Your hands should be directly below your shoulders, and your knees should be directly below your hips. Lift your left leg behind you. At the same time, raise your right arm and straighten it in front of you. Do not lift your leg higher than your hip. Do not lift your arm higher  than your shoulder. Keep your abdominal and back muscles tight. Keep your hips facing the ground. Do not arch your back. Keep your balance carefully, and do not hold your breath. Hold this position for __________ seconds. Slowly return to the starting position. Repeat with your right leg and your left arm. Repeat __________ times. Complete this exercise __________ times a day. Abdominal set with straight leg raise  Lie on your back on a firm bed or the floor. Bend one of your knees and keep your other leg straight. Tense your abdominal muscles and lift your straight leg up, 4-6 inches (10-15 cm) off the ground. Keep your abdominal muscles tight and hold this position for __________ seconds. Do not hold your breath. Do not arch your back. Keep it flat against the ground. Keep your abdominal muscles tense as you slowly lower your leg back to the starting position. Repeat with your other leg. Repeat __________ times. Complete this exercise __________ times a day. Single leg lower with bent knees Lie on your back on a firm bed or the floor. Tense your abdominal muscles and lift your feet off the floor, one foot at a time, so your knees and hips are bent in 90-degree angles (right angles). Your knees should be over your hips and your lower legs should be parallel to the floor. Keeping your abdominal muscles tense and your knee bent, slowly lower one of your legs so your toe touches the ground. Lift your leg back up to return to the starting position. Do not hold your breath. Do not let your back arch. Keep your back flat against the ground. Repeat with your other leg. Repeat __________ times. Complete this exercise __________ times a day. Posture and body mechanics Good posture and healthy body mechanics can help to relieve stress in your body's tissues and joints. Body mechanics refers to the movements and positions of your body while you do your daily activities. Posture is part of body  mechanics. Good posture means: Your spine is in its natural S-curve position (neutral). Your shoulders are pulled back slightly. Your head is not tipped forward (neutral). Follow these guidelines to improve your posture and body mechanics in your everyday activities. Standing  When standing, keep your spine neutral and your feet about hip-width apart. Keep a slight bend in your knees. Your ears, shoulders, and hips should line up. When you do a task in which you stand in one place for a long time, place one foot up on a stable object that is 2-4 inches (5-10 cm) high, such as a footstool. This helps keep your spine neutral. Sitting  When sitting, keep your spine neutral and keep your feet flat on the floor. Use a footrest, if necessary, and keep your thighs parallel to the floor. Avoid rounding your shoulders, and avoid tilting your head forward. When working at a desk or a computer, keep your desk at a height where your hands are slightly lower than your elbows. Slide your  chair under your desk so you are close enough to maintain good posture. When working at a computer, place your monitor at a height where you are looking straight ahead and you do not have to tilt your head forward or downward to look at the screen. Resting When lying down and resting, avoid positions that are most painful for you. If you have pain with activities such as sitting, bending, stooping, or squatting, lie in a position in which your body does not bend very much. For example, avoid curling up on your side with your arms and knees near your chest (fetal position). If you have pain with activities such as standing for a long time or reaching with your arms, lie with your spine in a neutral position and bend your knees slightly. Try the following positions: Lying on your side with a pillow between your knees. Lying on your back with a pillow under your knees. Lifting  When lifting objects, keep your feet at least  shoulder-width apart and tighten your abdominal muscles. Bend your knees and hips and keep your spine neutral. It is important to lift using the strength of your legs, not your back. Do not lock your knees straight out. Always ask for help to lift heavy or awkward objects. This information is not intended to replace advice given to you by your health care provider. Make sure you discuss any questions you have with your health care provider. Document Revised: 07/06/2022 Document Reviewed: 05/20/2020 [employer] Patient Education  2024 [employer]  Cervical Strain and Sprain Rehab Ask your health care provider which exercises are safe for you. Do exercises exactly as told by your health care provider and adjust them as directed. It is normal to feel mild stretching, pulling, tightness, or discomfort as you do these exercises. Stop right away if you feel sudden pain or your pain gets worse. Do not begin these exercises until told by your health care provider. Stretching and range-of-motion exercises Cervical side bending  Using good posture, sit on a stable chair or stand up. Without moving your shoulders, slowly tilt your left / right ear to your shoulder until you feel a stretch in the neck muscles on the opposite side. You should be looking straight ahead. Hold for __________ seconds. Repeat with the other side of your neck. Repeat __________ times. Complete this exercise __________ times a day. Cervical rotation  Using good posture, sit on a stable chair or stand up. Slowly turn your head to the side as if you are looking over your left / right shoulder. Keep your eyes level with the ground. Stop when you feel a stretch along the side and the back of your neck. Hold for __________ seconds. Repeat this by turning to your other side. Repeat __________ times. Complete this exercise __________ times a day. Thoracic extension and pectoral stretch  Roll a towel or a small blanket so it is  about 4 inches (10 cm) in diameter. Lie down on your back on a firm surface. Put the towel in the middle of your back across your spine. It should not be under your shoulder blades. Put your hands behind your head and let your elbows fall out to your sides. Hold for __________ seconds. Repeat __________ times. Complete this exercise __________ times a day. Strengthening exercises Upper cervical flexion  Lie on your back with a thin pillow behind your head or a small, rolled-up towel under your neck. Gently tuck your chin toward your chest and nod  your head down to look toward your feet. Do not lift your head off the pillow. Hold for __________ seconds. Release the tension slowly. Relax your neck muscles completely before you repeat this exercise. Repeat __________ times. Complete this exercise __________ times a day. Cervical extension  Stand about 6 inches (15 cm) away from a wall, with your back facing the wall. Place a soft object, about 6-8 inches (15-20 cm) in diameter, between the back of your head and the wall. A soft object could be a small pillow, a ball, or a folded towel. Gently tilt your head back and press into the soft object. Keep your jaw and forehead relaxed. Hold for __________ seconds. Release the tension slowly. Relax your neck muscles completely before you repeat this exercise. Repeat __________ times. Complete this exercise __________ times a day. Posture and body mechanics Body mechanics refer to the movements and positions of your body while you do your daily activities. Posture is part of body mechanics. Good posture and healthy body mechanics can help to relieve stress in your body's tissues and joints. Good posture means that your spine is in its natural S-curve position (your spine is neutral), your shoulders are pulled back slightly, and your head is not tipped forward. The following are general guidelines for using improved posture and body mechanics in your  everyday activities. Sitting  When sitting, keep your spine neutral and keep your feet flat on the floor. Use a footrest, if needed, and keep your thighs parallel to the floor. Avoid rounding your shoulders. Avoid tilting your head forward. When working at a desk or a computer, keep your desk at a height where your hands are slightly lower than your elbows. Slide your chair under your desk so you are close enough to maintain good posture. When working at a computer, place your monitor at a height where you are looking straight ahead and you do not have to tilt your head forward or downward to look at the screen. Standing  When standing, keep your spine neutral and keep your feet about hip-width apart. Keep a slight bend in your knees. Your ears, shoulders, and hips should line up. When you do a task in which you stand in one place for a long time, place one foot up on a stable object that is 2-4 inches (5-10 cm) high, such as a footstool. This helps keep your spine neutral. Resting When lying down and resting, avoid positions that are most painful for you. Try to support your neck in a neutral position. You can use a contour pillow or a small rolled-up towel. Your pillow should support your neck but not push on it. This information is not intended to replace advice given to you by your health care provider. Make sure you discuss any questions you have with your health care provider. Document Revised: 07/06/2022 Document Reviewed: 09/22/2021 [employer] Patient Education  2024 [employer].

## 2023-07-10 LAB — CYCLIC CITRUL PEPTIDE ANTIBODY, IGG: Cyclic Citrullin Peptide Ab: <16 U

## 2023-07-10 LAB — IGG, IGA, IGM
IgG (Immunoglobin G), Serum: 1006 mg/dL (ref 600–1640)
IgM, Serum: 103 mg/dL (ref 50–300)
Immunoglobulin A: 260 mg/dL (ref 47–310)

## 2023-07-10 LAB — PROTEIN ELECTROPHORESIS, SERUM, WITH REFLEX
Albumin ELP: 4 g/dL (ref 3.8–4.8)
Alpha 1: 0.3 g/dL (ref 0.2–0.3)
Alpha 2: 0.6 g/dL (ref 0.5–0.9)
Beta 2: 0.4 g/dL (ref 0.2–0.5)
Beta Globulin: 0.5 g/dL (ref 0.4–0.6)
Gamma Globulin: 0.9 g/dL (ref 0.8–1.7)
Total Protein: 6.7 g/dL (ref 6.1–8.1)

## 2023-07-10 LAB — RHEUMATOID FACTOR: Rheumatoid fact SerPl-aCnc: <10 [IU]/mL (ref <14)

## 2023-07-10 LAB — HEPATITIS B SURFACE ANTIGEN: Hepatitis B Surface Ag: NONREACTIVE

## 2023-07-10 LAB — HEPATITIS B CORE ANTIBODY, IGM: Hep B C IgM: NONREACTIVE

## 2023-07-10 LAB — SEDIMENTATION RATE: Sed Rate: 2 mm/h (ref 0–15)

## 2023-07-10 LAB — HEPATITIS C ANTIBODY: Hepatitis C Ab: NONREACTIVE

## 2023-07-12 NOTE — Progress Notes (Signed)
All the lab results are within normal limits.  I will discuss results at the follow-up visit.

## 2023-07-22 NOTE — Progress Notes (Signed)
 Office Visit Note  Patient: Todd Gaines             Date of Birth: [birth year]           MRN: [id number]             PCP: Clemens Curt, MD Referring: Tower, Manley Seeds, MD Visit Date: 08/05/2023 Occupation: @GUAROCC @  Subjective:  Pain in multiple joints and psoriasis  History of Present Illness: EARON RIVEST is a 48 y.o. male with psoriasis and polyarthralgia.  He returns today for the follow-up visit.  According the patient he continues to have significant morning stiffness mostly in the SI joints.  He also has some neck and lower back pain.  He continues to have discomfort in the trochanteric region.  He has discomfort and ongoing pain in his bilateral shoulders and hands.  He has not noticed any joint swelling.  He continues to have psoriasis flares prior to next Stelara injection.  He denies any recent flares of plantar fasciitis.  He states he has to stretch his plantar fascia frequently.  He denies any episodes of Achilles tendinitis.    Activities of Daily Living:  Patient reports morning stiffness for 5 minutes.   Patient Denies nocturnal pain.  Difficulty dressing/grooming: Denies Difficulty climbing stairs: Denies Difficulty getting out of chair: Denies Difficulty using hands for taps, buttons, cutlery, and/or writing: Denies  Review of Systems  Constitutional:  Negative for fatigue.  HENT:  Negative for mouth sores and mouth dryness.   Eyes:  Negative for dryness.  Respiratory:  Negative for shortness of breath.   Cardiovascular:  Negative for chest pain and palpitations.  Gastrointestinal:  Negative for blood in stool, constipation and diarrhea.  Endocrine: Negative for increased urination.  Genitourinary:  Negative for involuntary urination.  Musculoskeletal:  Positive for joint pain, joint pain, myalgias, morning stiffness and myalgias. Negative for gait problem, joint swelling, muscle weakness and muscle tenderness.  Skin:  Negative for color change, rash,  hair loss and sensitivity to sunlight.  Allergic/Immunologic: Negative for susceptible to infections.  Neurological:  Negative for dizziness and headaches.  Hematological:  Negative for swollen glands.  Psychiatric/Behavioral:  Negative for depressed mood and sleep disturbance. The patient is not nervous/anxious.     PMFS History:  Patient Active Problem List   Diagnosis Date Noted   URI with cough and congestion 05/04/2023   Low testosterone  01/14/2023   Dysphagia 01/05/2023   Decreased libido 01/05/2023   Colon cancer screening 12/09/2021   Type 2 diabetes mellitus with obesity (HCC) 11/18/2021   Hypertension associated with diabetes (HCC) 08/16/2020   Class 1 obesity due to excess calories with serious comorbidity and body mass index (BMI) of 34.0 to 34.9 in adult 04/17/2020   Coronary artery disease involving native coronary artery of native heart without angina pectoris 04/17/2020   Routine general medical examination at a health care facility 01/26/2018   ADHD (attention deficit hyperactivity disorder) 04/08/2016   Hyperlipidemia associated with type 2 diabetes mellitus (HCC) 04/18/2014   Psoriasis 04/18/2014    Past Medical History:  Diagnosis Date   CAD (coronary artery disease)    Diabetes mellitus without complication (HCC)    Elevated cholesterol    Psoriasis     Family History  Problem Relation Age of Onset   Diabetes Mother    Healthy Father    Healthy Brother    Diabetes Paternal Grandfather    Hypertension Paternal Grandfather    Hypertension Daughter  Thyroid  disease Daughter    Heart disease Neg Hx    Cancer Neg Hx    Past Surgical History:  Procedure Laterality Date   CORONARY STENT INTERVENTION N/A 09/08/2019   Procedure: CORONARY STENT INTERVENTION;  Surgeon: Sammy Crisp, MD;  Location: [facility]IVE [facility];  Service: Cardiovascular;  Laterality: N/A;   LEFT HEART CATH AND CORONARY ANGIOGRAPHY N/A 09/08/2019   Procedure: LEFT HEART CATH AND  CORONARY ANGIOGRAPHY;  Surgeon: Sammy Crisp, MD;  Location: [facility] INVASIVE CV [facility];  Service: Cardiovascular;  Laterality: N/A;   SHOULDER SURGERY Right    Social History   Social History Narrative   Not on file   Immunization History  Administered Date(s) Administered   Influenza,inj,Quad PF,6+ Mos 04/18/2014, 01/10/2015, 12/06/2017, 02/13/2020, 12/31/2020   Influenza-Unspecified 11/29/2015   Moderna Sars-Covid-2 Vaccination 05/26/2019, 06/26/2019   Pneumococcal Polysaccharide-23 09/28/2014   Tdap 04/08/2016     Objective: Vital Signs: BP 120/79 (BP Location: Left Arm, Patient Position: Sitting, Cuff Size: Normal)   Pulse 88   Resp 17   Ht 6\' 1"  (1.854 m)   Wt 250 lb 3.2 oz (113.5 kg)   BMI 33.01 kg/m    Physical Exam Vitals and nursing note reviewed.  Constitutional:      Appearance: He is well-developed.  HENT:     Head: Normocephalic and atraumatic.  Eyes:     Conjunctiva/sclera: Conjunctivae normal.     Pupils: Pupils are equal, round, and reactive to light.  Cardiovascular:     Rate and Rhythm: Normal rate and regular rhythm.     Heart sounds: Normal heart sounds.  Pulmonary:     Effort: Pulmonary effort is normal.     Breath sounds: Normal breath sounds.  Abdominal:     General: Bowel sounds are normal.     Palpations: Abdomen is soft.  Musculoskeletal:     Cervical back: Normal range of motion and neck supple.  Skin:    General: Skin is warm and dry.     Capillary Refill: Capillary refill takes less than 2 seconds.  Neurological:     Mental Status: He is alert and oriented to person, place, and time.  Psychiatric:        Behavior: Behavior normal.      Musculoskeletal Exam: Cervical spine was in good range of motion with some discomfort.  He had good range of motion of thoracic and lumbar spine.  He had tenderness over lower lumbar region and over bilateral SI joints.  Shoulder joints with good range of motion with discomfort.  Elbow joints with good  range of motion without any warmth swelling or effusion.  There was no tenderness over wrist joints or MCP joints.  He had limited extension of his PIP joints.  Bilateral PIP and DIP thickening with no synovitis or dactylitis was noted.  Hip joints and knee joints in good range of motion.  He had tenderness over bilateral trochanteric bursitis.  Knee joints in good range of motion without any warmth swelling or effusion.  There was no tenderness over ankles or MTPs.  No plantar fasciitis or Achilles tendinitis was noted.  CDAI Exam: CDAI Score: -- Patient Global: --; Provider Global: -- Swollen: --; Tender: -- Joint Exam 08/05/2023   No joint exam has been documented for this visit   There is currently no information documented on the homunculus. Go to the Rheumatology activity and complete the homunculus joint exam.  Investigation: No additional findings.  Imaging: XR Lumbar Spine 2-3 Views Result  Date: 07/08/2023 No significant disc space narrowing was noted.  Anterior osteophytes were noted in the thoracic and upper lumbar region.  Facet joint arthropathy was noted. Impression: These findings suggestive of mild spondylosis and facet joint arthropathy.  XR Pelvis 1-2 Views Result Date: 07/08/2023 Bilateral SI joint sclerosis was noted.  No narrowing or erosive changes were noted. Impression: These findings could be suggestive of osteoarthritis or psoriatic arthritis.  XR Hand 2 View Left Result Date: 07/08/2023 CMC, PIP and DIP narrowing was noted.  No MCP, intercarpal or radiocarpal joint space narrowing was noted.  No erosive changes were noted. Impression: These findings suggestive of osteoarthritis of the hand.  XR Hand 2 View Right Result Date: 07/08/2023 CMC, PIP and DIP narrowing was noted.  No MCP, intercarpal or radiocarpal joint space narrowing was noted.  No erosive changes were noted. Impression: These findings suggestive of osteoarthritis of the hand.  XR Cervical Spine 2  or 3 views Result Date: 07/08/2023 No significant disc space narrowing was noted.  Anterior osteophytes and facet joint arthropathy was noted. Impression: These findings suggestive of multilevel spondylosis and facet joint arthropathy.   Recent Labs: Lab Results  Component Value Date   WBC 7.5 12/29/2022   HGB 15.3 12/29/2022   PLT 240.0 12/29/2022   NA 139 01/25/2023   K 4.5 01/25/2023   CL 104 01/25/2023   CO2 28 01/25/2023   GLUCOSE 117 (H) 01/25/2023   BUN 13 01/25/2023   CREATININE 0.93 01/25/2023   BILITOT 0.5 12/29/2022   ALKPHOS 83 12/29/2022   AST 16 12/29/2022   ALT 10 12/29/2022   PROT 6.7 07/08/2023   ALBUMIN 4.0 12/29/2022   CALCIUM  9.0 01/25/2023   GFRAA >60 09/09/2019   July 08, 2023 SPEP normal, immunoglobulins normal, sed rate 2, RF negative, anti-CCP negative, hepatitis B nonreactive, hepatitis C nonreactive  Speciality Comments: No specialty comments available.  Procedures:  No procedures performed Allergies: Patient has no known allergies.   Assessment / Plan:     Visit Diagnoses: Psoriatic arthritis (HCC) - Pain in cervical, thoracic and lumbar spine.  Discomfort in shoulders, hands, chronic SI joint pain and trochanteric bursitis.  Patient is history of significant morning stiffness especially in the SI joints.  These findings suggestive of sacroiliitis.  Bilateral SI joint sclerosis was noted on the x-rays.  He is also having flares of psoriasis prior to next Stelara dose.  Different treatment options and side effects were discussed.  Patient would like to proceed with Skyrizi.  Handout was given and consent was taken.  Will apply for [employer]. Counseled patient that Skyrizi is a IL-23 inhibitor.  Counseled patient on purpose, proper use, and adverse effects of Skyrizi.  Reviewed the most common adverse effects including infection, URTIs, headache, fatigue, tinea infections, and injection site. Counseled patient that Skyrizi should be held for infection and  prior to scheduled surgery.  Recommend annual influenza, PCV 15 or PCV20 or Pneumovax 23, and Shingrix as indicated. Advised that live vaccines should be avoided while taking Skyrizi.  Reviewed the importance of regular labs while on Skyrizi therapy.  Will monitor CBC and CMP 1 month after starting and then every 3 months routinely thereafter. Will monitor TB gold annually. Standing orders placed.  Provided patient with medication education material and answered all questions.  Patient voiced understanding.  Patient consented to [place].  Will upload consent into the media tab.  Reviewed storage instructions of Skyrizi.  Advised initial injection must be administered in office.  Patient  verbalized understanding.  Will apply for Skyrizi through [employer] and update when we receive a response.  Dose will be Skyrizi 150 mg subcuteanously at week 0, week 4 then every 12 weeks thereafter.  Prescription pending lab results and insurance approval.   Psoriasis - On Stelara and clobetasol solution.  The skin cleared up on Stelara.  He gets flares prior to the next Stelara dose.  High risk medication use -I advised him to stop his Stelara.  His last Stelara injection was on a month ago.  Stelara 90 mg subcu every 3 months prescribed by Dr. Del Favia 2015.  Inadequate response to Humira.  Raptiva worked well but  was taken off the market.  Patient had recent CBC and CMP with his PCPs office.  Will get the results.  He also had TB Gold with his PCP.  Sacroiliitis (HCC) -he continues to have discomfort in bilateral SI joints.  Bilateral SI joint sclerosis was noted on the x-rays.  Trochanteric bursitis of both hips - Patient had tenderness over bilateral trochanteric region.  IT band stretches were discussed.  Chronic right shoulder pain -he has chronic pain in his shoulders.  He had painful range of motion today.  S/p right labral tear repair by Dr. Yvonne Hering 2 years ago  Pain in both hands - Clinical and  radiographic findings suggestive of osteoarthritis.  Joint protection muscle strengthening was discussed.  Neck pain - X-rays obtained at the last visit showed anterior osteophytes and facet joint arthropathy.  X-ray findings were reviewed with the patient.  Patient is followed by a chiropractor.  Chronic midline low back pain without sciatica - X-rays obtained at the last visit were suggestive of mild spondylosis and facet joint arthropathy.  X-ray findings reviewed with the patient.  Patient continues to have chronic back pain.  Hypertension associated with diabetes (HCC)-blood pressure was normal today at 120/79.  Other medical problems listed as follows:  Coronary artery disease involving native coronary artery of native heart without angina pectoris  Hyperlipidemia associated with type 2 diabetes mellitus (HCC)  Type 2 diabetes mellitus with obesity (HCC)  Gastroesophageal reflux disease with esophagitis without hemorrhage  Low testosterone   Attention deficit hyperactivity disorder (ADHD), combined type  BMI 33.0-33.9,adult  Orders: Orders Placed This Encounter  Procedures   CBC with Differential/Platelet   Comprehensive metabolic panel with GFR   QuantiFERON-TB Gold Plus   No orders of the defined types were placed in this encounter.   Face-to-face time spent with patient was over 30  minutes. Greater than 50% of time was spent in counseling and coordination of care.  Follow-Up Instructions: Return for Psoriatic arthritis.   Nicholas Bari, MD  Note - This record has been created using Animal nutritionist.  Chart creation errors have been sought, but may not always  have been located. Such creation errors do not reflect on  the standard of medical care.

## 2023-07-23 NOTE — Telephone Encounter (Signed)
 Pharmacy Patient Advocate Encounter  Received notification from [employer] that Prior Authorization for Mounjaro  has been APPROVED through 06/20/2024   PA #/Case ID/Reference #: [id number]

## 2023-07-29 ENCOUNTER — Encounter: Payer: Self-pay | Admitting: Cardiology

## 2023-07-29 ENCOUNTER — Ambulatory Visit: Attending: Cardiology | Admitting: Cardiology

## 2023-07-29 VITALS — BP 116/78 | HR 96 | Ht 73.0 in | Wt 252.8 lb

## 2023-07-29 DIAGNOSIS — E785 Hyperlipidemia, unspecified: Secondary | ICD-10-CM

## 2023-07-29 DIAGNOSIS — I77810 Thoracic aortic ectasia: Secondary | ICD-10-CM | POA: Diagnosis not present

## 2023-07-29 DIAGNOSIS — E1159 Type 2 diabetes mellitus with other circulatory complications: Secondary | ICD-10-CM | POA: Diagnosis not present

## 2023-07-29 DIAGNOSIS — E1169 Type 2 diabetes mellitus with other specified complication: Secondary | ICD-10-CM

## 2023-07-29 DIAGNOSIS — I152 Hypertension secondary to endocrine disorders: Secondary | ICD-10-CM

## 2023-07-29 DIAGNOSIS — I2510 Atherosclerotic heart disease of native coronary artery without angina pectoris: Secondary | ICD-10-CM | POA: Diagnosis not present

## 2023-07-29 NOTE — Patient Instructions (Addendum)
 Medication Instructions:   Your physician recommends that you continue on your current medications as directed. Please refer to the Current Medication list given to you today.  *If you need a refill on your cardiac medications before your next appointment, please call your pharmacy*   Testing/Procedures:  Your physician has requested that you have an echocardiogram. Echocardiography is a painless test that uses sound waves to create images of your heart. It provides your doctor with information about the size and shape of your heart and how well your heart's chambers and valves are working. This procedure takes approximately one hour. There are no restrictions for this procedure. Please do NOT wear cologne, perfume, aftershave, or lotions (deodorant is allowed). Please arrive 15 minutes prior to your appointment time.  Please note: We ask at that you not bring children with you during ultrasound (echo/ vascular) testing. Due to room size and safety concerns, children are not allowed in the ultrasound rooms during exams. Our front office staff cannot provide observation of children in our lobby area while testing is being conducted. An adult accompanying a patient to their appointment will only be allowed in the ultrasound room at the discretion of the ultrasound technician under special circumstances. We apologize for any inconvenience.   Follow-Up: At [facility], you and your health needs are our priority.  As part of our continuing mission to provide you with exceptional heart care, our providers are all part of one team.  This team includes your primary Cardiologist (physician) and Advanced Practice Providers or APPs (Physician Assistants and Nurse Practitioners) who all work together to provide you with the care you need, when you need it.  Your next appointment:   6 month(s)  Provider:   Jann Melody, MD

## 2023-07-29 NOTE — Progress Notes (Signed)
 Cardiology Office Note:   Date:  07/29/2023  ID:  Todd Gaines, DOB 1975/06/18, MRN [id number] PCP: Clemens Curt, MD  [facility] Providers Cardiologist:  Jann Melody, MD    History of Present Illness:   Discussed the use of AI scribe software for clinical note transcription with the patient, who gave verbal consent to proceed.  History of Present Illness Todd Gaines is a 48 year old male with coronary artery disease, diabetes, and hypertension who presents for a routine cardiovascular follow-up.  He has a history of coronary artery disease with an NSTEMI in 2021. Since his last visit, he has not experienced recurrent chest discomfort or shortness of breath. 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 There have been no changes in exertional tolerance, and he has not needed to use nitroglycerin .  He has diabetes and is currently on Jardiance  along with Metformin  and Mounjaro , which effectively manages his blood sugar levels. His last A1c was 6.1 in March. He also takes metoprolol  tartrate 25 mg twice a day regularly.  He was recently started on testosterone  about a month and a half ago. He has been losing weight gradually, approximately 13 pounds since last year on Mounjaro , and is satisfied with the slow and steady progress. He occasionally takes Adderall as needed, primarily for work-related tasks.  No swelling in the legs, dizziness, or lightheadedness. No issues with his current medications.  Today patient denies chest pain, shortness of breath, lower extremity edema, fatigue, palpitations, melena, hematuria, hemoptysis, diaphoresis, weakness, presyncope, syncope, orthopnea, and PND.   Studies Reviewed:    EKG:   EKG Interpretation Date/Time:  Thursday Jul 29 2023 14:03:12 EDT Ventricular Rate:  96 PR Interval:  142 QRS Duration:  74 QT Interval:  320 QTC  Calculation: 404 R Axis:   15  Text Interpretation: Normal sinus rhythm Minimal voltage criteria for LVH, may be normal variant ( R in aVL ) T wave abnormality, consider inferior ischemia Nonspecific T wave abnormality now evident in Lateral leads Confirmed by Leala Prince [phone]) on 07/29/2023 2:14:38 PM    07/29/2020 TTE  IMPRESSIONS     1. Left ventricular ejection fraction, by estimation, is 60 to 65%. The  left ventricle has normal function. The left ventricle has no regional  wall motion abnormalities. There is mild left ventricular hypertrophy.  Left ventricular diastolic parameters  were normal. The average left ventricular global longitudinal strain is  -23.0 %. The global longitudinal strain is normal.   2. Right ventricular systolic function is normal. The right ventricular  size is normal.   3. The mitral valve is normal in structure. No evidence of mitral valve  regurgitation. No evidence of mitral stenosis.   4. The aortic valve is normal in structure. Aortic valve regurgitation is  not visualized. No aortic stenosis is present.   5. The inferior vena cava is normal in size with greater than 50%  respiratory variability, suggesting right atrial pressure of 3 mmHg.   Comparison(s): 09/08/19 EF 55-60%.   FINDINGS   Left Ventricle: Left ventricular ejection fraction, by estimation, is 60  to 65%. The left ventricle has normal function. The left ventricle has no  regional wall motion abnormalities. The average left ventricular global  longitudinal strain is -23.0 %.  The global longitudinal strain is normal. The left ventricular internal  cavity size was normal in size. There is mild left ventricular  hypertrophy. Left ventricular diastolic parameters were normal.   Right Ventricle: The right ventricular size is normal. No increase in  right ventricular wall thickness. Right ventricular systolic function is  normal.   Left Atrium: Left atrial size was normal in size.    Right Atrium: Right atrial size was normal in size.   Pericardium: There is no evidence of pericardial effusion.   Mitral Valve: The mitral valve is normal in structure. No evidence of  mitral valve regurgitation. No evidence of mitral valve stenosis.   Tricuspid Valve: The tricuspid valve is normal in structure. Tricuspid  valve regurgitation is not demonstrated. No evidence of tricuspid  stenosis.   Aortic Valve: The aortic valve is normal in structure. Aortic valve  regurgitation is not visualized. No aortic stenosis is present.   Pulmonic Valve: The pulmonic valve was normal in structure. Pulmonic valve  regurgitation is not visualized. No evidence of pulmonic stenosis.   Aorta: The aortic root is normal in size and structure.   Venous: The inferior vena cava is normal in size with greater than 50%  respiratory variability, suggesting right atrial pressure of 3 mmHg.   IAS/Shunts: No atrial level shunt detected by color flow Doppler.   09/08/19 [facility] Conclusions: Two-vessel coronary artery disease with 60% distal LCx stenosis and thrombotic occlusion of the distal RCA. Mildly elevated left ventricular filling pressure (LVEDP ~20 mmHg). Successful PCI to distal RCA using resolute Onyx 3.0 x 38 mm drug-eluting stent with 0% residual stenosis and TIMI-3 flow.   Recommendations: Dual antiplatelet therapy with aspirin  and prasugrel  for 12 months. Aggressive secondary prevention. Favor medical therapy of moderate distal LCx disease.  If Mr. Lawver has recurrent chest pain, functional assessment of this lesion and PCI, if hemodynamically significant, will need to be considered. Diagnostic Dominance: Right  Intervention    Risk Assessment/Calculations:              Physical Exam:   VS:  BP 116/78   Pulse 96   Ht 6\' 1"  (1.854 m)   Wt 252 lb 12.8 oz (114.7 kg)   SpO2 95%   BMI 33.35 kg/m    Wt Readings from Last 3 Encounters:  07/29/23 252 lb 12.8 oz (114.7 kg)   07/08/23 256 lb (116.1 kg)  05/31/23 253 lb 9.6 oz (115 kg)     Physical Exam Vitals reviewed.  Constitutional:      Appearance: Normal appearance.  HENT:     Head: Normocephalic.  Eyes:     Pupils: Pupils are equal, round, and reactive to light.  Cardiovascular:     Rate and Rhythm: Normal rate and regular rhythm.     Pulses: Normal pulses.     Heart sounds: Normal heart sounds.  Pulmonary:     Effort: Pulmonary effort is normal.     Breath sounds: Normal breath sounds.  Abdominal:     General: Abdomen is flat.     Palpations: Abdomen is soft.  Musculoskeletal:     Right lower leg: No edema.     Left lower leg: No edema.  Skin:    General: Skin is warm and dry.     Capillary Refill: Capillary refill takes less than 2 seconds.  Neurological:     General: No focal deficit present.     Mental Status: He is alert and oriented to person, place, and time.  Psychiatric:        Mood and Affect: Mood normal.        Behavior:  Behavior normal.        Thought Content: Thought content normal.        Judgment: Judgment normal.      ASSESSMENT AND PLAN:    Assessment & Plan Coronary artery disease Coronary artery disease with NSTEMI in 2021, stenting to RCA with residual distal LCX stenosis of 60%. No recurrent chest discomfort or ischemic symptoms. EKG today shows new nonspecific T wave inversions in inferior and low lateral leads. - Discussed with Dr. Paulita Boss. In absence of symptoms, will monitor. Patient asked to report symptoms such as chest discomfort or dyspnea. Consider PET stress test if symptoms develop. - Continue metoprolol  tartrate 25 mg twice daily. - Advised patient to discuss with urologist regarding testosterone  therapy risks in context of coronary artery disease. - Re-evaluate EKG in 6 months.  Hyperlipidemia  LDL cholesterol is well-controlled at 40 mg/dL, below the guideline of 55 mg/dL for multi-vessel disease.  - Continue Atorvastatin  80mg   Dilated  aortic root Noted on 2021 echo, 40mm. Repeat study in 2022 with normal appearing aortic root. - Repeat echo for serial monitoring.  Type 2 diabetes mellitus Type 2 diabetes mellitus well-controlled with A1c of 6.1% as of March.  - Continue follow up with PCP  Hypertension Blood pressure well-controlled at 116/78 mmHg.  - Continue Metoprolol  Tartrate 25mg  BID  Follow-up Plan to monitor EKG changes and symptoms. - Schedule follow-up appointment in 6 months with Dr. Paulita Boss for repeat EKG. - Report any new symptoms          Signed, Leala Prince, PA-C

## 2023-08-02 ENCOUNTER — Other Ambulatory Visit: Payer: Self-pay | Admitting: Internal Medicine

## 2023-08-02 ENCOUNTER — Other Ambulatory Visit: Payer: Self-pay | Admitting: Endocrinology

## 2023-08-02 DIAGNOSIS — E1165 Type 2 diabetes mellitus with hyperglycemia: Secondary | ICD-10-CM

## 2023-08-05 ENCOUNTER — Ambulatory Visit: Payer: 59 | Attending: Rheumatology | Admitting: Rheumatology

## 2023-08-05 ENCOUNTER — Encounter: Payer: Self-pay | Admitting: Rheumatology

## 2023-08-05 VITALS — BP 120/79 | HR 88 | Resp 17 | Ht 73.0 in | Wt 250.2 lb

## 2023-08-05 DIAGNOSIS — E669 Obesity, unspecified: Secondary | ICD-10-CM

## 2023-08-05 DIAGNOSIS — L4050 Arthropathic psoriasis, unspecified: Secondary | ICD-10-CM | POA: Diagnosis not present

## 2023-08-05 DIAGNOSIS — Z6833 Body mass index (BMI) 33.0-33.9, adult: Secondary | ICD-10-CM

## 2023-08-05 DIAGNOSIS — M7061 Trochanteric bursitis, right hip: Secondary | ICD-10-CM

## 2023-08-05 DIAGNOSIS — F902 Attention-deficit hyperactivity disorder, combined type: Secondary | ICD-10-CM

## 2023-08-05 DIAGNOSIS — Z79899 Other long term (current) drug therapy: Secondary | ICD-10-CM | POA: Diagnosis not present

## 2023-08-05 DIAGNOSIS — I2510 Atherosclerotic heart disease of native coronary artery without angina pectoris: Secondary | ICD-10-CM

## 2023-08-05 DIAGNOSIS — M7062 Trochanteric bursitis, left hip: Secondary | ICD-10-CM

## 2023-08-05 DIAGNOSIS — M79642 Pain in left hand: Secondary | ICD-10-CM

## 2023-08-05 DIAGNOSIS — E1169 Type 2 diabetes mellitus with other specified complication: Secondary | ICD-10-CM

## 2023-08-05 DIAGNOSIS — I152 Hypertension secondary to endocrine disorders: Secondary | ICD-10-CM

## 2023-08-05 DIAGNOSIS — M542 Cervicalgia: Secondary | ICD-10-CM

## 2023-08-05 DIAGNOSIS — E785 Hyperlipidemia, unspecified: Secondary | ICD-10-CM

## 2023-08-05 DIAGNOSIS — K2100 Gastro-esophageal reflux disease with esophagitis, without bleeding: Secondary | ICD-10-CM

## 2023-08-05 DIAGNOSIS — G8929 Other chronic pain: Secondary | ICD-10-CM

## 2023-08-05 DIAGNOSIS — M79641 Pain in right hand: Secondary | ICD-10-CM

## 2023-08-05 DIAGNOSIS — M461 Sacroiliitis, not elsewhere classified: Secondary | ICD-10-CM | POA: Diagnosis not present

## 2023-08-05 DIAGNOSIS — L409 Psoriasis, unspecified: Secondary | ICD-10-CM | POA: Diagnosis not present

## 2023-08-05 DIAGNOSIS — Z111 Encounter for screening for respiratory tuberculosis: Secondary | ICD-10-CM

## 2023-08-05 DIAGNOSIS — R7989 Other specified abnormal findings of blood chemistry: Secondary | ICD-10-CM

## 2023-08-05 DIAGNOSIS — M545 Low back pain, unspecified: Secondary | ICD-10-CM

## 2023-08-05 DIAGNOSIS — E1159 Type 2 diabetes mellitus with other circulatory complications: Secondary | ICD-10-CM

## 2023-08-05 DIAGNOSIS — M25511 Pain in right shoulder: Secondary | ICD-10-CM

## 2023-08-05 DIAGNOSIS — M2550 Pain in unspecified joint: Secondary | ICD-10-CM

## 2023-08-05 NOTE — Patient Instructions (Addendum)
 Risankizumab Injection What is this medication? RISANKIZUMAB (RIS an KIZ ue mab) treats autoimmune conditions, such as psoriasis, arthritis, Crohn disease, and ulcerative colitis. It works by slowing down an overactive immune system.  It is a monoclonal antibody. This medicine may be used for other purposes; ask your health care provider or pharmacist if you have questions. COMMON BRAND NAME(S): Skyrizi What should I tell my care team before I take this medication? They need to know if you have any of these conditions: Hepatic disease Immune system problems Infection, such as tuberculosis (TB), bacterial, fungal or viral infections Recent or upcoming vaccine An unusual or allergic reaction to risankizumab, other medications, foods, dyes, or preservatives Pregnant or trying to get pregnant Breast-feeding How should I use this medication? This medication is injected into a vein or under the skin. It is given by your care team in a hospital or clinic setting. It may also be given at home. If you get this medication at home, you will be taught how to prepare and give it. Use exactly as directed. Take it as directed on the prescription label. Keep taking it unless your care team tells you to stop. If you use a pen, be sure to take off the outer needle cover before using the dose. It is important that you put your used needles and syringes in a special sharps container. Do not put them in a trash can. If you do not have a sharps container, call your pharmacist or care team to get one. A special MedGuide will be given to you by the pharmacist with each prescription and refill. Be sure to read this information carefully each time. This medication comes with INSTRUCTIONS FOR USE. Ask your pharmacist for directions on how to use this medication. Read the information carefully. Talk to your pharmacist or care team if you have questions. Talk to your care team about the use of this medication in children.  Special care may be needed. Overdosage: If you think you have taken too much of this medicine contact a poison control center or emergency room at once. NOTE: This medicine is only for you. Do not share this medicine with others. What if I miss a dose? It is important not to miss any doses. Talk to your care team about what to do if you miss a dose. What may interact with this medication? Do not take this medication with any of the following: Live vaccines This list may not describe all possible interactions. Give your health care provider a list of all the medicines, herbs, non-prescription drugs, or dietary supplements you use. Also tell them if you smoke, drink alcohol, or use illegal drugs. Some items may interact with your medicine. What should I watch for while using this medication? Visit your care team for regular checks on your progress. Tell your care team if your symptoms do not start to get better or if they get worse. You will be tested for tuberculosis (TB) before you start this medication. If your care team prescribes any medication for TB, you should start taking the TB medication before starting this medication. Make sure to finish the full course of TB medication. This medication may increase your risk of getting an infection. Call your care team for advice if you get a fever, chills, sore throat, or other symptoms of a cold or flu. Do not treat yourself. Try to avoid being around people who are sick. This medication can decrease the response to a vaccine. If you  need to get vaccinated, tell your care team if you have received this medication. Extra booster doses may be needed. Talk to your care team to see if a different vaccination schedule is needed. What side effects may I notice from receiving this medication? Side effects that you should report to your care team as soon as possible: Allergic reactions--skin rash, itching, hives, swelling of the face, lips, tongue, or  throat Infection--fever, chills, cough, sore throat, wounds that don't heal, pain or trouble when passing urine, general feeling of discomfort or being unwell Liver injury--right upper belly pain, loss of appetite, nausea, light-colored stool, dark yellow or brown urine, yellowing skin or eyes, unusual weakness or fatigue Side effects that usually do not require medical attention (report to your care team if they continue or are bothersome): Fatigue Headache Pain, redness, or irritation at injection site Runny or stuffy nose Sore throat This list may not describe all possible side effects. Call your doctor for medical advice about side effects. You may report side effects to FDA at 1-800-FDA-1088. Where should I keep my medication? Keep out of the reach of children and pets. Store in a refrigerator. Do not freeze. Protect from light. Keep it in the original carton until you are ready to take it. See product for storage information. Each product may have different instructions. Remove the dose from the carton about 30 to 45 minutes before it is time for you to take it. Get rid of any unused medication after the expiration date. To get rid of medications that are no longer needed or have expired: Take the medication to a medication take-back program. Check with your pharmacy or law enforcement to find a location. If you cannot return the medication, ask your pharmacist or care team how to get rid of this medication safely. NOTE: This sheet is a summary. It may not cover all possible information. If you have questions about this medicine, talk to your doctor, pharmacist, or health care provider.  2024 Elsevier/Gold Standard (2023-02-12 00:00:00)  Standing Labs We placed an order today for your standing lab work.   Please have your standing labs drawn in 1 month after starting Skyrizi and then every 3 months  Please have your labs drawn 2 weeks prior to your appointment so that the provider can  discuss your lab results at your appointment, if possible.  Please note that you may see your imaging and lab results in MyChart before we have reviewed them. We will contact you once all results are reviewed. Please allow our office up to 72 hours to thoroughly review all of the results before contacting the office for clarification of your results.  WALK-IN LAB HOURS  Monday through Thursday from 8:00 am -12:30 pm and 1:00 pm-4:00 pm and Friday from 8:00 am-12:00 pm.  Patients with office visits requiring labs will be seen before walk-in labs.  You may encounter longer than normal wait times. Please allow additional time. Wait times may be shorter on  Monday and Thursday afternoons.  We do not book appointments for walk-in labs. We appreciate your patience and understanding with our staff.   Labs are drawn by [facility]. Please bring your co-pay at the time of your lab draw.  You may receive a bill from [facility] for your lab work.  Please note if you are on Hydroxychloroquine and and an order has been placed for a Hydroxychloroquine level,  you will need to have it drawn 4 hours or more after your last dose.  If you wish to have your labs drawn at another location, please call the office 24 hours in advance so we can fax the orders.  The office is located at [address], [place], [place] [address]   If you have any questions regarding directions or hours of operation,  please call [phone].   As a reminder, please drink plenty of water prior to coming for your lab work. Thanks!   Vaccines You are taking a medication(s) that can suppress your immune system.  The following immunizations are recommended: Flu annually Covid-19  RSV Td/Tdap (tetanus, diphtheria, pertussis) every 10 years Pneumonia (Prevnar 15 then Pneumovax 23 at least 1 year apart.  Alternatively, can take Prevnar 20 without needing additional dose) Shingrix: 2 doses from 4 weeks to 6 months apart  Please  check with your PCP to make sure you are up to date.   If you have signs or symptoms of an infection or start antibiotics: First, call your PCP for workup of your infection. Hold your medication through the infection, until you complete your antibiotics, and until symptoms resolve if you take the following: Injectable medication (Actemra, Benlysta, Cimzia, Cosentyx, Enbrel, Humira, Kevzara, Orencia, Remicade, Simponi, Stelara, Taltz, Tremfya) Methotrexate Leflunomide (Arava) Mycophenolate (Cellcept) Cloria Danger, Olumiant, or Rinvoq

## 2023-08-06 ENCOUNTER — Ambulatory Visit: Payer: Self-pay | Admitting: Rheumatology

## 2023-08-06 ENCOUNTER — Telehealth: Payer: Self-pay

## 2023-08-06 NOTE — Telephone Encounter (Addendum)
 Submitted a Prior Authorization request to [facility] for SKYRIZI SQ via CoverMyMeds. Will update once we receive a response.  Key: [id number]   ----- Message from CMA Marissa G sent at 08/05/2023  2:31 PM EDT ----- Please apply for [facility], per Dr. Alvira Josephs. Thanks!   Consent obtained and sent to the scan center.

## 2023-08-06 NOTE — Progress Notes (Signed)
 CBC and CMP are normal.

## 2023-08-09 LAB — CBC WITH DIFFERENTIAL/PLATELET
Absolute Lymphocytes: 1938 {cells}/uL (ref 850–3900)
Absolute Monocytes: 596 {cells}/uL (ref 200–950)
Basophils Absolute: 50 {cells}/uL (ref 0–200)
Basophils Relative: 0.7 %
Eosinophils Absolute: 341 {cells}/uL (ref 15–500)
Eosinophils Relative: 4.8 %
HCT: 48.1 % (ref 38.5–50.0)
Hemoglobin: 15.6 g/dL (ref 13.2–17.1)
MCH: 26.9 pg — ABNORMAL LOW (ref 27.0–33.0)
MCHC: 32.4 g/dL (ref 32.0–36.0)
MCV: 82.9 fL (ref 80.0–100.0)
MPV: 9.9 fL (ref 7.5–12.5)
Monocytes Relative: 8.4 %
Neutro Abs: 4175 {cells}/uL (ref 1500–7800)
Neutrophils Relative %: 58.8 %
Platelets: 233 10*3/uL (ref 140–400)
RBC: 5.8 10*6/uL (ref 4.20–5.80)
RDW: 12.7 % (ref 11.0–15.0)
Total Lymphocyte: 27.3 %
WBC: 7.1 10*3/uL (ref 3.8–10.8)

## 2023-08-09 LAB — COMPREHENSIVE METABOLIC PANEL WITH GFR
AG Ratio: 1.6 (calc) (ref 1.0–2.5)
ALT: 7 U/L — ABNORMAL LOW (ref 9–46)
AST: 11 U/L (ref 10–40)
Albumin: 4.1 g/dL (ref 3.6–5.1)
Alkaline phosphatase (APISO): 73 U/L (ref 36–130)
BUN: 12 mg/dL (ref 7–25)
CO2: 30 mmol/L (ref 20–32)
Calcium: 9.1 mg/dL (ref 8.6–10.3)
Chloride: 103 mmol/L (ref 98–110)
Creat: 1.07 mg/dL (ref 0.60–1.29)
Globulin: 2.5 g/dL (ref 1.9–3.7)
Glucose, Bld: 115 mg/dL — ABNORMAL HIGH (ref 65–99)
Potassium: 4.8 mmol/L (ref 3.5–5.3)
Sodium: 139 mmol/L (ref 135–146)
Total Bilirubin: 0.6 mg/dL (ref 0.2–1.2)
Total Protein: 6.6 g/dL (ref 6.1–8.1)
eGFR: 86 mL/min/{1.73_m2} (ref >=60)

## 2023-08-09 LAB — QUANTIFERON-TB GOLD PLUS
Mitogen-NIL: 6.77 [IU]/mL
NIL: 0.01 [IU]/mL
QuantiFERON-TB Gold Plus: NEGATIVE
TB1-NIL: 0.01 [IU]/mL
TB2-NIL: 0.01 [IU]/mL

## 2023-08-10 NOTE — Progress Notes (Signed)
TB Gold negative

## 2023-08-16 ENCOUNTER — Other Ambulatory Visit (HOSPITAL_COMMUNITY): Payer: Self-pay

## 2023-08-16 NOTE — Progress Notes (Signed)
 Pharmacy Note  Subjective:   Patient presents to clinic today to receive first dose of subcut Skyrizi for psoriatic arthritis + psoriasis overlap. Last dose of Stelara was > 1 month ago.  Patient running a fever or have signs/symptoms of infection? No  Patient currently on antibiotics for the treatment of infection? No  Patient have any upcoming invasive procedures/surgeries? No  Objective: CMP     Component Value Date/Time   NA 139 08/05/2023 1446   NA 141 02/21/2021 0752   K 4.8 08/05/2023 1446   CL 103 08/05/2023 1446   CO2 30 08/05/2023 1446   GLUCOSE 115 (H) 08/05/2023 1446   BUN 12 08/05/2023 1446   BUN 9 02/21/2021 0752   CREATININE 1.07 08/05/2023 1446   CALCIUM  9.1 08/05/2023 1446   PROT 6.6 08/05/2023 1446   ALBUMIN 4.0 12/29/2022 0731   AST 11 08/05/2023 1446   ALT 7 (L) 08/05/2023 1446   ALKPHOS 83 12/29/2022 0731   BILITOT 0.6 08/05/2023 1446   GFRNONAA >60 09/09/2019 0441   GFRAA >60 09/09/2019 0441    CBC    Component Value Date/Time   WBC 7.1 08/05/2023 1446   RBC 5.80 08/05/2023 1446   HGB 15.6 08/05/2023 1446   HGB 15.1 09/21/2019 0851   HCT 48.1 08/05/2023 1446   HCT 46.2 09/21/2019 0851   PLT 233 08/05/2023 1446   PLT 274 09/21/2019 0851   MCV 82.9 08/05/2023 1446   MCV 84 09/21/2019 0851   MCH 26.9 (L) 08/05/2023 1446   MCHC 32.4 08/05/2023 1446   RDW 12.7 08/05/2023 1446   RDW 12.9 09/21/2019 0851   LYMPHSABS 1.8 12/29/2022 0731   MONOABS 0.6 12/29/2022 0731   EOSABS 341 08/05/2023 1446   BASOSABS 50 08/05/2023 1446    Baseline Immunosuppressant Therapy Labs TB GOLD    Latest Ref Rng & Units 08/05/2023    2:46 PM  Quantiferon TB Gold  Quantiferon TB Gold Plus NEGATIVE NEGATIVE    Hepatitis Panel    Latest Ref Rng & Units 07/08/2023    9:15 AM  Hepatitis  Hep B Surface Ag NON-REACTIVE NON-REACTIVE   Hep B IgM NON-REACTIVE NON-REACTIVE   Hep C Ab NON-REACTIVE NON-REACTIVE    HIV Lab Results  Component Value Date   HIV Non  Reactive 09/07/2019   HIV NON-REACTIVE 06/23/2017   Immunoglobulins    Latest Ref Rng & Units 07/08/2023    9:15 AM  Immunoglobulin Electrophoresis  IgA  47 - 310 mg/dL 010   IgG 272 - 5,366 mg/dL 4,403   IgM 50 - 474 mg/dL 259    SPEP    Latest Ref Rng & Units 08/05/2023    2:46 PM  Serum Protein Electrophoresis  Total Protein 6.1 - 8.1 g/dL 6.6    Chest x-ray: 5/63/8756 - Normal exam  Assessment/Plan:  Reviewed importance of holding Skyrizi SQ with signs/symptoms of an infections, if antibiotics are prescribed to treat an active infection, and with invasive procedures  Demonstrated proper injection technique with Skyrizi SQ demo device  Patient able to demonstrate proper injection technique using the teach back method.  Patient self injected in the right lower abdomen with:  Sample Medication: Skyrizi 150mg /mL pen injector NDC: [id number] Lot: [id number] Expiration: 06/2024  Patient tolerated well.  Observed for 30 mins in office for adverse reaction. Patient denies itchiness and irritation at injection., No swelling or redness noted., and Reviewed injection site reaction management with patient verbally and printed information for review in AVS  Patient  is to return in 1 month for labs and 6-8 weeks for follow-up appointment.  Standing orders for CBC/CMP placed.  TB gold will be monitored yearly.   Skyrizi SQ approved through [employer] .   Rx sent to: [facility]: [phone] .  Patient provided with pharmacy phone number and advised to call later this week to schedule shipment to home.  Patient will continue Skyrizi subcut 150mg  at Week 4 then every 12 weeks thereafter as monotherapy  All questions encouraged and answered.  Instructed patient to call with any further questions or concerns.  Geraldene Kleine, PharmD, MPH, BCPS, CPP Clinical Pharmacist (Rheumatology and Pulmonology)  08/16/2023 10:38 AM

## 2023-08-16 NOTE — Telephone Encounter (Signed)
 Received notification from [facility] regarding a prior authorization for SKYRIZI SQ. Authorization has been APPROVED from 08/06/23 to 08/05/24. Approval letter sent to scan center.  Unable to run test claim because patient must fill through [facility]: [phone]   Authorization # [id number]  Enrolled patient into [facility] copay card: ID: [id number] Issued:08/16/2023 Rx GROUP: [id number] Rx BIN: [id number] Rx PCN: [id number]  Scheduled Skyrizi new start appointment for 08/17/2023. Will use sample  Rola Lennon, PharmD, MPH, BCPS, CPP Clinical Pharmacist (Rheumatology and Pulmonology)

## 2023-08-16 NOTE — Patient Instructions (Addendum)
 Your next SKYRIZI SQ dose is due on 09/14/23 then every 12 weeks thereafter (starting on 12/07/23)  HOLD SKYRIZI SQ if you have signs or symptoms of an infection. You can resume once you feel better or back to your baseline. HOLD SKYRIZI SQ if you start antibiotics to treat an infection. HOLD SKYRIZI SQ around the time of surgery/procedures. Your surgeon will be able to provide recommendations on when to hold BEFORE and when you are cleared to RESUME.  Pharmacy information: Your prescription will be shipped from [facility]. Their phone number is [phone] Please call to schedule shipment and confirm address. They will mail your medication to your home.  Cost information: Your copay should be affordable. If you call the pharmacy and it is not affordable, please double-check that they are billing through your copay card as secondary coverage. That copay card information is: ID: [id number] GROUP: [id number] BIN: [id number] PCN: [id number] Issued:08/16/2023  Labs are due in 1 month then every 3 months. Lab hours are from Monday to Thursday 8am-12:30pm and 1pm-4pm and Friday 8am-12pm. You do not need an appointment if you come for labs during these times. If you'd like to go to a [facility] or [facility] closer to home, please call our clinic 48 hours prior to lab date so we can release orders in a timely manner.  Stay up to date on all routine vaccines: influenza, pneumonia, COVID19, Shingles  How to manage an injection site reaction: Remember the 5 C's: COUNTER - leave on the counter at least 30 minutes but up to overnight to bring medication to room temperature. This may help prevent stinging COLD - place something cold (like an ice gel pack or cold water bottle) on the injection site just before cleansing with alcohol. This may help reduce pain CLARITIN - use Claritin (generic name is loratadine) for the first two weeks of treatment or the day of, the day before, and the day after injecting.  This will help to minimize injection site reactions CORTISONE CREAM - apply if injection site is irritated and itching CALL ME - if injection site reaction is bigger than the size of your fist, looks infected, blisters, or if you develop hives

## 2023-08-17 ENCOUNTER — Ambulatory Visit: Attending: Rheumatology | Admitting: Pharmacist

## 2023-08-17 DIAGNOSIS — L4050 Arthropathic psoriasis, unspecified: Secondary | ICD-10-CM

## 2023-08-17 DIAGNOSIS — Z79899 Other long term (current) drug therapy: Secondary | ICD-10-CM | POA: Diagnosis not present

## 2023-08-17 DIAGNOSIS — Z7189 Other specified counseling: Secondary | ICD-10-CM

## 2023-08-17 DIAGNOSIS — L409 Psoriasis, unspecified: Secondary | ICD-10-CM | POA: Diagnosis not present

## 2023-08-17 MED ORDER — SKYRIZI PEN 150 MG/ML ~~LOC~~ SOAJ: SUBCUTANEOUS | 0 refills | Status: DC

## 2023-09-09 ENCOUNTER — Ambulatory Visit (HOSPITAL_COMMUNITY)
Admission: RE | Admit: 2023-09-09 | Discharge: 2023-09-09 | Disposition: A | Discharge status: Home / Self Care | Source: Ambulatory Visit | Attending: Cardiovascular Disease | Admitting: Cardiovascular Disease

## 2023-09-09 ENCOUNTER — Ambulatory Visit: Payer: Self-pay | Admitting: Physician Assistant

## 2023-09-09 DIAGNOSIS — I2510 Atherosclerotic heart disease of native coronary artery without angina pectoris: Secondary | ICD-10-CM | POA: Diagnosis present

## 2023-09-09 DIAGNOSIS — I77810 Thoracic aortic ectasia: Secondary | ICD-10-CM | POA: Diagnosis present

## 2023-09-09 LAB — ECHOCARDIOGRAM COMPLETE
Area-P 1/2: 4.17 cm2
S' Lateral: 3.2 cm

## 2023-09-13 ENCOUNTER — Telehealth: Payer: Self-pay | Admitting: Internal Medicine

## 2023-09-13 DIAGNOSIS — R9431 Abnormal electrocardiogram [ECG] [EKG]: Secondary | ICD-10-CM

## 2023-09-13 DIAGNOSIS — I2510 Atherosclerotic heart disease of native coronary artery without angina pectoris: Secondary | ICD-10-CM

## 2023-09-13 NOTE — Telephone Encounter (Signed)
 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 Echo done to follow-up aortic dimension which is stable. There are no dramatic changes although he does have recurrent changes in the way the walls of his heart move. No ischemic symptoms at 07/2023 OV but had new EKG changes. I sent to Dr. Santo for review who recommends that if he is having issues with chest pain, would need to proceed with repeat cardiac cath for formal assessment. If he remains asymptomatic, would consider a cardiac PET scan given the EKG changes and echo findings. Please get update on symptoms. If any interim chest pain, please arrange ASAP OV to discuss proceeding with cath given time since last OV (with ER precautions). If no chest pain, I will leave this in Evan's inbox to review and decide on consideration of cardiac PET as outlined by Dr. Santo. Thank you!   Left message for patient to call back.

## 2023-09-13 NOTE — Telephone Encounter (Signed)
 Patient is returning call for results. Please advise

## 2023-09-15 NOTE — Telephone Encounter (Signed)
 Pt was returning call and stated he's on a cruise right now so he may not get the callback. He called while being at the port but he'd like a detailed message left and he'll return the call at the next port or once he's back from the cruise. Please advise

## 2023-09-15 NOTE — Telephone Encounter (Signed)
 Left message for patient with request to callback to discuss if having chest pain.  MyChart message also sent.

## 2023-09-20 NOTE — Telephone Encounter (Signed)
    Please report to Radiology at the [facility] Main Entrance 30 minutes early for your test.  [address] [place], [place] [address]                    How to Prepare for Your Cardiac PET/CT Stress Test:  Nothing to eat or drink, except water, 3 hours prior to arrival time.  NO caffeine/decaffeinated products, or chocolate 12 hours prior to arrival. (Please note decaffeinated beverages (teas/coffees) still contain caffeine).  If you have caffeine within 12 hours prior, the test will need to be rescheduled.  Medication instructions: Do not take erectile dysfunction medications for 72 hours prior to test (sildenafil, tadalafil) Do not take nitrates (isosorbide mononitrate, Ranexa) the day before or day of test   Diabetic Preparation: If able to eat breakfast prior to 3 hour fasting, you may take all medications, including your insulin . Do not worry if you miss your breakfast dose of insulin  - start at your next meal. If you do not eat prior to 3 hour fast-Hold all diabetes (oral and insulin ) medications.  HOLD METFORMIN  AND JARDIANCE  IF YOU DO NOT EAT PRIOR TO 3 HOUR FAST Patients who wear a continuous glucose monitor MUST remove the device prior to scanning.  You may take your remaining medications with water.  NO perfume, cologne or lotion on chest or abdomen area.   Total time is 1 to 2 hours; you may want to bring reading material for the waiting time.  In preparation for your appointment, medication and supplies will be purchased.  Appointment availability is limited, so if you need to cancel or reschedule, please call the Radiology Department Scheduler at [phone] 24 hours in advance to avoid a cancellation fee of $100.00  What to Expect When you Arrive:  Once you arrive and check in for your appointment, you will be taken to a preparation room within the Radiology Department.  A technologist or Nurse will obtain your medical history, verify that you are  correctly prepped for the exam, and explain the procedure.  Afterwards, an IV will be started in your arm and electrodes will be placed on your skin for EKG monitoring during the stress portion of the exam. Then you will be escorted to the PET/CT scanner.  There, staff will get you positioned on the scanner and obtain a blood pressure and EKG.  During the exam, you will continue to be connected to the EKG and blood pressure machines.  A small, safe amount of a radioactive tracer will be injected in your IV to obtain a series of pictures of your heart along with an injection of a stress agent.    After your Exam:  It is recommended that you eat a meal and drink a caffeinated beverage to counter act any effects of the stress agent.  Drink plenty of fluids for the remainder of the day and urinate frequently for the first couple of hours after the exam.  Your doctor will inform you of your test results within 7-10 business days.  For more information and frequently asked questions, please visit our website: [email or web address]  For questions about your test or how to prepare for your test, please call: Cardiac Imaging Nurse Navigators Office: [phone]

## 2023-09-20 NOTE — Telephone Encounter (Signed)
 Trudy [place], PA-C to Me (Selected Message)     09/17/23  6:33 AM Let's set up a stress PET for abnormal EKG.    B[place] Trudy, PA-C    Returned a call back to the pt and his wife Todd Gaines answered and said he would be available to speak around 12ish or later.   Todd Gaines (on DPR) states he is aware that [place] Trudy PA-C wanted to order for him to get a Cardiac PET Scan done for abnormal EKG. Informed Todd Gaines that is exactly what I was calling him about.  Informed the pts wife that I will reach out to him after lunch today to go over PET Scan plan and instructions.   Todd Gaines verbalized understanding and agrees with this plan. Todd Gaines states she will tell him to expect a call back from us  today, after lunch.      Will go ahead and place Cardiac PET Scan orders in the system.  Will go ahead and pend the attestation order in this encounter for [place] Trudy PA-C to sign off on.   Will also go ahead and write the pts Cardiac PET Scan instructions and send those to his active mychart account on file.

## 2023-09-20 NOTE — Telephone Encounter (Signed)
 Pts Cardiac PET Scan is scheduled for 11/23/23 at 0640.  Pt made aware of appt date and time by PET CT Scheduler.   Pt states he has not had any reoccurrence of chest pain, and feels good from a cardiac perspective.   Advised the pt if his chest pain reoccurs, he should notify our office of this.   Pt verbalized understanding and agrees with this plan.    Will send this message back to [employer], to have him sign off on pended attestation order in this encounter.

## 2023-09-20 NOTE — Addendum Note (Signed)
 Addended by: TRUDY BIRMINGHAM on: 09/20/2023 07:17 PM   Modules accepted: Orders

## 2023-09-20 NOTE — Addendum Note (Signed)
 Addended by: GLADIS PORTER HERO on: 09/20/2023 09:03 AM   Modules accepted: Orders

## 2023-10-06 NOTE — Addendum Note (Signed)
 Addended by: DAYNE SHERRY RAMAN on: 10/06/2023 10:06 AM   Modules accepted: Level of Service

## 2023-10-18 ENCOUNTER — Ambulatory Visit: Admitting: Endocrinology

## 2023-10-19 ENCOUNTER — Other Ambulatory Visit: Payer: Self-pay | Admitting: Rheumatology

## 2023-10-19 DIAGNOSIS — L4050 Arthropathic psoriasis, unspecified: Secondary | ICD-10-CM

## 2023-10-19 DIAGNOSIS — L409 Psoriasis, unspecified: Secondary | ICD-10-CM

## 2023-10-19 NOTE — Telephone Encounter (Signed)
 Last Fill: 08/17/2023  Labs: 08/05/2023 CBC and CMP are normal.    TB Gold: 08/05/2023 Negative    Next Visit: 11/04/2023  Last Visit: 08/05/2023  IK:Ednmpjupr arthritis  Current Dose per office note 08/05/2023: Skyrizi  150 mg subcuteanously every 12 weeks   Okay to refill Skyrizi ?

## 2023-10-20 ENCOUNTER — Ambulatory Visit: Payer: Self-pay | Admitting: Endocrinology

## 2023-10-20 ENCOUNTER — Encounter: Payer: Self-pay | Admitting: Endocrinology

## 2023-10-20 ENCOUNTER — Ambulatory Visit (INDEPENDENT_AMBULATORY_CARE_PROVIDER_SITE_OTHER): Admitting: Endocrinology

## 2023-10-20 VITALS — BP 118/70 | HR 85 | Resp 20 | Ht 73.0 in | Wt 242.2 lb

## 2023-10-20 DIAGNOSIS — E11319 Type 2 diabetes mellitus with unspecified diabetic retinopathy without macular edema: Secondary | ICD-10-CM | POA: Diagnosis not present

## 2023-10-20 DIAGNOSIS — Z7985 Long-term (current) use of injectable non-insulin antidiabetic drugs: Secondary | ICD-10-CM | POA: Diagnosis not present

## 2023-10-20 DIAGNOSIS — Z7984 Long term (current) use of oral hypoglycemic drugs: Secondary | ICD-10-CM

## 2023-10-20 DIAGNOSIS — E785 Hyperlipidemia, unspecified: Secondary | ICD-10-CM | POA: Diagnosis not present

## 2023-10-20 DIAGNOSIS — E118 Type 2 diabetes mellitus with unspecified complications: Secondary | ICD-10-CM

## 2023-10-20 LAB — POCT GLYCOSYLATED HEMOGLOBIN (HGB A1C): Hemoglobin A1C: 5.9 % — AB (ref 4.0–5.6)

## 2023-10-20 MED ORDER — TIRZEPATIDE 10 MG/0.5ML ~~LOC~~ SOAJ: 10.0000 mg | SUBCUTANEOUS | 4 refills | Status: AC

## 2023-10-20 NOTE — Progress Notes (Signed)
 Outpatient Endocrinology Note [place] Dwaine Pringle, MD  10/20/23  Patient's Name: Todd Gaines    DOB: 10/31/1975    MRN: [id number]                                                    REASON OF VISIT: Follow up of type 2 diabetes mellitus  PCP: Tower, Laine LABOR, MD  HISTORY OF PRESENT ILLNESS:   Todd Gaines is a 48 y.o. old male with past medical history listed below, is here for follow up for type 2 diabetes mellitus.  He has new complaints of lower total testosterone .  Pertinent Diabetes History: Patient was diagnosed with type 2 diabetes mellitus in 2012.  He was started on insulin  therapy in 2016.  Chronic Diabetes Complications : Retinopathy: yes, mild non proliferative. Last ophthalmology exam was done on 11/2022, following with ophthalmology regularly. Reviewed note.  Nephropathy: no Peripheral neuropathy: on Coronary artery disease: yes Stroke: no  Relevant comorbidities and cardiovascular risk factors: Obesity: yes Body mass index is 31.95 kg/m.  Hypertension: Yes  Hyperlipidemia : Yes, on statin   Current / Home Diabetic regimen includes: Metformin  extended release 2000 mg daily. Mounjaro  7.5 mg weekly.   Jardiance  25 mg daily.  Prior diabetic medications: V-Go pump stopped due to cost issue.  Basal bolus regimen.  Used to be on OmniPod insulin  pump in the past.  Trulicity  in the past, was switched to Mounjaro  in April 2024.  Transient nausea from Trulicity .  Glycemic data:   He has not been checking blood sugar at home.  Hypoglycemia: Patient has denies hypoglycemic episodes. Patient has hypoglycemia awareness.  Factors modifying glucose control: 1.  Diabetic diet assessment: 3 meals a day, occasionally eating [facility].  2.  Staying active or exercising: No formal exercise.  3.  Medication compliance: compliant most of the time.  # Low testosterone  :  He had low testosterone  of 232 with low normal limit of 300 in October as follows, but checked around 2  PM in the afternoon.  This was checked due to fatigue.  Patient has mild decline in libido and also has mild erectile dysfunction.  He has daughter age of 73 in 2024.  No history of opioid intake.  No history of taking androgen /steroid for muscle building.  No history of head trauma or testicular trauma.  Patient was referred to urology for evaluation, he reports he is waiting to schedule visit.   Latest Reference Range & Units 01/12/23 13:58  Testosterone  300.00 - 890.00 ng/dL 767.68 (L)  (L): Data is abnormally low  Repeat testosterone  level in the morning including total, free testosterone  and creatinine and prolactin were within normal limits as follows.    Latest Reference Range & Units 02/04/23 07:58  Sex Horm Binding Glob, Serum 10 - 50 nmol/L 26  Testosterone  264 - 916 ng/dL 617  Testosterone  Free 6.8 - 21.5 pg/mL 12.2   Interval history  Hemoglobin A1c 5.9%.  He lost about 10 pounds of weight in the last 4 months.  He has been tolerating Mounjaro  well.  Diabetes regimen as reviewed and noted above.  He was on cruise for summer vacation.  Denies numbness and tingling of the feet.  No vision problem.  No other complaints today.  REVIEW OF SYSTEMS As per history of present illness.   PAST  MEDICAL HISTORY: Past Medical History:  Diagnosis Date   CAD (coronary artery disease)    Diabetes mellitus without complication (HCC)    Elevated cholesterol    Psoriasis     PAST SURGICAL HISTORY: Past Surgical History:  Procedure Laterality Date   CORONARY STENT INTERVENTION N/A 09/08/2019   Procedure: CORONARY STENT INTERVENTION;  Surgeon: Mady Bruckner, MD;  Location: [facility] INVASIVE CV LAB;  Service: Cardiovascular;  Laterality: N/A;   LEFT HEART CATH AND CORONARY ANGIOGRAPHY N/A 09/08/2019   Procedure: LEFT HEART CATH AND CORONARY ANGIOGRAPHY;  Surgeon: Mady Bruckner, MD;  Location: [facility] INVASIVE CV LAB;  Service: Cardiovascular;  Laterality: N/A;   SHOULDER SURGERY Right      ALLERGIES: No Known Allergies  FAMILY HISTORY:  Family History  Problem Relation Age of Onset   Diabetes Mother    Healthy Father    Healthy Brother    Diabetes Paternal Grandfather    Hypertension Paternal Grandfather    Hypertension Daughter    Thyroid  disease Daughter    Heart disease Neg Hx    Cancer Neg Hx     SOCIAL HISTORY: Social History   Socioeconomic History   Marital status: Married    Spouse name: Not on file   Number of children: Not on file   Years of education: Not on file   Highest education level: Not on file  Occupational History   Not on file  Tobacco Use   Smoking status: Former    Current packs/day: 0.00    Average packs/day: 0.3 packs/day for 10.0 years (2.5 ttl pk-yrs)    Types: Cigarettes    Start date: 04/17/1992    Quit date: 04/17/2002    Years since quitting: 21.5    Passive exposure: Never   Smokeless tobacco: Never  Vaping Use   Vaping status: Never Used  Substance and Sexual Activity   Alcohol use: No    Alcohol/week: 0.0 standard drinks of alcohol   Drug use: No   Sexual activity: Yes  Other Topics Concern   Not on file  Social History Narrative   Not on file   Social Drivers of Health   Financial Resource Strain: Not on file  Food Insecurity: Not on file  Transportation Needs: Not on file  Physical Activity: Not on file  Stress: Not on file  Social Connections: Not on file    MEDICATIONS:  Current Outpatient Medications  Medication Sig Dispense Refill   amphetamine -dextroamphetamine  (ADDERALL) 20 MG tablet Take 1 tablet (20 mg total) by mouth 2 (two) times daily. 60 tablet 0   aspirin  EC 81 MG tablet Take 81 mg by mouth daily. Swallow whole.     atorvastatin  (LIPITOR ) 80 MG tablet TAKE 1 TABLET BY MOUTH DAILY 30 tablet 8   clobetasol (TEMOVATE) 0.05 % external solution Apply topically as needed (psoriasis).     empagliflozin  (JARDIANCE ) 25 MG TABS tablet TAKE 1 TABLET BY MOUTH DAILY BEFORE BREAKFAST 90 tablet 4    famotidine  (PEPCID ) 20 MG tablet TAKE 1 TABLET BY MOUTH 2 TIMES A DAY 180 tablet 1   glucose blood test strip Use OneTouch Ultra test strips as instructed to check blood sugar 4 times daily. DX:E11.65 150 each 2   metFORMIN  (GLUCOPHAGE -XR) 500 MG 24 hr tablet TAKE FOUR TABLETS BY MOUTH DAILY 360 tablet 3   metoprolol  tartrate (LOPRESSOR ) 25 MG tablet TAKE 1 TABLET BY MOUTH 2 TIMES A DAY 180 tablet 3   nitroGLYCERIN  (NITROSTAT ) 0.4 MG SL tablet Place 1 tablet (  0.4 mg total) under the tongue every 5 (five) minutes as needed for chest pain. 20 tablet 12   risankizumab -rzaa (SKYRIZI  PEN) 150 MG/ML pen Inject 1 mL (150 mg total) into the skin every 3 (three) months. 1 mL 0   testosterone  cypionate (DEPOTESTOSTERONE CYPIONATE) 200 MG/ML injection Inject 200 mg into the muscle every 7 (seven) days.     tirzepatide  (MOUNJARO ) 10 MG/0.5ML Pen Inject 10 mg into the skin once a week. 6 mL 4   No current facility-administered medications for this visit.    PHYSICAL EXAM: Vitals:   10/20/23 1505  BP: 118/70  Pulse: 85  Resp: 20  SpO2: 98%  Weight: 242 lb 3.2 oz (109.9 kg)  Height: 6' 1 (1.854 m)    Body mass index is 31.95 kg/m.  Wt Readings from Last 3 Encounters:  10/20/23 242 lb 3.2 oz (109.9 kg)  08/05/23 250 lb 3.2 oz (113.5 kg)  07/29/23 252 lb 12.8 oz (114.7 kg)    General: Well developed, well nourished male in no apparent distress.  HEENT: AT/[place], no external lesions.  Eyes: Conjunctiva clear and no icterus. Neck: Neck supple  Lungs: Respirations not labored Neurologic: Alert, oriented, normal speech Extremities / Skin: Dry.   Psychiatric: Does not appear depressed or anxious  Diabetic Foot Exam - Simple   Simple Foot Form Diabetic Foot exam was performed with the following findings: Yes 10/20/2023  3:18 PM  Visual Inspection No deformities, no ulcerations, no other skin breakdown bilaterally: Yes Sensation Testing Intact to touch and monofilament testing bilaterally:  Yes Pulse Check Posterior Tibialis and Dorsalis pulse intact bilaterally: Yes Comments    LABS Reviewed Lab Results  Component Value Date   HGBA1C 5.9 (A) 10/20/2023   HGBA1C 6.1 (A) 05/31/2023   HGBA1C 7.0 (H) 01/25/2023   Lab Results  Component Value Date   FRUCTOSAMINE 258 08/30/2019   FRUCTOSAMINE 400 (H) 04/20/2018   FRUCTOSAMINE 322 (H) 12/16/2015   Lab Results  Component Value Date   CHOL 90 12/29/2022   HDL 28.60 (L) 12/29/2022   LDLCALC 40 12/29/2022   LDLDIRECT 90.0 06/27/2019   TRIG 104.0 12/29/2022   CHOLHDL 3 12/29/2022   No results found for: [facility]  Lab Results  Component Value Date   CREATININE 1.07 08/05/2023   Lab Results  Component Value Date   GFR 97.75 01/25/2023    ASSESSMENT / PLAN  1. Controlled type 2 diabetes mellitus with complication, without long-term current use of insulin  (HCC)      Diabetes Mellitus type 2, complicated by diabetic retinopathy. - Diabetic status / severity: Controlled.  Lab Results  Component Value Date   HGBA1C 5.9 (A) 10/20/2023    - Hemoglobin A1c goal : <6.5%  - Medications: No change. Metformin  extended release 2000 mg daily. Increased Mounjaro  from 7.5 to 10 mg weekly to have better weight loss benefit. Jardiance  25 mg daily  - Home glucose testing: Advised to check at least in the morning fasting, few times a week. - Discussed/ Gave Hypoglycemia treatment plan.  # Consult : not required at this time.   # Annual urine for microalbuminuria/ creatinine ratio, no microalbuminuria currently.  Will check today. Last  No results found for: MICRALBCREAT   # Foot check nightly.  # He has diabetic retinopathy, follow-up with ophthalmology..   - Diet: Make healthy diabetic food choices.  Advised to avoid [facility]. - Life style / activity / exercise: Discussed.  2. Blood pressure  -  BP Readings from  Last 1 Encounters:  10/20/23 118/70    - Control is in target.  - No change in  current plans.  3. Lipid status / Hyperlipidemia - Last  Lab Results  Component Value Date   LDLCALC 40 12/29/2022   - Continue atorvastatin  80 mg daily.   Diagnoses and all orders for this visit:  Controlled type 2 diabetes mellitus with complication, without long-term current use of insulin  (HCC) -     POCT glycosylated hemoglobin (Hb A1C) -     Microalbumin / creatinine urine ratio -     tirzepatide  (MOUNJARO ) 10 MG/0.5ML Pen; Inject 10 mg into the skin once a week.    DISPOSITION Follow up in clinic in 4  months suggested.   All questions answered and patient verbalized understanding of the plan.  [place] Chirsty Armistead, MD [facility] Endocrinology [facility] [place] [facility] [address] [place], [address] [place], [place] [address] Phone # [phone]  At least part of this note was generated using voice recognition software. Inadvertent word errors may have occurred, which were not recognized during the proofreading process.

## 2023-10-21 LAB — MICROALBUMIN / CREATININE URINE RATIO
Creatinine, Urine: 95 mg/dL (ref 20–320)
Microalb Creat Ratio: 4 mg/g{creat} (ref <30)
Microalb, Ur: 0.4 mg/dL

## 2023-10-21 NOTE — Progress Notes (Deleted)
 Office Visit Note  Patient: Todd Gaines             Date of Birth: 12/24/1975           MRN: [id number]             PCP: Randeen Laine LABOR, MD Referring: Tower, Laine LABOR, MD Visit Date: 11/04/2023 Occupation: @GUAROCC @  Subjective:  Medication monitoring   History of Present Illness: Todd Gaines is a 48 y.o. male with history of psoriatic arthritis and osteoarthritis.  Patient is currently on Skyrizi  150 mg sq injections every 12 weeks.  Skyrizi  was started on 08/17/23.    CBC and CMP updated on 08/05/23. Orders for CBC and CMP released today.  TB gold negative on 08/05/23  Discussed the importance of holding skyrizi  if he develops signs or symptoms of an infection and to resume once the infection has completely cleared.      Activities of Daily Living:  Patient reports morning stiffness for *** {minute/hour:19697}.   Patient {ACTIONS;DENIES/REPORTS:21021675::Denies} nocturnal pain.  Difficulty dressing/grooming: {ACTIONS;DENIES/REPORTS:21021675::Denies} Difficulty climbing stairs: {ACTIONS;DENIES/REPORTS:21021675::Denies} Difficulty getting out of chair: {ACTIONS;DENIES/REPORTS:21021675::Denies} Difficulty using hands for taps, buttons, cutlery, and/or writing: {ACTIONS;DENIES/REPORTS:21021675::Denies}  No Rheumatology ROS completed.   PMFS History:  Patient Active Problem List   Diagnosis Date Noted   URI with cough and congestion 05/04/2023   Low testosterone  01/14/2023   Dysphagia 01/05/2023   Decreased libido 01/05/2023   Colon cancer screening 12/09/2021   Type 2 diabetes mellitus with obesity (HCC) 11/18/2021   Hypertension associated with diabetes (HCC) 08/16/2020   Class 1 obesity due to excess calories with serious comorbidity and body mass index (BMI) of 34.0 to 34.9 in adult 04/17/2020   Coronary artery disease involving native coronary artery of native heart without angina pectoris 04/17/2020   Routine general medical examination at a health care  facility 01/26/2018   ADHD (attention deficit hyperactivity disorder) 04/08/2016   Hyperlipidemia associated with type 2 diabetes mellitus (HCC) 04/18/2014   Psoriasis 04/18/2014    Past Medical History:  Diagnosis Date   CAD (coronary artery disease)    Diabetes mellitus without complication (HCC)    Elevated cholesterol    Psoriasis     Family History  Problem Relation Age of Onset   Diabetes Mother    Healthy Father    Healthy Brother    Diabetes Paternal Grandfather    Hypertension Paternal Grandfather    Hypertension Daughter    Thyroid  disease Daughter    Heart disease Neg Hx    Cancer Neg Hx    Past Surgical History:  Procedure Laterality Date   CORONARY STENT INTERVENTION N/A 09/08/2019   Procedure: CORONARY STENT INTERVENTION;  Surgeon: Mady Bruckner, MD;  Location: [facility] INVASIVE CV LAB;  Service: Cardiovascular;  Laterality: N/A;   LEFT HEART CATH AND CORONARY ANGIOGRAPHY N/A 09/08/2019   Procedure: LEFT HEART CATH AND CORONARY ANGIOGRAPHY;  Surgeon: Mady Bruckner, MD;  Location: [facility] INVASIVE CV LAB;  Service: Cardiovascular;  Laterality: N/A;   SHOULDER SURGERY Right    Social History   Social History Narrative   Not on file   Immunization History  Administered Date(s) Administered   Influenza,inj,Quad PF,6+ Mos 04/18/2014, 01/10/2015, 12/06/2017, 02/13/2020, 12/31/2020   Influenza-Unspecified 11/29/2015   Moderna Sars-Covid-2 Vaccination 05/26/2019, 06/26/2019   Pneumococcal Polysaccharide-23 09/28/2014   Tdap 04/08/2016     Objective: Vital Signs: There were no vitals taken for this visit.   Physical Exam Vitals and nursing note reviewed.  Constitutional:  Appearance: He is well-developed.  HENT:     Head: Normocephalic and atraumatic.  Eyes:     Conjunctiva/sclera: Conjunctivae normal.     Pupils: Pupils are equal, round, and reactive to light.  Cardiovascular:     Rate and Rhythm: Normal rate and regular rhythm.     Heart sounds:  Normal heart sounds.  Pulmonary:     Effort: Pulmonary effort is normal.     Breath sounds: Normal breath sounds.  Abdominal:     General: Bowel sounds are normal.     Palpations: Abdomen is soft.  Musculoskeletal:     Cervical back: Normal range of motion and neck supple.  Skin:    General: Skin is warm and dry.     Capillary Refill: Capillary refill takes less than 2 seconds.  Neurological:     Mental Status: He is alert and oriented to person, place, and time.  Psychiatric:        Behavior: Behavior normal.      Musculoskeletal Exam: ***  CDAI Exam: CDAI Score: -- Patient Global: --; Provider Global: -- Swollen: --; Tender: -- Joint Exam 11/04/2023   No joint exam has been documented for this visit   There is currently no information documented on the homunculus. Go to the Rheumatology activity and complete the homunculus joint exam.  Investigation: No additional findings.  Imaging: No results found.  Recent Labs: Lab Results  Component Value Date   WBC 7.1 08/05/2023   HGB 15.6 08/05/2023   PLT 233 08/05/2023   NA 139 08/05/2023   K 4.8 08/05/2023   CL 103 08/05/2023   CO2 30 08/05/2023   GLUCOSE 115 (H) 08/05/2023   BUN 12 08/05/2023   CREATININE 1.07 08/05/2023   BILITOT 0.6 08/05/2023   ALKPHOS 83 12/29/2022   AST 11 08/05/2023   ALT 7 (L) 08/05/2023   PROT 6.6 08/05/2023   ALBUMIN 4.0 12/29/2022   CALCIUM  9.1 08/05/2023   GFRAA >60 09/09/2019   QFTBGOLDPLUS NEGATIVE 08/05/2023    Speciality Comments: Skyrizi  started 08/17/23  Procedures:  No procedures performed Allergies: Patient has no known allergies.   Assessment / Plan:     Visit Diagnoses: Psoriatic arthritis (HCC)  Psoriasis  High risk medication use  Sacroiliitis (HCC)  Trochanteric bursitis of both hips  Chronic right shoulder pain  Pain in both hands  Neck pain  Chronic midline low back pain without sciatica  Hypertension associated with diabetes (HCC)  Coronary  artery disease involving native coronary artery of native heart without angina pectoris  Hyperlipidemia associated with type 2 diabetes mellitus (HCC)  Type 2 diabetes mellitus with obesity (HCC)  Gastroesophageal reflux disease with esophagitis without hemorrhage  Attention deficit hyperactivity disorder (ADHD), combined type  Low testosterone   Orders: No orders of the defined types were placed in this encounter.  No orders of the defined types were placed in this encounter.   Face-to-face time spent with patient was *** minutes. Greater than 50% of time was spent in counseling and coordination of care.  Follow-Up Instructions: No follow-ups on file.   [place] CHRISTELLA Craze, PA-C  Note - This record has been created using Dragon software.  Chart creation errors have been sought, but may not always  have been located. Such creation errors do not reflect on  the standard of medical care.

## 2023-11-04 ENCOUNTER — Ambulatory Visit: Admitting: Physician Assistant

## 2023-11-04 DIAGNOSIS — M542 Cervicalgia: Secondary | ICD-10-CM

## 2023-11-04 DIAGNOSIS — E1169 Type 2 diabetes mellitus with other specified complication: Secondary | ICD-10-CM

## 2023-11-04 DIAGNOSIS — L4050 Arthropathic psoriasis, unspecified: Secondary | ICD-10-CM

## 2023-11-04 DIAGNOSIS — M461 Sacroiliitis, not elsewhere classified: Secondary | ICD-10-CM

## 2023-11-04 DIAGNOSIS — M7061 Trochanteric bursitis, right hip: Secondary | ICD-10-CM

## 2023-11-04 DIAGNOSIS — E1159 Type 2 diabetes mellitus with other circulatory complications: Secondary | ICD-10-CM

## 2023-11-04 DIAGNOSIS — G8929 Other chronic pain: Secondary | ICD-10-CM

## 2023-11-04 DIAGNOSIS — M19041 Primary osteoarthritis, right hand: Secondary | ICD-10-CM

## 2023-11-04 DIAGNOSIS — L409 Psoriasis, unspecified: Secondary | ICD-10-CM

## 2023-11-04 DIAGNOSIS — K2100 Gastro-esophageal reflux disease with esophagitis, without bleeding: Secondary | ICD-10-CM

## 2023-11-04 DIAGNOSIS — R7989 Other specified abnormal findings of blood chemistry: Secondary | ICD-10-CM

## 2023-11-04 DIAGNOSIS — Z79899 Other long term (current) drug therapy: Secondary | ICD-10-CM

## 2023-11-04 DIAGNOSIS — E669 Obesity, unspecified: Secondary | ICD-10-CM

## 2023-11-04 DIAGNOSIS — F902 Attention-deficit hyperactivity disorder, combined type: Secondary | ICD-10-CM

## 2023-11-04 DIAGNOSIS — I2510 Atherosclerotic heart disease of native coronary artery without angina pectoris: Secondary | ICD-10-CM

## 2023-11-04 DIAGNOSIS — M545 Low back pain, unspecified: Secondary | ICD-10-CM

## 2023-11-16 NOTE — Progress Notes (Signed)
 Office Visit Note  Patient: Todd Gaines             Date of Birth: 09/09/1975           MRN: [id number]             PCP: Randeen Laine LABOR, MD Referring: Tower, Laine LABOR, MD Visit Date: 11/30/2023 Occupation: @GUAROCC @  Subjective:  Medication monitoring   History of Present Illness: Todd Gaines is a 48 y.o. male with history of psoriatic arthritis and osteoarthritis.  Patient is currently on Skyrizi  150 mg sq injections every 12 weeks.  Skyrizi  was started on 08/17/23.  He is tolerating Skyrizi  without any side effects and has not had any injection site reactions.  Patient states that since starting Skyrizi  his joint pain, stiffness, and swelling has resolved.  Patient states that his symptoms are night and day different on skyrizi .  He states that he no longer has to think about pain or stiffness after sitting for prolonged periods of time or with a change in activity.  He denies any Achilles tendinitis or plantar fasciitis.  He denies any SI joint pain.  He denies any active psoriasis. He denies any recent or recurrent infections.   Activities of Daily Living:  Patient reports morning stiffness for 0 minute.   Patient Denies nocturnal pain.  Difficulty dressing/grooming: Denies Difficulty climbing stairs: Denies Difficulty getting out of chair: Denies Difficulty using hands for taps, buttons, cutlery, and/or writing: Denies  Review of Systems  Constitutional:  Negative for fatigue.  HENT:  Negative for mouth sores and mouth dryness.   Eyes:  Negative for dryness.  Respiratory:  Negative for shortness of breath.   Cardiovascular:  Negative for chest pain and palpitations.  Gastrointestinal:  Negative for blood in stool, constipation and diarrhea.  Endocrine: Negative for increased urination.  Genitourinary:  Negative for involuntary urination.  Musculoskeletal:  Negative for joint pain, gait problem, joint pain, joint swelling, myalgias, muscle weakness, morning stiffness,  muscle tenderness and myalgias.  Skin:  Negative for color change, rash, hair loss and sensitivity to sunlight.  Allergic/Immunologic: Negative for susceptible to infections.  Neurological:  Negative for dizziness and headaches.  Hematological:  Negative for swollen glands.  Psychiatric/Behavioral:  Negative for depressed mood and sleep disturbance. The patient is not nervous/anxious.     PMFS History:  Patient Active Problem List   Diagnosis Date Noted   URI with cough and congestion 05/04/2023   Low testosterone  01/14/2023   Dysphagia 01/05/2023   Decreased libido 01/05/2023   Colon cancer screening 12/09/2021   Type 2 diabetes mellitus with obesity (HCC) 11/18/2021   Hypertension associated with diabetes (HCC) 08/16/2020   Class 1 obesity due to excess calories with serious comorbidity and body mass index (BMI) of 34.0 to 34.9 in adult 04/17/2020   Coronary artery disease involving native coronary artery of native heart without angina pectoris 04/17/2020   Routine general medical examination at a health care facility 01/26/2018   ADHD (attention deficit hyperactivity disorder) 04/08/2016   Hyperlipidemia associated with type 2 diabetes mellitus (HCC) 04/18/2014   Psoriasis 04/18/2014    Past Medical History:  Diagnosis Date   CAD (coronary artery disease)    Diabetes mellitus without complication (HCC)    Elevated cholesterol    Psoriasis     Family History  Problem Relation Age of Onset   Diabetes Mother    Healthy Father    Healthy Brother    Diabetes Paternal Grandfather  Hypertension Paternal Grandfather    Hypertension Daughter    Thyroid  disease Daughter    Heart disease Neg Hx    Cancer Neg Hx    Past Surgical History:  Procedure Laterality Date   CORONARY STENT INTERVENTION N/A 09/08/2019   Procedure: CORONARY STENT INTERVENTION;  Surgeon: Mady Bruckner, MD;  Location: [facility];  Service: Cardiovascular;  Laterality: N/A;   LEFT HEART CATH  AND CORONARY ANGIOGRAPHY N/A 09/08/2019   Procedure: LEFT HEART CATH AND CORONARY ANGIOGRAPHY;  Surgeon: Mady Bruckner, MD;  Location: [facility];  Service: Cardiovascular;  Laterality: N/A;   SHOULDER SURGERY Right    Social History   Social History Narrative   Not on file   Immunization History  Administered Date(s) Administered   Influenza,inj,Quad PF,6+ Mos 04/18/2014, 01/10/2015, 12/06/2017, 02/13/2020, 12/31/2020   Influenza-Unspecified 11/29/2015   Moderna Sars-Covid-2 Vaccination 05/26/2019, 06/26/2019   Pneumococcal Polysaccharide-23 09/28/2014   Tdap 04/08/2016     Objective: Vital Signs: BP 118/80   Pulse 86   Temp 98.1 F (36.7 C)   Resp 14   Ht 6' 1 (1.854 m)   Wt 237 lb 6.4 oz (107.7 kg)   BMI 31.32 kg/m    Physical Exam Vitals and nursing note reviewed.  Constitutional:      Appearance: He is well-developed.  HENT:     Head: Normocephalic and atraumatic.  Eyes:     Conjunctiva/sclera: Conjunctivae normal.     Pupils: Pupils are equal, round, and reactive to light.  Cardiovascular:     Rate and Rhythm: Normal rate and regular rhythm.     Heart sounds: Normal heart sounds.  Pulmonary:     Effort: Pulmonary effort is normal.     Breath sounds: Normal breath sounds.  Abdominal:     General: Bowel sounds are normal.     Palpations: Abdomen is soft.  Musculoskeletal:     Cervical back: Normal range of motion and neck supple.  Skin:    General: Skin is warm and dry.     Capillary Refill: Capillary refill takes less than 2 seconds.  Neurological:     Mental Status: He is alert and oriented to person, place, and time.  Psychiatric:        Behavior: Behavior normal.      Musculoskeletal Exam: C-spine, thoracic spine, lumbar spine have good range of motion.  No midline spinal tenderness.  No SI joint tenderness.  Shoulder joints, elbow joints, wrist joints, MCPs, PIPs, DIPs have good range of motion with no synovitis.  PIP and DIP  thickening noted.  Hip joints have good range of motion with no groin pain.  Knee joints have good range of motion no warmth or effusion.  Ankle joints have good range of motion no tenderness or joint swelling.  No evidence of Achilles tendinitis or plantar fasciitis.   CDAI Exam: CDAI Score: -- Patient Global: --; Provider Global: -- Swollen: --; Tender: -- Joint Exam 11/30/2023   No joint exam has been documented for this visit   There is currently no information documented on the homunculus. Go to the Rheumatology activity and complete the homunculus joint exam.  Investigation: No additional findings.  Imaging: NM PET CT CARDIAC PERFUSION MULTI W/ABSOLUTE BLOODFLOW Result Date: 11/23/2023   The study is normal. The study is low risk. Left ventricular systolic function is mildly abnormal, recommend correlation with echocardiography.   LV perfusion is normal. There is no evidence of ischemia. There is no evidence of infarction.  Rest left ventricular function is abnormal. Rest global function is mildly reduced. There were no regional wall motion abnormalities. Rest EF: 49%. Stress left ventricular function is abnormal. Stress EF: 49%. End diastolic cavity size is normal. End systolic cavity size is normal.   Myocardial blood flow was computed to be 0.37ml/g/min at rest and 2.39ml/g/min at stress. Global myocardial blood flow reserve was 2.49 and was normal.   Coronary calcium  assessment not performed due to prior revascularization.   Electronically signed by Jerel Balding, MD EXAM: OVER-READ INTERPRETATION  CT CHEST The following report is a limited chest CT over-read performed by radiologist Dr. Elsie Ko [facility] on 11/23/2023. This over-read does not include interpretation of cardiac or coronary anatomy or pathology nor does it include evaluation of the PET data. The cardiac PET-CT interpretation by the cardiologist is attached. COMPARISON:  Chest radiographs 09/07/2019.   Chest CTA 02/28/2021. FINDINGS: Mediastinum/Nodes: No enlarged lymph nodes within the visualized mediastinum.Right coronary artery atherosclerosis versus stents. Lungs/Pleura: There is no pleural effusion. The visualized lungs appear clear. Upper abdomen: No significant findings in the visualized upper abdomen. Musculoskeletal/Chest wall: No chest wall mass or suspicious osseous findings within the visualized chest. IMPRESSION: No significant extracardiac findings within the visualized chest. Electronically Signed   By: Elsie Perone M.D.   On: 11/23/2023 10:31   Recent Labs: Lab Results  Component Value Date   WBC 7.1 08/05/2023   HGB 15.6 08/05/2023   PLT 233 08/05/2023   NA 139 08/05/2023   K 4.8 08/05/2023   CL 103 08/05/2023   CO2 30 08/05/2023   GLUCOSE 115 (H) 08/05/2023   BUN 12 08/05/2023   CREATININE 1.07 08/05/2023   BILITOT 0.6 08/05/2023   ALKPHOS 83 12/29/2022   AST 11 08/05/2023   ALT 7 (L) 08/05/2023   PROT 6.6 08/05/2023   ALBUMIN 4.0 12/29/2022   CALCIUM  9.1 08/05/2023   GFRAA >60 09/09/2019   QFTBGOLDPLUS NEGATIVE 08/05/2023    Speciality Comments: Skyrizi  started 08/17/23  Procedures:  No procedures performed Allergies: Patient has no known allergies.   Assessment / Plan:     Visit Diagnoses: Psoriatic arthritis (HCC): No synovitis or dactylitis on examination.  No evidence of Achilles tendinitis or plantar fasciitis.  No SI joint tenderness upon palpation.  No active psoriasis at this time.  He has not been experiencing morning stiffness, nocturnal pain, or difficulty performing ADLs.  His joint pain and joint stiffness have resolved since initiating Skyrizi  on 08/17/2023.  Plan to continue Skyrizi  150 mg sq injections once every 12 weeks.  He was advised notify us  if he develops any new or worsening symptoms.  He will follow-up in the office in 5 months or sooner if needed.  Psoriasis - On Stelara and clobetasol solution.  Psoriasis clearance 100% on Stelara.   No recurrence of psoriasis since switching to Skyrizi .  High risk medication use -Skyrizi  150 mg sq injections once every 12 weeks-started on 08/17/2023 Previous treatment Humira-inadequate response, Raptiva-off market, Stelara x10 years CBC and CMP updated on 08/05/23. Orders for CBC and CMP released today.  TB gold negative on 08/05/23  No recent or recurrent infections.  Discussed the importance of holding skyrizi  if he develops signs or symptoms of an infection and to resume once the infection has completely cleared.   - Plan: CBC with Differential/Platelet, Comprehensive metabolic panel with GFR  Sacroiliitis (HCC): No SI joint tenderness upon palpation.  No morning stiffness or nocturnal pain.  His symptoms have improved since  initiating Skyrizi .  Trochanteric bursitis of both hips: Not currently symptomatic.  Resolved since initiating Skyrizi   Chronic right shoulder pain: Status post right labral repair by Dr. Cristy in the past: He has good range of motion of the right shoulder joint on examination today.  No discomfort or tenderness noted.  Pain in both hands: No synovitis or dactylitis noted.  Neck pain - Cervical spine x-rays showed anterior osteophytes and facet joint arthropathy.  C-spine has good range of motion with no discomfort.  Chronic midline low back pain without sciatica - X-rays showed mild spondylosis and facet joint arthropathy. No midline spinal tenderness.  No SI joint tenderness.    Other medical conditions are listed as follows:  Coronary artery disease involving native coronary artery of native heart without angina pectoris  Hypertension associated with diabetes [facility]): Blood pressure was 118/80 today in the office.  Hyperlipidemia associated with type 2 diabetes mellitus (HCC)  Type 2 diabetes mellitus with obesity (HCC)  Gastroesophageal reflux disease with esophagitis without hemorrhage  Attention deficit hyperactivity disorder (ADHD), combined  type  Orders: Orders Placed This Encounter  Procedures   CBC with Differential/Platelet   Comprehensive metabolic panel with GFR   No orders of the defined types were placed in this encounter.    Follow-Up Instructions: Return in about 5 months (around 05/01/2024) for Psoriatic arthritis.   Waddell CHRISTELLA Craze, PA-C  Note - This record has been created using Dragon software.  Chart creation errors have been sought, but may not always  have been located. Such creation errors do not reflect on  the standard of medical care.

## 2023-11-18 ENCOUNTER — Encounter (HOSPITAL_COMMUNITY): Payer: Self-pay

## 2023-11-23 ENCOUNTER — Encounter (HOSPITAL_COMMUNITY)
Admission: RE | Admit: 2023-11-23 | Discharge: 2023-11-23 | Disposition: A | Discharge status: Home / Self Care | Source: Ambulatory Visit | Attending: Cardiology | Admitting: Cardiology

## 2023-11-23 ENCOUNTER — Ambulatory Visit: Payer: Self-pay | Admitting: Cardiology

## 2023-11-23 DIAGNOSIS — I2510 Atherosclerotic heart disease of native coronary artery without angina pectoris: Secondary | ICD-10-CM | POA: Diagnosis present

## 2023-11-23 DIAGNOSIS — R9431 Abnormal electrocardiogram [ECG] [EKG]: Secondary | ICD-10-CM | POA: Diagnosis present

## 2023-11-23 DIAGNOSIS — R931 Abnormal findings on diagnostic imaging of heart and coronary circulation: Secondary | ICD-10-CM

## 2023-11-23 LAB — NM PET CT CARDIAC PERFUSION MULTI W/ABSOLUTE BLOODFLOW
LV dias vol: 148 mL (ref 62–150)
LV sys vol: 75 mL (ref 4.2–5.8)
MBFR: 2.49
Nuc Rest EF: 49 %
Nuc Stress EF: 49 %
Peak HR: 150 {beats}/min
Rest HR: 106 {beats}/min
Rest MBF: 0.96 ml/g/min
Rest Nuclear Isotope Dose: 27.8 mCi
ST Depression (mm): 0 mm
Stress MBF: 2.39 ml/g/min
Stress Nuclear Isotope Dose: 27.2 mCi

## 2023-11-23 MED ORDER — CAFFEINE CITRATE BASE COMPONENT 10 MG/ML IV SOLN
INTRAVENOUS | Status: AC
  Filled 2023-11-23: qty 3

## 2023-11-23 MED ORDER — RUBIDIUM RB82 GENERATOR (RUBYFILL)
30.0000 | PACK | Freq: Once | INTRAVENOUS | Status: AC
Start: 2023-11-23 — End: 2023-11-23
  Administered 2023-11-23: 27.8 via INTRAVENOUS

## 2023-11-23 MED ORDER — RUBIDIUM RB82 GENERATOR (RUBYFILL)
30.0000 | PACK | Freq: Once | INTRAVENOUS | Status: AC
Start: 2023-11-23 — End: 2023-11-23
  Administered 2023-11-23: 27.72 via INTRAVENOUS

## 2023-11-23 MED ORDER — REGADENOSON 0.4 MG/5ML IV SOLN
0.4000 mg | Freq: Once | INTRAVENOUS | Status: AC
  Administered 2023-11-23: 0.4 mg via INTRAVENOUS

## 2023-11-23 MED ORDER — REGADENOSON 0.4 MG/5ML IV SOLN
INTRAVENOUS | Status: AC
  Filled 2023-11-23: qty 5

## 2023-11-23 NOTE — Progress Notes (Signed)
 Pt. Tolerated lexi scan well.

## 2023-11-30 ENCOUNTER — Encounter: Payer: Self-pay | Admitting: Physician Assistant

## 2023-11-30 ENCOUNTER — Ambulatory Visit: Attending: Physician Assistant | Admitting: Physician Assistant

## 2023-11-30 VITALS — BP 118/80 | HR 86 | Temp 98.1°F | Resp 14 | Ht 73.0 in | Wt 237.4 lb

## 2023-11-30 DIAGNOSIS — E1159 Type 2 diabetes mellitus with other circulatory complications: Secondary | ICD-10-CM

## 2023-11-30 DIAGNOSIS — L4050 Arthropathic psoriasis, unspecified: Secondary | ICD-10-CM | POA: Diagnosis not present

## 2023-11-30 DIAGNOSIS — E785 Hyperlipidemia, unspecified: Secondary | ICD-10-CM

## 2023-11-30 DIAGNOSIS — M7062 Trochanteric bursitis, left hip: Secondary | ICD-10-CM

## 2023-11-30 DIAGNOSIS — E669 Obesity, unspecified: Secondary | ICD-10-CM

## 2023-11-30 DIAGNOSIS — E1169 Type 2 diabetes mellitus with other specified complication: Secondary | ICD-10-CM

## 2023-11-30 DIAGNOSIS — L409 Psoriasis, unspecified: Secondary | ICD-10-CM | POA: Diagnosis not present

## 2023-11-30 DIAGNOSIS — M25511 Pain in right shoulder: Secondary | ICD-10-CM

## 2023-11-30 DIAGNOSIS — M79642 Pain in left hand: Secondary | ICD-10-CM

## 2023-11-30 DIAGNOSIS — M7061 Trochanteric bursitis, right hip: Secondary | ICD-10-CM

## 2023-11-30 DIAGNOSIS — M79641 Pain in right hand: Secondary | ICD-10-CM

## 2023-11-30 DIAGNOSIS — G8929 Other chronic pain: Secondary | ICD-10-CM

## 2023-11-30 DIAGNOSIS — I2510 Atherosclerotic heart disease of native coronary artery without angina pectoris: Secondary | ICD-10-CM

## 2023-11-30 DIAGNOSIS — M461 Sacroiliitis, not elsewhere classified: Secondary | ICD-10-CM | POA: Diagnosis not present

## 2023-11-30 DIAGNOSIS — F902 Attention-deficit hyperactivity disorder, combined type: Secondary | ICD-10-CM

## 2023-11-30 DIAGNOSIS — I152 Hypertension secondary to endocrine disorders: Secondary | ICD-10-CM

## 2023-11-30 DIAGNOSIS — K2100 Gastro-esophageal reflux disease with esophagitis, without bleeding: Secondary | ICD-10-CM

## 2023-11-30 DIAGNOSIS — M545 Low back pain, unspecified: Secondary | ICD-10-CM

## 2023-11-30 DIAGNOSIS — M542 Cervicalgia: Secondary | ICD-10-CM

## 2023-11-30 DIAGNOSIS — Z79899 Other long term (current) drug therapy: Secondary | ICD-10-CM

## 2023-11-30 LAB — CBC WITH DIFFERENTIAL/PLATELET
Absolute Lymphocytes: 1730 {cells}/uL (ref 850–3900)
Absolute Monocytes: 664 {cells}/uL (ref 200–950)
Basophils Absolute: 33 {cells}/uL (ref 0–200)
Basophils Relative: 0.4 %
Eosinophils Absolute: 328 {cells}/uL (ref 15–500)
Eosinophils Relative: 4 %
HCT: 50 % (ref 38.5–50.0)
Hemoglobin: 16.2 g/dL (ref 13.2–17.1)
MCH: 26.6 pg — ABNORMAL LOW (ref 27.0–33.0)
MCHC: 32.4 g/dL (ref 32.0–36.0)
MCV: 82.1 fL (ref 80.0–100.0)
MPV: 10 fL (ref 7.5–12.5)
Monocytes Relative: 8.1 %
Neutro Abs: 5445 {cells}/uL (ref 1500–7800)
Neutrophils Relative %: 66.4 %
Platelets: 238 Thousand/uL (ref 140–400)
RBC: 6.09 Million/uL — ABNORMAL HIGH (ref 4.20–5.80)
RDW: 13.8 % (ref 11.0–15.0)
Total Lymphocyte: 21.1 %
WBC: 8.2 Thousand/uL (ref 3.8–10.8)

## 2023-11-30 LAB — COMPREHENSIVE METABOLIC PANEL WITH GFR
AG Ratio: 1.9 (calc) (ref 1.0–2.5)
ALT: 7 U/L — ABNORMAL LOW (ref 9–46)
AST: 12 U/L (ref 10–40)
Albumin: 4.2 g/dL (ref 3.6–5.1)
Alkaline phosphatase (APISO): 68 U/L (ref 36–130)
BUN: 8 mg/dL (ref 7–25)
CO2: 30 mmol/L (ref 20–32)
Calcium: 9.2 mg/dL (ref 8.6–10.3)
Chloride: 101 mmol/L (ref 98–110)
Creat: 1.08 mg/dL (ref 0.60–1.29)
Globulin: 2.2 g/dL (ref 1.9–3.7)
Glucose, Bld: 98 mg/dL (ref 65–99)
Potassium: 5 mmol/L (ref 3.5–5.3)
Sodium: 139 mmol/L (ref 135–146)
Total Bilirubin: 0.7 mg/dL (ref 0.2–1.2)
Total Protein: 6.4 g/dL (ref 6.1–8.1)
eGFR: 85 mL/min/1.73m2 (ref >=60)

## 2023-11-30 NOTE — Patient Instructions (Signed)

## 2023-12-01 ENCOUNTER — Ambulatory Visit: Payer: Self-pay | Admitting: Physician Assistant

## 2023-12-01 NOTE — Progress Notes (Signed)
 RBC count is elevated-6.09 and MCH is borderline low but stable.  Rest of CBC WNL.  We will continue to monitor.   CMP WNL

## 2023-12-03 ENCOUNTER — Other Ambulatory Visit: Payer: Self-pay | Admitting: Internal Medicine

## 2023-12-18 LAB — OPHTHALMOLOGY REPORT-SCANNED

## 2023-12-30 ENCOUNTER — Other Ambulatory Visit: Payer: Self-pay | Admitting: Family Medicine

## 2023-12-30 DIAGNOSIS — R1319 Other dysphagia: Secondary | ICD-10-CM

## 2023-12-30 NOTE — Telephone Encounter (Signed)
 Copied from CRM [id number]. Topic: Clinical - Medication Refill >> Dec 30, 2023  4:27 PM Rosina BIRCH wrote: Medication: famotidine  (PEPCID ) 20 MG tablet  Has the patient contacted their pharmacy? Yes (Agent: If no, request that the patient contact the pharmacy for the refill. If patient does not wish to contact the pharmacy document the reason why and proceed with request.) (Agent: If yes, when and what did the pharmacy advise?)  This is the patient's preferred pharmacy:  [facility] [id number] [place], [place] - [address] [place] [address] Phone: [phone] Fax: [phone]  Is this the correct pharmacy for this prescription? Yes If no, delete pharmacy and type the correct one.   Has the prescription been filled recently? No  Is the patient out of the medication? Yes  Has the patient been seen for an appointment in the last year OR does the patient have an upcoming appointment? yes  Can we respond through MyChart? Yes  Agent: Please be advised that Rx refills may take up to 3 business days. We ask that you follow-up with your pharmacy.

## 2023-12-31 MED ORDER — FAMOTIDINE 20 MG PO TABS: 20.0000 mg | ORAL_TABLET | Freq: Two times a day (BID) | ORAL | 0 refills | Status: DC

## 2023-12-31 NOTE — Telephone Encounter (Signed)
 E-scribed refill.  Pls schedule CPE and fasting labs for additional refills.

## 2024-01-08 ENCOUNTER — Other Ambulatory Visit: Payer: Self-pay | Admitting: Physician Assistant

## 2024-01-08 DIAGNOSIS — L4050 Arthropathic psoriasis, unspecified: Secondary | ICD-10-CM

## 2024-01-08 DIAGNOSIS — L409 Psoriasis, unspecified: Secondary | ICD-10-CM

## 2024-01-10 NOTE — Telephone Encounter (Signed)
 Last Fill: 10/19/2023  Labs: 11/30/2023 RBC count is elevated-6.09 and MCH is borderline low but stable. Rest of CBC WNL. CMP WNL   TB Gold: 08/05/2023   Next Visit: Due February 2026. Message sent to the front to schedule.   Last Visit: 11/30/2023  DX: Psoriatic arthritis    Current Dose per office note 11/30/2023: Skyrizi  150 mg sq injections once every 12 weeks   Okay to refill Skyrizi ?

## 2024-01-10 NOTE — Telephone Encounter (Signed)
 Please schedule patient a follow up visit. Patient due February 2026. Thanks!   Follow-Up Instructions: Return in about 5 months (around 05/01/2024) for Psoriatic arthritis.

## 2024-02-01 ENCOUNTER — Telehealth: Payer: Self-pay | Admitting: *Deleted

## 2024-02-01 NOTE — Telephone Encounter (Signed)
   Pre-operative Risk Assessment    Patient Name: Todd Gaines  DOB: 1976/02/06 MRN: [id number]   Date of last office visit: 07/29/23 Date of next office visit: N/A  Request for Surgical Clearance    Procedure:  FILLINGS  Date of Surgery:  Clearance TBD                                Surgeon:  N/A Surgeon's Group or Practice Name:  [facility] Phone number:  [phone] Fax number:  [phone]   Type of Clearance Requested:   - Medical    Type of Anesthesia:  LOCAL ANESTHETIC (WITH EPINEPHRINE)   Additional requests/questions:  PLEASE PROVIDE A COMPLETE PROBLEM LIST AND A LIST OF ALL CURRENT MEDICATIONS ALONG WITH ANY CONCERNS YOU HAVE WITH CONTINUING DENTAL TREATMENT.  Signed, Apolinar Essex   02/01/2024, 4:16 PM

## 2024-02-01 NOTE — Telephone Encounter (Signed)
    Primary Cardiologist: Stanly DELENA Leavens, MD  Chart reviewed as part of pre-operative protocol coverage. Simple dental extractions are considered low risk procedures per guidelines and generally do not require any specific cardiac clearance. It is also generally accepted that for simple extractions and dental cleanings, there is no need to interrupt blood thinner therapy.   SBE prophylaxis is not required for the patient.  I will route this recommendation to the requesting party via Epic fax function and remove from pre-op pool.  Please call with questions.  Todd CHRISTELLA Beauvais, NP 02/01/2024, 4:36 PM

## 2024-02-16 ENCOUNTER — Encounter: Payer: Self-pay | Admitting: Family Medicine

## 2024-02-16 ENCOUNTER — Ambulatory Visit: Admitting: Family Medicine

## 2024-02-16 VITALS — BP 129/70 | HR 81 | Temp 97.9°F | Ht 72.5 in | Wt 226.0 lb

## 2024-02-16 DIAGNOSIS — E785 Hyperlipidemia, unspecified: Secondary | ICD-10-CM | POA: Diagnosis not present

## 2024-02-16 DIAGNOSIS — F902 Attention-deficit hyperactivity disorder, combined type: Secondary | ICD-10-CM | POA: Diagnosis not present

## 2024-02-16 DIAGNOSIS — E1169 Type 2 diabetes mellitus with other specified complication: Secondary | ICD-10-CM

## 2024-02-16 DIAGNOSIS — Z125 Encounter for screening for malignant neoplasm of prostate: Secondary | ICD-10-CM | POA: Diagnosis not present

## 2024-02-16 DIAGNOSIS — E6609 Other obesity due to excess calories: Secondary | ICD-10-CM

## 2024-02-16 DIAGNOSIS — I152 Hypertension secondary to endocrine disorders: Secondary | ICD-10-CM | POA: Diagnosis not present

## 2024-02-16 DIAGNOSIS — E1159 Type 2 diabetes mellitus with other circulatory complications: Secondary | ICD-10-CM | POA: Diagnosis not present

## 2024-02-16 DIAGNOSIS — Z0000 Encounter for general adult medical examination without abnormal findings: Secondary | ICD-10-CM

## 2024-02-16 DIAGNOSIS — E66811 Obesity, class 1: Secondary | ICD-10-CM

## 2024-02-16 DIAGNOSIS — Z1211 Encounter for screening for malignant neoplasm of colon: Secondary | ICD-10-CM

## 2024-02-16 DIAGNOSIS — E669 Obesity, unspecified: Secondary | ICD-10-CM

## 2024-02-16 DIAGNOSIS — Z6834 Body mass index (BMI) 34.0-34.9, adult: Secondary | ICD-10-CM

## 2024-02-16 DIAGNOSIS — R7989 Other specified abnormal findings of blood chemistry: Secondary | ICD-10-CM

## 2024-02-16 NOTE — Assessment & Plan Note (Signed)
 Under endocrinology care Very well controlled Jardiance  , metformin  xr and moujaro  Continues to work on weight loss

## 2024-02-16 NOTE — Assessment & Plan Note (Signed)
 Discussed how this problem influences overall health and the risks it imposes  Reviewed plan for weight loss with lower calorie diet (via better food choices (lower glycemic and portion control) along with exercise building up to or more than 30 minutes 5 days per week including some aerobic activity and strength training   Commended on weight loss so far Continues mounjaro  for dm2

## 2024-02-16 NOTE — Progress Notes (Signed)
 Subjective:    Patient ID: Todd Gaines, male    DOB: [birth year], 48 y.o.   MRN: [id number]  HPI  Here for health maintenance exam and to review chronic medical problems   Wt Readings from Last 3 Encounters:  02/16/24 226 lb (102.5 kg)  11/30/23 237 lb 6.4 oz (107.7 kg)  10/20/23 242 lb 3.2 oz (109.9 kg)   30.23 kg/m  Vitals:   02/16/24 1600 02/16/24 1631  BP: (!) 136/92 129/70  Pulse: 81   Temp: 97.9 F (36.6 C)   SpO2: 97%     Immunization History  Administered Date(s) Administered   Influenza,inj,Quad PF,6+ Mos 04/18/2014, 01/10/2015, 12/06/2017, 02/13/2020, 12/31/2020   Influenza-Unspecified 11/29/2015   Moderna Sars-Covid-2 Vaccination 05/26/2019, 06/26/2019   Pneumococcal Polysaccharide-23 09/28/2014   Tdap 04/08/2016    Health Maintenance Due  Topic Date Due   Hepatitis B Vaccines 19-59 Average Risk (1 of 3 - 19+ 3-dose series) Never done   Pneumococcal Vaccine (2 of 2 - PCV) 09/28/2015   Colonoscopy  Never done   OPHTHALMOLOGY EXAM  12/14/2023   Feels good   Declines flu shot   Had DM eye exam 1 mo ago at [facility] [place]    Prostate health Lab Results  Component Value Date   PSA 0.91 02/04/2023   No voiding changes  On testosterone  treatment with urology     Colon cancer screening  Cologuard neg 08/2021    Bone health   Falls-none  Fractures-none  Supplements  B complex    Exercise  Some gym /not enough  Physical job  Lot of steps   Need more strength training  Has some stuff at home   [facility] derm care  Skyrizi     Mood    02/16/2024    4:04 PM 05/04/2023    8:52 AM 01/05/2023    3:34 PM 12/09/2021    3:28 PM 06/27/2019    3:23 PM  Depression screen PHQ 2/9  Decreased Interest 0 0 0 0 0  Down, Depressed, Hopeless 0 0 0 0 0  PHQ - 2 Score 0 0 0 0 0  Altered sleeping 0 0 0 0 0  Tired, decreased energy 0 1 0 2 1  Change in appetite 0 0 1 0 0  Feeling bad or failure about yourself  0 0 0 0 0  Trouble  concentrating 0 0 0 0 2  Moving slowly or fidgety/restless 0 0 0 0 0  Suicidal thoughts 0 0 0 0 0  PHQ-9 Score 0 1  1  2  3    Difficult doing work/chores Not difficult at all Not difficult at all Not difficult at all       Data saved with a previous flowsheet row definition   ADHD Adderall 20 mg bid  Doig well with this /stable   HTN bp is stable today  No cp or palpitations or headaches or edema  No side effects to medicines  BP Readings from Last 3 Encounters:  02/16/24 129/70  11/30/23 118/80  11/23/23 109/65     Lab Results  Component Value Date   NA 139 11/30/2023   K 5.0 11/30/2023   CO2 30 11/30/2023   GLUCOSE 98 11/30/2023   BUN 8 11/30/2023   CREATININE 1.08 11/30/2023   CALCIUM  9.2 11/30/2023   GFR 97.75 01/25/2023   EGFR 85 11/30/2023   GFRNONAA >60 09/09/2019   Metoprolol  25 mg bid   DM2 Under endocrinology care  Jardiance  25 mg  daily  Metforimn xr 2000 mg daily  Mounjaro  10 mg weekly   Hyperlipidemia  Lab Results  Component Value Date   CHOL 90 12/29/2022   HDL 28.60 (L) 12/29/2022   LDLCALC 40 12/29/2022   LDLDIRECT 90.0 06/27/2019   TRIG 104.0 12/29/2022   CHOLHDL 3 12/29/2022   Atorvastatin  80 mg daily  With CAD  Patient Active Problem List   Diagnosis Date Noted   Prostate cancer screening 02/16/2024   Low testosterone  01/14/2023   Colon cancer screening 12/09/2021   Type 2 diabetes mellitus in patient with obesity (HCC) 11/18/2021   Hypertension associated with diabetes (HCC) 08/16/2020   Class 1 obesity due to excess calories with serious comorbidity and body mass index (BMI) of 34.0 to 34.9 in adult 04/17/2020   Coronary artery disease involving native coronary artery of native heart without angina pectoris 04/17/2020   Routine general medical examination at a health care facility 01/26/2018   ADHD (attention deficit hyperactivity disorder) 04/08/2016   Hyperlipidemia associated with type 2 diabetes mellitus (HCC) 04/18/2014    Psoriasis 04/18/2014   Past Medical History:  Diagnosis Date   CAD (coronary artery disease)    Diabetes mellitus without complication (HCC)    Elevated cholesterol    Psoriasis    Past Surgical History:  Procedure Laterality Date   CORONARY STENT INTERVENTION N/A 09/08/2019   Procedure: CORONARY STENT INTERVENTION;  Surgeon: Mady Bruckner, MD;  Location: [facility] INVASIVE CV LAB;  Service: Cardiovascular;  Laterality: N/A;   LEFT HEART CATH AND CORONARY ANGIOGRAPHY N/A 09/08/2019   Procedure: LEFT HEART CATH AND CORONARY ANGIOGRAPHY;  Surgeon: Mady Bruckner, MD;  Location: [facility] INVASIVE CV LAB;  Service: Cardiovascular;  Laterality: N/A;   SHOULDER SURGERY Right    Social History   Tobacco Use   Smoking status: Former    Current packs/day: 0.00    Average packs/day: 0.3 packs/day for 10.0 years (2.5 ttl pk-yrs)    Types: Cigarettes    Start date: 04/17/1992    Quit date: 04/17/2002    Years since quitting: 21.8    Passive exposure: Never   Smokeless tobacco: Never  Vaping Use   Vaping status: Never Used  Substance Use Topics   Alcohol use: No    Alcohol/week: 0.0 standard drinks of alcohol   Drug use: No   Family History  Problem Relation Age of Onset   Diabetes Mother    Healthy Father    Healthy Brother    Diabetes Paternal Grandfather    Hypertension Paternal Grandfather    Hypertension Daughter    Thyroid  disease Daughter    Heart disease Neg Hx    Cancer Neg Hx    No Known Allergies Current Outpatient Medications on File Prior to Visit  Medication Sig Dispense Refill   amphetamine -dextroamphetamine  (ADDERALL) 20 MG tablet Take 1 tablet (20 mg total) by mouth 2 (two) times daily. 60 tablet 0   aspirin  EC 81 MG tablet Take 81 mg by mouth daily. Swallow whole.     atorvastatin  (LIPITOR ) 80 MG tablet TAKE 1 TABLET BY MOUTH DAILY 90 tablet 0   b complex vitamins capsule Take 1 capsule by mouth daily.     clobetasol (TEMOVATE) 0.05 % external solution Apply  topically as needed (psoriasis).     empagliflozin  (JARDIANCE ) 25 MG TABS tablet TAKE 1 TABLET BY MOUTH DAILY BEFORE BREAKFAST 90 tablet 4   famotidine  (PEPCID ) 20 MG tablet Take 1 tablet (20 mg total) by mouth 2 (two) times daily. 180  tablet 0   glucose blood test strip Use OneTouch Ultra test strips as instructed to check blood sugar 4 times daily. DX:E11.65 150 each 2   metFORMIN  (GLUCOPHAGE -XR) 500 MG 24 hr tablet TAKE FOUR TABLETS BY MOUTH DAILY 360 tablet 3   metoprolol  tartrate (LOPRESSOR ) 25 MG tablet TAKE 1 TABLET BY MOUTH 2 TIMES A DAY 180 tablet 3   nitroGLYCERIN  (NITROSTAT ) 0.4 MG SL tablet Place 1 tablet (0.4 mg total) under the tongue every 5 (five) minutes as needed for chest pain. 20 tablet 12   SKYRIZI  PEN 150 MG/ML pen INJECT 1 PEN SUBCUTANEOUSLY  EVERY 3 MONTHS 1 mL 0   testosterone  cypionate (DEPOTESTOSTERONE CYPIONATE) 200 MG/ML injection Inject 200 mg into the muscle every 7 (seven) days.     tirzepatide  (MOUNJARO ) 10 MG/0.5ML Pen Inject 10 mg into the skin once a week. 6 mL 4   No current facility-administered medications on file prior to visit.    Review of Systems  Constitutional:  Negative for activity change, appetite change, fatigue, fever and unexpected weight change.  HENT:  Negative for congestion, rhinorrhea, sore throat and trouble swallowing.   Eyes:  Negative for pain, redness, itching and visual disturbance.  Respiratory:  Negative for cough, chest tightness, shortness of breath and wheezing.   Cardiovascular:  Negative for chest pain and palpitations.  Gastrointestinal:  Negative for abdominal pain, blood in stool, constipation, diarrhea and nausea.  Endocrine: Negative for cold intolerance, heat intolerance, polydipsia and polyuria.  Genitourinary:  Negative for difficulty urinating, dysuria, frequency and urgency.  Musculoskeletal:  Negative for arthralgias, joint swelling and myalgias.  Skin:  Negative for pallor and rash.  Neurological:  Negative for  dizziness, tremors, weakness, numbness and headaches.  Hematological:  Negative for adenopathy. Does not bruise/bleed easily.  Psychiatric/Behavioral:  Negative for decreased concentration and dysphoric mood. The patient is not nervous/anxious.        Objective:   Physical Exam Constitutional:      General: He is not in acute distress.    Appearance: Normal appearance. He is well-developed. He is obese. He is not ill-appearing or diaphoretic.  HENT:     Head: Normocephalic and atraumatic.     Right Ear: Tympanic membrane, ear canal and external ear normal.     Left Ear: Tympanic membrane, ear canal and external ear normal.     Nose: Nose normal. No congestion.     Mouth/Throat:     Mouth: Mucous membranes are moist.     Pharynx: Oropharynx is clear. No posterior oropharyngeal erythema.  Eyes:     General: No scleral icterus.       Right eye: No discharge.        Left eye: No discharge.     Conjunctiva/sclera: Conjunctivae normal.     Pupils: Pupils are equal, round, and reactive to light.  Neck:     Thyroid : No thyromegaly.     Vascular: No carotid bruit or JVD.  Cardiovascular:     Rate and Rhythm: Normal rate and regular rhythm.     Pulses: Normal pulses.     Heart sounds: Normal heart sounds.     No gallop.  Pulmonary:     Effort: Pulmonary effort is normal. No respiratory distress.     Breath sounds: Normal breath sounds. No wheezing or rales.     Comments: Good air exch Chest:     Chest wall: No tenderness.  Abdominal:     General: Bowel sounds are normal. There is  no distension or abdominal bruit.     Palpations: Abdomen is soft. There is no mass.     Tenderness: There is no abdominal tenderness.     Hernia: No hernia is present.  Musculoskeletal:        General: No tenderness.     Cervical back: Normal range of motion and neck supple. No rigidity. No muscular tenderness.     Right lower leg: No edema.     Left lower leg: No edema.  Lymphadenopathy:      Cervical: No cervical adenopathy.  Skin:    General: Skin is warm and dry.     Coloration: Skin is not pale.     Findings: No erythema or rash.     Comments: Solar lentigines diffusely Ruddy complexion   Neurological:     Mental Status: He is alert.     Cranial Nerves: No cranial nerve deficit.     Motor: No abnormal muscle tone.     Coordination: Coordination normal.     Gait: Gait normal.     Deep Tendon Reflexes: Reflexes are normal and symmetric. Reflexes normal.  Psychiatric:        Mood and Affect: Mood normal.        Cognition and Memory: Cognition and memory normal.           Assessment & Plan:   Problem List Items Addressed This Visit       Cardiovascular and Mediastinum   Hypertension associated with diabetes (HCC)   bp in fair control at this time  BP Readings from Last 1 Encounters:  02/16/24 129/70   No changes needed Most recent labs reviewed  Disc lifstyle change with low sodium diet and exercise  Doing well with metoprolol  25 mg bid  Enc him to get back on better diet       Relevant Orders   Hepatic function panel   Lipid panel   TSH     Endocrine   Type 2 diabetes mellitus in patient with obesity (HCC)   Under endocrinology care Very well controlled Jardiance  , metformin  xr and moujaro  Continues to work on weight loss       Hyperlipidemia associated with type 2 diabetes mellitus (HCC)   Disc goals for lipids and reasons to control them Rev last labs with pt Rev low sat fat diet in detail  Labs today Continues atorvastatin  80 mg daily  Chronic low HDL-encouraged more exercise       Relevant Orders   Hepatic function panel   Lipid panel     Other   Routine general medical examination at a health care facility - Primary   Reviewed health habits including diet and exercise and skin cancer prevention Reviewed appropriate screening tests for age  Also reviewed health mt list, fam hx and immunization status , as well as social and  family history   See HPI Labs reviewed and ordered Health Maintenance  Topic Date Due   Hepatitis B Vaccine (1 of 3 - 19+ 3-dose series) Never done   Pneumococcal Vaccine (2 of 2 - PCV) 09/28/2015   Colon Cancer Screening  Never done   Eye exam for diabetics  12/14/2023   Flu Shot  06/13/2024*   Hemoglobin A1C  04/21/2024   Yearly kidney health urinalysis for diabetes  10/19/2024   Complete foot exam   10/19/2024   Yearly kidney function blood test for diabetes  11/29/2024   DTaP/Tdap/Td vaccine (2 - Td or Tdap) 04/08/2026  Hepatitis C Screening  Completed   HIV Screening  Completed   HPV Vaccine  Aged Out   Meningitis B Vaccine  Aged Out   COVID-19 Vaccine  Discontinued  *Topic was postponed. The date shown is not the original due date.    Declines flu shot  Sent for eye exam report Psa ordered  Discussed fall prevention, supplements and exercise for bone density  PHQ 0       Prostate cancer screening   Psa ordered No voiding changes On testosterone  supplementation       Relevant Orders   PSA   Low testosterone    Continues treatment with [facility]      Colon cancer screening   Cologuard negative 08/2021 Pt will reach out in June if he wants to re order of earlier if he decides to get a colonoscopy       Class 1 obesity due to excess calories with serious comorbidity and body mass index (BMI) of 34.0 to 34.9 in adult   Discussed how this problem influences overall health and the risks it imposes  Reviewed plan for weight loss with lower calorie diet (via better food choices (lower glycemic and portion control) along with exercise building up to or more than 30 minutes 5 days per week including some aerobic activity and strength training   Commended on weight loss so far Continues mounjaro  for dm2       ADHD (attention deficit hyperactivity disorder)   Does well with adderall 20 mg bid prn on days he needs it  Tolerates well

## 2024-02-16 NOTE — Assessment & Plan Note (Signed)
 Does well with adderall 20 mg bid prn on days he needs it  Tolerates well

## 2024-02-16 NOTE — Assessment & Plan Note (Signed)
 Psa ordered No voiding changes On testosterone  supplementation

## 2024-02-16 NOTE — Assessment & Plan Note (Signed)
 Disc goals for lipids and reasons to control them Rev last labs with pt Rev low sat fat diet in detail  Labs today Continues atorvastatin  80 mg daily  Chronic low HDL-encouraged more exercise

## 2024-02-16 NOTE — Assessment & Plan Note (Signed)
 bp in fair control at this time  BP Readings from Last 1 Encounters:  02/16/24 129/70   No changes needed Most recent labs reviewed  Disc lifstyle change with low sodium diet and exercise  Doing well with metoprolol  25 mg bid  Enc him to get back on better diet

## 2024-02-16 NOTE — Patient Instructions (Addendum)
 You are due for cologuard in June  Reach out for an order then  Or a colonoscopy if you prefer   Consider getting some vitamin D3 Take 1000 international units daily   Add as much strength training as you can  Home or gym Weights/machines/ floor work   Labs today pending   Don't wait too long to get a flu shot

## 2024-02-16 NOTE — Assessment & Plan Note (Signed)
 Cologuard negative 08/2021 Pt will reach out in June if he wants to re order of earlier if he decides to get a colonoscopy

## 2024-02-16 NOTE — Assessment & Plan Note (Signed)
 Reviewed health habits including diet and exercise and skin cancer prevention Reviewed appropriate screening tests for age  Also reviewed health mt list, fam hx and immunization status , as well as social and family history   See HPI Labs reviewed and ordered Health Maintenance  Topic Date Due   Hepatitis B Vaccine (1 of 3 - 19+ 3-dose series) Never done   Pneumococcal Vaccine (2 of 2 - PCV) 09/28/2015   Colon Cancer Screening  Never done   Eye exam for diabetics  12/14/2023   Flu Shot  06/13/2024*   Hemoglobin A1C  04/21/2024   Yearly kidney health urinalysis for diabetes  10/19/2024   Complete foot exam   10/19/2024   Yearly kidney function blood test for diabetes  11/29/2024   DTaP/Tdap/Td vaccine (2 - Td or Tdap) 04/08/2026   Hepatitis C Screening  Completed   HIV Screening  Completed   HPV Vaccine  Aged Out   Meningitis B Vaccine  Aged Out   COVID-19 Vaccine  Discontinued  *Topic was postponed. The date shown is not the original due date.    Declines flu shot  Sent for eye exam report Psa ordered  Discussed fall prevention, supplements and exercise for bone density  PHQ 0

## 2024-02-16 NOTE — Assessment & Plan Note (Signed)
 Continues treatment with [facility]

## 2024-02-17 ENCOUNTER — Ambulatory Visit: Payer: Self-pay | Admitting: Family Medicine

## 2024-02-17 LAB — HEPATIC FUNCTION PANEL
ALT: 6 U/L (ref 0–53)
AST: 12 U/L (ref 0–37)
Albumin: 4.2 g/dL (ref 3.5–5.2)
Alkaline Phosphatase: 60 U/L (ref 39–117)
Bilirubin, Direct: 0.2 mg/dL (ref 0.0–0.3)
Total Bilirubin: 0.8 mg/dL (ref 0.2–1.2)
Total Protein: 6.3 g/dL (ref 6.0–8.3)

## 2024-02-17 LAB — LIPID PANEL
Cholesterol: 85 mg/dL (ref 0–200)
HDL: 28 mg/dL — ABNORMAL LOW (ref >39.00)
LDL Cholesterol: 36 mg/dL (ref 0–99)
NonHDL: 56.69
Total CHOL/HDL Ratio: 3
Triglycerides: 102 mg/dL (ref 0.0–149.0)
VLDL: 20.4 mg/dL (ref 0.0–40.0)

## 2024-02-17 LAB — PSA: PSA: 1.08 ng/mL (ref 0.10–4.00)

## 2024-02-17 LAB — TSH: TSH: 1 u[IU]/mL (ref 0.35–5.50)

## 2024-02-21 ENCOUNTER — Ambulatory Visit: Admitting: Endocrinology

## 2024-02-21 ENCOUNTER — Encounter: Payer: Self-pay | Admitting: Endocrinology

## 2024-02-21 ENCOUNTER — Ambulatory Visit: Payer: Self-pay | Admitting: Endocrinology

## 2024-02-21 VITALS — BP 136/85 | HR 74 | Resp 18 | Ht 73.0 in | Wt 227.8 lb

## 2024-02-21 DIAGNOSIS — E1169 Type 2 diabetes mellitus with other specified complication: Secondary | ICD-10-CM

## 2024-02-21 DIAGNOSIS — Z7985 Long-term (current) use of injectable non-insulin antidiabetic drugs: Secondary | ICD-10-CM

## 2024-02-21 DIAGNOSIS — Z7984 Long term (current) use of oral hypoglycemic drugs: Secondary | ICD-10-CM

## 2024-02-21 DIAGNOSIS — E118 Type 2 diabetes mellitus with unspecified complications: Secondary | ICD-10-CM

## 2024-02-21 LAB — POCT GLYCOSYLATED HEMOGLOBIN (HGB A1C): Hemoglobin A1C: 5.8 % — AB (ref 4.0–5.6)

## 2024-02-21 MED ORDER — METFORMIN HCL ER 500 MG PO TB24: 2000.0000 mg | ORAL_TABLET | Freq: Every day | ORAL | 3 refills | Status: AC

## 2024-02-21 NOTE — Progress Notes (Signed)
 Outpatient Endocrinology Note Todd Buckbee, MD  02/21/24  Patient's Name: Todd Gaines    DOB: 1976-01-25    MRN: [id number]                                                    REASON OF VISIT: Follow up of type 2 diabetes mellitus  PCP: Tower, Laine LABOR, MD  HISTORY OF PRESENT ILLNESS:   Todd Gaines is a 48 y.o. old male with past medical history listed below, is here for follow up for type 2 diabetes mellitus.   Pertinent Diabetes History: Patient was diagnosed with type 2 diabetes mellitus in 2012.  He was started on insulin  therapy in 2016.  Chronic Diabetes Complications : Retinopathy: yes, mild non proliferative. Last ophthalmology exam was done on annually, following with ophthalmology regularly.  Nephropathy: no Peripheral neuropathy: on Coronary artery disease: yes Stroke: no  Relevant comorbidities and cardiovascular risk factors: Obesity: yes Body mass index is 30.05 kg/m.  Hypertension: Yes  Hyperlipidemia : Yes, on statin   Current / Home Diabetic regimen includes: Metformin  extended release 2000 mg daily. Mounjaro  10 mg weekly.   Jardiance  25 mg daily.  Prior diabetic medications: V-Go pump stopped due to cost issue.  Basal bolus regimen.  Used to be on OmniPod insulin  pump in the past.  Trulicity  in the past, was switched to Mounjaro  in April 2024.  Transient nausea from Trulicity .  Glycemic data:   He has not been checking blood sugar at home.  Hypoglycemia: Patient has denies hypoglycemic episodes. Patient has hypoglycemia awareness.  Factors modifying glucose control: 1.  Diabetic diet assessment: 3 meals a day, occasionally eating [facility].  2.  Staying active or exercising: No formal exercise.  3.  Medication compliance: compliant most of the time.  # Low testosterone  : On discharge on supplement, ? following with urology. He had low testosterone  of 232 with low normal limit of 300 in October as follows, but checked around 2 PM in the  afternoon.  This was checked due to fatigue.  Patient has mild decline in libido and also has mild erectile dysfunction.  He has daughter age of 15 in 2024.  No history of opioid intake.  No history of taking androgen /steroid for muscle building.  No history of head trauma or testicular trauma.  In November 2024, repeat testosterone  level in the morning including total, free testosterone  and creatinine and prolactin were within normal limits as follows.    Interval history  Hemoglobin A1c 5.8% today.  He has been taking Mounjaro , tolerating well and including other diabetic medication.  Lately has been taking 3 tablets of metformin .  He lost about 10 pounds of weight since last visit.  He reports he was less by 10 pound before Thanksgiving.  No numbness and tingling to feet.  No vision problem.  He has no other complaints today.  REVIEW OF SYSTEMS As per history of present illness.   PAST MEDICAL HISTORY: Past Medical History:  Diagnosis Date   CAD (coronary artery disease)    Diabetes mellitus without complication (HCC)    Elevated cholesterol    Psoriasis     PAST SURGICAL HISTORY: Past Surgical History:  Procedure Laterality Date   CORONARY STENT INTERVENTION N/A 09/08/2019   Procedure: CORONARY STENT INTERVENTION;  Surgeon: Mady Bruckner, MD;  Location: [facility] INVASIVE CV LAB;  Service: Cardiovascular;  Laterality: N/A;   LEFT HEART CATH AND CORONARY ANGIOGRAPHY N/A 09/08/2019   Procedure: LEFT HEART CATH AND CORONARY ANGIOGRAPHY;  Surgeon: Mady Bruckner, MD;  Location: [facility] INVASIVE CV LAB;  Service: Cardiovascular;  Laterality: N/A;   SHOULDER SURGERY Right     ALLERGIES: No Known Allergies  FAMILY HISTORY:  Family History  Problem Relation Age of Onset   Diabetes Mother    Healthy Father    Healthy Brother    Diabetes Paternal Grandfather    Hypertension Paternal Grandfather    Hypertension Daughter    Thyroid  disease Daughter    Heart disease Neg Hx    Cancer Neg  Hx     SOCIAL HISTORY: Social History   Socioeconomic History   Marital status: Married    Spouse name: Not on file   Number of children: Not on file   Years of education: Not on file   Highest education level: Not on file  Occupational History   Not on file  Tobacco Use   Smoking status: Former    Current packs/day: 0.00    Average packs/day: 0.3 packs/day for 10.0 years (2.5 ttl pk-yrs)    Types: Cigarettes    Start date: 04/17/1992    Quit date: 04/17/2002    Years since quitting: 21.8    Passive exposure: Never   Smokeless tobacco: Never  Vaping Use   Vaping status: Never Used  Substance and Sexual Activity   Alcohol use: No    Alcohol/week: 0.0 standard drinks of alcohol   Drug use: No   Sexual activity: Yes  Other Topics Concern   Not on file  Social History Narrative   Not on file   Social Drivers of Health   Financial Resource Strain: Not on file  Food Insecurity: Not on file  Transportation Needs: Not on file  Physical Activity: Not on file  Stress: Not on file  Social Connections: Not on file    MEDICATIONS:  Current Outpatient Medications  Medication Sig Dispense Refill   amphetamine -dextroamphetamine  (ADDERALL) 20 MG tablet Take 1 tablet (20 mg total) by mouth 2 (two) times daily. 60 tablet 0   aspirin  EC 81 MG tablet Take 81 mg by mouth daily. Swallow whole.     atorvastatin  (LIPITOR ) 80 MG tablet TAKE 1 TABLET BY MOUTH DAILY 90 tablet 0   b complex vitamins capsule Take 1 capsule by mouth daily.     clobetasol (TEMOVATE) 0.05 % external solution Apply topically as needed (psoriasis).     empagliflozin  (JARDIANCE ) 25 MG TABS tablet TAKE 1 TABLET BY MOUTH DAILY BEFORE BREAKFAST 90 tablet 4   famotidine  (PEPCID ) 20 MG tablet Take 1 tablet (20 mg total) by mouth 2 (two) times daily. 180 tablet 0   glucose blood test strip Use OneTouch Ultra test strips as instructed to check blood sugar 4 times daily. DX:E11.65 150 each 2   metoprolol  tartrate  (LOPRESSOR ) 25 MG tablet TAKE 1 TABLET BY MOUTH 2 TIMES A DAY 180 tablet 3   nitroGLYCERIN  (NITROSTAT ) 0.4 MG SL tablet Place 1 tablet (0.4 mg total) under the tongue every 5 (five) minutes as needed for chest pain. 20 tablet 12   SKYRIZI  PEN 150 MG/ML pen INJECT 1 PEN SUBCUTANEOUSLY  EVERY 3 MONTHS 1 mL 0   testosterone  cypionate (DEPOTESTOSTERONE CYPIONATE) 200 MG/ML injection Inject 200 mg into the muscle every 7 (seven) days.     tirzepatide  (MOUNJARO ) 10 MG/0.5ML Pen Inject  10 mg into the skin once a week. 6 mL 4   metFORMIN  (GLUCOPHAGE -XR) 500 MG 24 hr tablet Take 4 tablets (2,000 mg total) by mouth daily. 360 tablet 3   No current facility-administered medications for this visit.    PHYSICAL EXAM: Vitals:   02/21/24 1559  BP: 136/85  Pulse: 74  Resp: 18  SpO2: 98%  Weight: 227 lb 12.8 oz (103.3 kg)  Height: 6' 1 (1.854 m)    Body mass index is 30.05 kg/m.  Wt Readings from Last 3 Encounters:  02/21/24 227 lb 12.8 oz (103.3 kg)  02/16/24 226 lb (102.5 kg)  11/30/23 237 lb 6.4 oz (107.7 kg)    General: Well developed, well nourished male in no apparent distress.  HEENT: AT/[place], no external lesions.  Eyes: Conjunctiva clear and no icterus. Neck: Neck supple  Lungs: Respirations not labored Neurologic: Alert, oriented, normal speech Extremities / Skin: Dry.   Psychiatric: Does not appear depressed or anxious  Diabetic Foot Exam - Simple   No data filed    LABS Reviewed Lab Results  Component Value Date   HGBA1C 5.8 (A) 02/21/2024   HGBA1C 5.9 (A) 10/20/2023   HGBA1C 6.1 (A) 05/31/2023   Lab Results  Component Value Date   FRUCTOSAMINE 258 08/30/2019   FRUCTOSAMINE 400 (H) 04/20/2018   FRUCTOSAMINE 322 (H) 12/16/2015   Lab Results  Component Value Date   CHOL 85 02/16/2024   HDL 28.00 (L) 02/16/2024   LDLCALC 36 02/16/2024   LDLDIRECT 90.0 06/27/2019   TRIG 102.0 02/16/2024   CHOLHDL 3 02/16/2024   Lab Results  Component Value Date   MICRALBCREAT 4  10/20/2023    Lab Results  Component Value Date   CREATININE 1.08 11/30/2023   Lab Results  Component Value Date   GFR 97.75 01/25/2023    ASSESSMENT / PLAN  1. Controlled type 2 diabetes mellitus with complication, without long-term current use of insulin  (HCC)   2. Type 2 diabetes mellitus with other specified complication, without long-term current use of insulin  (HCC)    Diabetes Mellitus type 2, complicated by diabetic retinopathy. - Diabetic status / severity: Controlled.  Lab Results  Component Value Date   HGBA1C 5.8 (A) 02/21/2024    - Hemoglobin A1c goal : <6.5%  - Medications: No change.  Metformin  extended release 2000 mg daily.  Okay to take 3 tablets daily. Mounjaro   10 mg weekly. Jardiance  25 mg daily  - Home glucose testing: Advised to check at least in the morning fasting, few times a week. - Discussed/ Gave Hypoglycemia treatment plan.  # Consult : not required at this time.   # Annual urine for microalbuminuria/ creatinine ratio, no microalbuminuria currently.   Last  Lab Results  Component Value Date   MICRALBCREAT 4 10/20/2023     # Foot check nightly.  # He has diabetic retinopathy, follow-up with ophthalmology..   - Diet: Make healthy diabetic food choices.   - Life style / activity / exercise: Discussed.  2. Blood pressure  -  BP Readings from Last 1 Encounters:  02/21/24 136/85    - Control is in target.  - No change in current plans.  3. Lipid status / Hyperlipidemia - Last  Lab Results  Component Value Date   LDLCALC 36 02/16/2024   - Continue atorvastatin  80 mg daily.  Will change endocrinology follow-up for every 6 months.  Darvin was seen today for diabetes.  Diagnoses and all orders for this visit:  Controlled  type 2 diabetes mellitus with complication, without long-term current use of insulin  (HCC) -     POCT glycosylated hemoglobin (Hb A1C)  Type 2 diabetes mellitus with other specified complication,  without long-term current use of insulin  (HCC) -     metFORMIN  (GLUCOPHAGE -XR) 500 MG 24 hr tablet; Take 4 tablets (2,000 mg total) by mouth daily.    DISPOSITION Follow up in clinic in 6 months suggested.   All questions answered and patient verbalized understanding of the plan.  Javion Holmer, MD [facility] Endocrinology [facility] [address] [place], [address] [place], [place] [address] Phone # [phone]  At least part of this note was generated using voice recognition software. Inadvertent word errors may have occurred, which were not recognized during the proofreading process.

## 2024-03-08 ENCOUNTER — Other Ambulatory Visit: Payer: Self-pay | Admitting: Internal Medicine

## 2024-03-28 ENCOUNTER — Other Ambulatory Visit: Payer: Self-pay | Admitting: Physician Assistant

## 2024-03-28 ENCOUNTER — Other Ambulatory Visit: Payer: Self-pay

## 2024-03-28 DIAGNOSIS — L4050 Arthropathic psoriasis, unspecified: Secondary | ICD-10-CM

## 2024-03-28 DIAGNOSIS — Z79899 Other long term (current) drug therapy: Secondary | ICD-10-CM

## 2024-03-28 DIAGNOSIS — L409 Psoriasis, unspecified: Secondary | ICD-10-CM

## 2024-03-28 NOTE — Telephone Encounter (Signed)
 Last Fill: 01/10/2024  Labs: 11/30/2023 RBC count is elevated-6.09 and MCH is borderline low but stable.  Rest of CBC WNL. CMP WNL   TB Gold: 08/05/2023 Neg    Next Visit: 05/02/2024  Last Visit: 11/30/2023  IK:Ednmpjupr arthritis   Current Dose per office note 11/30/2023: Skyrizi  150 mg sq injections once every 12 weeks   Patient advised he is due to update his lab work. Patient states he will come to the office one day this week to update.   Okay to refill Skyrizi ?

## 2024-03-29 ENCOUNTER — Ambulatory Visit: Payer: Self-pay | Admitting: Rheumatology

## 2024-03-29 LAB — CBC WITH DIFFERENTIAL/PLATELET
Absolute Lymphocytes: 1510 {cells}/uL (ref 850–3900)
Absolute Monocytes: 461 {cells}/uL (ref 200–950)
Basophils Absolute: 38 {cells}/uL (ref 0–200)
Basophils Relative: 0.6 %
Eosinophils Absolute: 314 {cells}/uL (ref 15–500)
Eosinophils Relative: 4.9 %
HCT: 48.8 % (ref 39.4–51.1)
Hemoglobin: 16.1 g/dL (ref 13.2–17.1)
MCH: 27.6 pg (ref 27.0–33.0)
MCHC: 33 g/dL (ref 31.6–35.4)
MCV: 83.7 fL (ref 81.4–101.7)
MPV: 9.9 fL (ref 7.5–12.5)
Monocytes Relative: 7.2 %
Neutro Abs: 4077 {cells}/uL (ref 1500–7800)
Neutrophils Relative %: 63.7 %
Platelets: 249 Thousand/uL (ref 140–400)
RBC: 5.83 Million/uL — ABNORMAL HIGH (ref 4.20–5.80)
RDW: 14.5 % (ref 11.0–15.0)
Total Lymphocyte: 23.6 %
WBC: 6.4 Thousand/uL (ref 3.8–10.8)

## 2024-03-29 LAB — COMPREHENSIVE METABOLIC PANEL WITH GFR
AG Ratio: 1.8 (calc) (ref 1.0–2.5)
ALT: 11 U/L (ref 9–46)
AST: 12 U/L (ref 10–40)
Albumin: 4.3 g/dL (ref 3.6–5.1)
Alkaline phosphatase (APISO): 80 U/L (ref 36–130)
BUN: 12 mg/dL (ref 7–25)
CO2: 31 mmol/L (ref 20–32)
Calcium: 9 mg/dL (ref 8.6–10.3)
Chloride: 101 mmol/L (ref 98–110)
Creat: 1.01 mg/dL (ref 0.60–1.29)
Globulin: 2.4 g/dL (ref 1.9–3.7)
Glucose, Bld: 106 mg/dL — ABNORMAL HIGH (ref 65–99)
Potassium: 4.6 mmol/L (ref 3.5–5.3)
Sodium: 139 mmol/L (ref 135–146)
Total Bilirubin: 0.9 mg/dL (ref 0.2–1.2)
Total Protein: 6.7 g/dL (ref 6.1–8.1)
eGFR: 92 mL/min/1.73m2

## 2024-04-17 ENCOUNTER — Other Ambulatory Visit: Payer: Self-pay | Admitting: Family Medicine

## 2024-04-17 DIAGNOSIS — R1319 Other dysphagia: Secondary | ICD-10-CM

## 2024-04-18 NOTE — Progress Notes (Unsigned)
 "  Office Visit Note  Patient: Todd Gaines             Date of Birth: 01/11/1976           MRN: [id number]             PCP: Randeen Laine LABOR, MD Referring: Tower, Laine LABOR, MD Visit Date: 05/02/2024 Occupation: Data Unavailable  Subjective:  No chief complaint on file.   History of Present Illness: Todd Gaines is a 49 y.o. male ***     Activities of Daily Living:  Patient reports morning stiffness for *** {minute/hour:19697}.   Patient {ACTIONS;DENIES/REPORTS:21021675::Denies} nocturnal pain.  Difficulty dressing/grooming: {ACTIONS;DENIES/REPORTS:21021675::Denies} Difficulty climbing stairs: {ACTIONS;DENIES/REPORTS:21021675::Denies} Difficulty getting out of chair: {ACTIONS;DENIES/REPORTS:21021675::Denies} Difficulty using hands for taps, buttons, cutlery, and/or writing: {ACTIONS;DENIES/REPORTS:21021675::Denies}  No Rheumatology ROS completed.   PMFS History:  Patient Active Problem List   Diagnosis Date Noted   Prostate cancer screening 02/16/2024   Low testosterone  01/14/2023   Colon cancer screening 12/09/2021   Type 2 diabetes mellitus in patient with obesity (HCC) 11/18/2021   Hypertension associated with diabetes (HCC) 08/16/2020   Class 1 obesity due to excess calories with serious comorbidity and body mass index (BMI) of 34.0 to 34.9 in adult 04/17/2020   Coronary artery disease involving native coronary artery of native heart without angina pectoris 04/17/2020   Routine general medical examination at a health care facility 01/26/2018   ADHD (attention deficit hyperactivity disorder) 04/08/2016   Hyperlipidemia associated with type 2 diabetes mellitus (HCC) 04/18/2014   Psoriasis 04/18/2014    Past Medical History:  Diagnosis Date   CAD (coronary artery disease)    Diabetes mellitus without complication (HCC)    Elevated cholesterol    Psoriasis     Family History  Problem Relation Age of Onset   Diabetes Mother    Healthy Father    Healthy  Brother    Diabetes Paternal Grandfather    Hypertension Paternal Grandfather    Hypertension Daughter    Thyroid  disease Daughter    Heart disease Neg Hx    Cancer Neg Hx    Past Surgical History:  Procedure Laterality Date   CORONARY STENT INTERVENTION N/A 09/08/2019   Procedure: CORONARY STENT INTERVENTION;  Surgeon: Mady Bruckner, MD;  Location: [facility] INVASIVE CV LAB;  Service: Cardiovascular;  Laterality: N/A;   LEFT HEART CATH AND CORONARY ANGIOGRAPHY N/A 09/08/2019   Procedure: LEFT HEART CATH AND CORONARY ANGIOGRAPHY;  Surgeon: Mady Bruckner, MD;  Location: [facility] INVASIVE CV LAB;  Service: Cardiovascular;  Laterality: N/A;   SHOULDER SURGERY Right    Social History[1] Social History   Social History Narrative   Not on file     Immunization History  Administered Date(s) Administered   Influenza,inj,Quad PF,6+ Mos 04/18/2014, 01/10/2015, 12/06/2017, 02/13/2020, 12/31/2020   Influenza-Unspecified 11/29/2015   Moderna Sars-Covid-2 Vaccination 05/26/2019, 06/26/2019   Pneumococcal Polysaccharide-23 09/28/2014   Tdap 04/08/2016     Objective: Vital Signs: There were no vitals taken for this visit.   Physical Exam   Musculoskeletal Exam: ***  CDAI Exam: CDAI Score: -- Patient Global: --; Provider Global: -- Swollen: --; Tender: -- Joint Exam 05/02/2024   No joint exam has been documented for this visit   There is currently no information documented on the homunculus. Go to the Rheumatology activity and complete the homunculus joint exam.  Investigation: No additional findings.  Imaging: No results found.  Recent Labs: Lab Results  Component Value Date   WBC 6.4 03/28/2024  HGB 16.1 03/28/2024   PLT 249 03/28/2024   NA 139 03/28/2024   K 4.6 03/28/2024   CL 101 03/28/2024   CO2 31 03/28/2024   GLUCOSE 106 (H) 03/28/2024   BUN 12 03/28/2024   CREATININE 1.01 03/28/2024   BILITOT 0.9 03/28/2024   ALKPHOS 60 02/16/2024   AST 12 03/28/2024    ALT 11 03/28/2024   PROT 6.7 03/28/2024   ALBUMIN 4.2 02/16/2024   CALCIUM  9.0 03/28/2024   GFRAA >60 09/09/2019   QFTBGOLDPLUS NEGATIVE 08/05/2023    Speciality Comments: Skyrizi  started 08/17/23  Procedures:  No procedures performed Allergies: Patient has no known allergies.   Assessment / Plan:     Visit Diagnoses: Psoriatic arthritis (HCC)  Psoriasis  High risk medication use  Sacroiliitis  Trochanteric bursitis of both hips  Chronic right shoulder pain  Pain in both hands  Neck pain  Chronic midline low back pain without sciatica  Coronary artery disease involving native coronary artery of native heart without angina pectoris  Hyperlipidemia associated with type 2 diabetes mellitus (HCC)  Hypertension associated with diabetes (HCC)  Gastroesophageal reflux disease with esophagitis without hemorrhage  Attention deficit hyperactivity disorder (ADHD), combined type  Orders: No orders of the defined types were placed in this encounter.  No orders of the defined types were placed in this encounter.   Face-to-face time spent with patient was *** minutes. Greater than 50% of time was spent in counseling and coordination of care.  Follow-Up Instructions: No follow-ups on file.   Waddell CHRISTELLA Craze, PA-C  Note - This record has been created using Dragon software.  Chart creation errors have been sought, but may not always  have been located. Such creation errors do not reflect on  the standard of medical care.     [1]  Social History Tobacco Use   Smoking status: Former    Current packs/day: 0.00    Average packs/day: 0.3 packs/day for 10.0 years (2.5 ttl pk-yrs)    Types: Cigarettes    Start date: 04/17/1992    Quit date: 04/17/2002    Years since quitting: 22.0    Passive exposure: Never   Smokeless tobacco: Never  Vaping Use   Vaping status: Never Used  Substance Use Topics   Alcohol use: No    Alcohol/week: 0.0 standard drinks of alcohol   Drug  use: No   "

## 2024-05-02 ENCOUNTER — Ambulatory Visit: Admitting: Physician Assistant

## 2024-05-02 DIAGNOSIS — G8929 Other chronic pain: Secondary | ICD-10-CM

## 2024-05-02 DIAGNOSIS — I2510 Atherosclerotic heart disease of native coronary artery without angina pectoris: Secondary | ICD-10-CM

## 2024-05-02 DIAGNOSIS — Z79899 Other long term (current) drug therapy: Secondary | ICD-10-CM

## 2024-05-02 DIAGNOSIS — M7061 Trochanteric bursitis, right hip: Secondary | ICD-10-CM

## 2024-05-02 DIAGNOSIS — F902 Attention-deficit hyperactivity disorder, combined type: Secondary | ICD-10-CM

## 2024-05-02 DIAGNOSIS — M79641 Pain in right hand: Secondary | ICD-10-CM

## 2024-05-02 DIAGNOSIS — M542 Cervicalgia: Secondary | ICD-10-CM

## 2024-05-02 DIAGNOSIS — L409 Psoriasis, unspecified: Secondary | ICD-10-CM

## 2024-05-02 DIAGNOSIS — L4050 Arthropathic psoriasis, unspecified: Secondary | ICD-10-CM

## 2024-05-02 DIAGNOSIS — E1169 Type 2 diabetes mellitus with other specified complication: Secondary | ICD-10-CM

## 2024-05-02 DIAGNOSIS — K2100 Gastro-esophageal reflux disease with esophagitis, without bleeding: Secondary | ICD-10-CM

## 2024-05-02 DIAGNOSIS — M461 Sacroiliitis, not elsewhere classified: Secondary | ICD-10-CM

## 2024-05-02 DIAGNOSIS — E1159 Type 2 diabetes mellitus with other circulatory complications: Secondary | ICD-10-CM

## 2024-05-23 ENCOUNTER — Ambulatory Visit (HOSPITAL_COMMUNITY)

## 2024-08-21 ENCOUNTER — Ambulatory Visit: Admitting: Endocrinology
